# Patient Record
Sex: Female | Born: 1940 | Race: White | Hispanic: No | Smoking: Never smoker
Health system: Southern US, Community
[De-identification: ages and names within clinical notes are randomized; demographics above are authoritative.]

## PROBLEM LIST (undated history)

## (undated) DIAGNOSIS — Z8669 Personal history of other diseases of the nervous system and sense organs: Secondary | ICD-10-CM

## (undated) DIAGNOSIS — M62838 Other muscle spasm: Secondary | ICD-10-CM

## (undated) DIAGNOSIS — E785 Hyperlipidemia, unspecified: Secondary | ICD-10-CM

## (undated) DIAGNOSIS — Z960 Presence of urogenital implants: Secondary | ICD-10-CM

## (undated) DIAGNOSIS — E119 Type 2 diabetes mellitus without complications: Secondary | ICD-10-CM

## (undated) DIAGNOSIS — M542 Cervicalgia: Secondary | ICD-10-CM

## (undated) DIAGNOSIS — D049 Carcinoma in situ of skin, unspecified: Secondary | ICD-10-CM

## (undated) DIAGNOSIS — Z87898 Personal history of other specified conditions: Secondary | ICD-10-CM

## (undated) DIAGNOSIS — I10 Essential (primary) hypertension: Secondary | ICD-10-CM

## (undated) DIAGNOSIS — F32A Depression, unspecified: Secondary | ICD-10-CM

## (undated) DIAGNOSIS — M8580 Other specified disorders of bone density and structure, unspecified site: Secondary | ICD-10-CM

## (undated) DIAGNOSIS — M5416 Radiculopathy, lumbar region: Secondary | ICD-10-CM

## (undated) DIAGNOSIS — D099 Carcinoma in situ, unspecified: Secondary | ICD-10-CM

## (undated) DIAGNOSIS — I2510 Atherosclerotic heart disease of native coronary artery without angina pectoris: Secondary | ICD-10-CM

## (undated) DIAGNOSIS — Z9289 Personal history of other medical treatment: Secondary | ICD-10-CM

## (undated) DIAGNOSIS — I219 Acute myocardial infarction, unspecified: Secondary | ICD-10-CM

## (undated) DIAGNOSIS — B029 Zoster without complications: Secondary | ICD-10-CM

## (undated) DIAGNOSIS — C801 Malignant (primary) neoplasm, unspecified: Secondary | ICD-10-CM

## (undated) DIAGNOSIS — G250 Essential tremor: Secondary | ICD-10-CM

## (undated) DIAGNOSIS — R634 Abnormal weight loss: Secondary | ICD-10-CM

## (undated) DIAGNOSIS — I700 Atherosclerosis of aorta: Secondary | ICD-10-CM

## (undated) DIAGNOSIS — Z8249 Family history of ischemic heart disease and other diseases of the circulatory system: Secondary | ICD-10-CM

## (undated) DIAGNOSIS — K683 Retroperitoneal hematoma: Secondary | ICD-10-CM

## (undated) DIAGNOSIS — L858 Other specified epidermal thickening: Secondary | ICD-10-CM

## (undated) DIAGNOSIS — K661 Hemoperitoneum: Secondary | ICD-10-CM

## (undated) DIAGNOSIS — K5900 Constipation, unspecified: Secondary | ICD-10-CM

## (undated) DIAGNOSIS — K222 Esophageal obstruction: Secondary | ICD-10-CM

## (undated) DIAGNOSIS — C4491 Basal cell carcinoma of skin, unspecified: Secondary | ICD-10-CM

## (undated) DIAGNOSIS — K219 Gastro-esophageal reflux disease without esophagitis: Secondary | ICD-10-CM

## (undated) DIAGNOSIS — N814 Uterovaginal prolapse, unspecified: Secondary | ICD-10-CM

## (undated) HISTORY — DX: Other muscle spasm: M62.838

## (undated) HISTORY — DX: Other specified disorders of bone density and structure, unspecified site: M85.80

## (undated) HISTORY — DX: Basal cell carcinoma of skin, unspecified: C44.91

## (undated) HISTORY — PX: CATARACT EXTRACTION, BILATERAL: SHX1313

## (undated) HISTORY — DX: Depression, unspecified: F32.A

## (undated) HISTORY — DX: Atherosclerotic heart disease of native coronary artery without angina pectoris: I25.10

## (undated) HISTORY — DX: Retroperitoneal hematoma: K68.3

## (undated) HISTORY — DX: Hemoperitoneum: K66.1

## (undated) HISTORY — DX: Hyperlipidemia, unspecified: E78.5

## (undated) HISTORY — PX: TUBAL LIGATION: SHX77

## (undated) HISTORY — PX: TONSILECTOMY/ADENOIDECTOMY WITH MYRINGOTOMY: SHX6125

## (undated) HISTORY — DX: Acute myocardial infarction, unspecified: I21.9

## (undated) HISTORY — DX: Atherosclerosis of aorta: I70.0

## (undated) HISTORY — DX: Carcinoma in situ, unspecified: D09.9

## (undated) HISTORY — DX: Radiculopathy, lumbar region: M54.16

## (undated) HISTORY — DX: Other specified epidermal thickening: L85.8

## (undated) HISTORY — DX: Presence of urogenital implants: Z96.0

## (undated) HISTORY — DX: Uterovaginal prolapse, unspecified: N81.4

## (undated) HISTORY — DX: Cervicalgia: M54.2

## (undated) HISTORY — DX: Type 2 diabetes mellitus without complications: E11.9

## (undated) HISTORY — DX: Personal history of other specified conditions: Z87.898

## (undated) HISTORY — DX: Constipation, unspecified: K59.00

## (undated) HISTORY — DX: Esophageal obstruction: K22.2

## (undated) HISTORY — DX: Abnormal weight loss: R63.4

## (undated) HISTORY — DX: Gastro-esophageal reflux disease without esophagitis: K21.9

## (undated) HISTORY — DX: Family history of ischemic heart disease and other diseases of the circulatory system: Z82.49

## (undated) HISTORY — DX: Personal history of other diseases of the nervous system and sense organs: Z86.69

## (undated) HISTORY — PX: DILATION AND CURETTAGE OF UTERUS: SHX78

## (undated) HISTORY — PX: COLONOSCOPY: SHX174

## (undated) HISTORY — DX: Carcinoma in situ of skin, unspecified: D04.9

## (undated) HISTORY — DX: Essential (primary) hypertension: I10

---

## 1993-06-24 DIAGNOSIS — C4491 Basal cell carcinoma of skin, unspecified: Secondary | ICD-10-CM

## 1993-06-24 HISTORY — DX: Basal cell carcinoma of skin, unspecified: C44.91

## 1996-07-11 DIAGNOSIS — D049 Carcinoma in situ of skin, unspecified: Secondary | ICD-10-CM

## 1996-07-11 HISTORY — DX: Carcinoma in situ of skin, unspecified: D04.9

## 2001-03-09 ENCOUNTER — Other Ambulatory Visit: Admission: RE | Admit: 2001-03-09 | Discharge: 2001-03-09 | Payer: Self-pay | Admitting: Obstetrics and Gynecology

## 2001-09-11 ENCOUNTER — Ambulatory Visit (HOSPITAL_COMMUNITY): Admission: RE | Admit: 2001-09-11 | Discharge: 2001-09-11 | Payer: Self-pay | Admitting: Family Medicine

## 2001-09-11 ENCOUNTER — Encounter: Payer: Self-pay | Admitting: Family Medicine

## 2002-03-11 ENCOUNTER — Ambulatory Visit (HOSPITAL_COMMUNITY): Admission: RE | Admit: 2002-03-11 | Discharge: 2002-03-11 | Payer: Self-pay | Admitting: Family Medicine

## 2002-03-11 ENCOUNTER — Encounter: Payer: Self-pay | Admitting: Family Medicine

## 2002-04-18 ENCOUNTER — Other Ambulatory Visit: Admission: RE | Admit: 2002-04-18 | Discharge: 2002-04-18 | Payer: Self-pay | Admitting: Obstetrics and Gynecology

## 2003-04-25 ENCOUNTER — Other Ambulatory Visit: Admission: RE | Admit: 2003-04-25 | Discharge: 2003-04-25 | Payer: Self-pay | Admitting: Obstetrics and Gynecology

## 2004-04-27 ENCOUNTER — Other Ambulatory Visit: Admission: RE | Admit: 2004-04-27 | Discharge: 2004-04-27 | Payer: Self-pay | Admitting: Obstetrics and Gynecology

## 2004-05-25 ENCOUNTER — Ambulatory Visit (HOSPITAL_COMMUNITY): Admission: RE | Admit: 2004-05-25 | Discharge: 2004-05-25 | Payer: Self-pay | Admitting: Family Medicine

## 2004-06-13 DIAGNOSIS — I219 Acute myocardial infarction, unspecified: Secondary | ICD-10-CM

## 2004-06-13 HISTORY — DX: Acute myocardial infarction, unspecified: I21.9

## 2004-11-05 ENCOUNTER — Inpatient Hospital Stay (HOSPITAL_COMMUNITY): Admission: EM | Admit: 2004-11-05 | Discharge: 2004-11-11 | Payer: Self-pay | Admitting: Emergency Medicine

## 2004-11-05 HISTORY — PX: CORONARY ANGIOPLASTY WITH STENT PLACEMENT: SHX49

## 2004-11-29 ENCOUNTER — Encounter: Admission: RE | Admit: 2004-11-29 | Discharge: 2005-02-27 | Payer: Self-pay | Admitting: Cardiology

## 2004-12-02 ENCOUNTER — Encounter (HOSPITAL_COMMUNITY): Admission: RE | Admit: 2004-12-02 | Discharge: 2005-01-01 | Payer: Self-pay | Admitting: Cardiology

## 2004-12-29 ENCOUNTER — Encounter (HOSPITAL_COMMUNITY): Admission: RE | Admit: 2004-12-29 | Discharge: 2005-03-29 | Payer: Self-pay | Admitting: Cardiology

## 2005-01-03 ENCOUNTER — Encounter (HOSPITAL_COMMUNITY): Admission: RE | Admit: 2005-01-03 | Discharge: 2005-02-02 | Payer: Self-pay | Admitting: Cardiology

## 2005-02-04 ENCOUNTER — Encounter (HOSPITAL_COMMUNITY): Admission: RE | Admit: 2005-02-04 | Discharge: 2005-03-06 | Payer: Self-pay | Admitting: Cardiology

## 2005-03-07 ENCOUNTER — Encounter (HOSPITAL_COMMUNITY): Admission: RE | Admit: 2005-03-07 | Discharge: 2005-03-11 | Payer: Self-pay | Admitting: Cardiology

## 2006-06-27 ENCOUNTER — Emergency Department (HOSPITAL_COMMUNITY): Admission: EM | Admit: 2006-06-27 | Discharge: 2006-06-27 | Payer: Self-pay | Admitting: Emergency Medicine

## 2007-02-27 ENCOUNTER — Ambulatory Visit (HOSPITAL_COMMUNITY): Admission: RE | Admit: 2007-02-27 | Discharge: 2007-02-27 | Payer: Self-pay | Admitting: Family Medicine

## 2008-11-27 ENCOUNTER — Ambulatory Visit (HOSPITAL_COMMUNITY): Admission: RE | Admit: 2008-11-27 | Discharge: 2008-11-27 | Payer: Self-pay | Admitting: Family Medicine

## 2010-10-29 NOTE — Consult Note (Signed)
[name], [name] NO.:  [id number]   MEDICAL RECORD NO.:  [id number]          PATIENT TYPE:  INP   LOCATION:  2927                         FACILITY:  [facility]   PHYSICIAN:  Di Kindle. Edilia Bo, M.D.DATE OF BIRTH:  1940-12-02   DATE OF CONSULTATION:  11/06/2004  DATE OF DISCHARGE:                                   CONSULTATION   REFERRING PHYSICIAN:  Mohan N. [name], M.D.   REASON FOR CONSULTATION:  Retroperitoneal hematoma.   HISTORY:  This is a pleasant 70 year old woman who was admitted with an  acute coronary syndrome.  She had an acute MI and was taken to the cath lab  Nov 04, 2004 underwent cardiac catheterization and PTCA via a right femoral  approach.  She later had a drop in her blood pressure and also was noted to  have a drop her hemoglobin from 12.6 to 8.8.  CT scan of the abdomen was  obtained which showed a moderate-sized right retroperitoneal hematoma.  Vascular surgery was consulted for further recommendations.   The patient does complain of some mild-to-moderate right lower quadrant pain  and also has some nausea.   PAST MEDICAL HISTORY:  Significant for hypertension and coronary artery  disease.  She denies any history of diabetes, hypercholesterolemia, history  of congestive heart failure.   PAST SURGICAL HISTORY:  Significant for tonsillectomy and tubal ligation.   ALLERGIES:  SULFA.   MEDICATIONS:  Atenolol.   SOCIAL HISTORY:  The patient is married and has 4 children.  She smokes  greater than pack per day for 40 years.   FAMILY HISTORY:  There is no history of premature cardiovascular disease.   PHYSICAL EXAMINATION:  VITAL SIGNS:  Blood pressure is 109/60, heart rate is  80.  HEENT:  There is no cervical lymphadenopathy.  I do not detect any carotid  bruits.  LUNGS:  Clear bilaterally to auscultation.  CARDIAC:  She has a regular rate and rhythm.  ABDOMEN:  Slightly distended and mildly tender in the right lower  quadrant.  EXTREMITIES:  She has palpable femoral pulses and palpable dorsalis pedis  pulses bilaterally.  She has no significant lower extremity swelling.   LABORATORY DATA:  Hemoglobin is 8.8, platelets are 303,000, hemoglobin is  8.8 and hematocrit is 26.1.  Prior to the procedure, H&H were 12.6 and 37.  Coagulations were normal prior to the procedure at INR 1.1 and a PTT of  greater than 200, although she was on heparin.  Of note, the patient is  receiving Plavix also.   IMPRESSION:  This patient has a moderate-sized right retroperitoneal  hematoma.  I agree with the continued resuscitation with blood products as  needed.  She is currently receiving 2 units of packed red blood cells and  would recheck her hemoglobin and hematocrit after that and also recheck her  coagulations, and follow her coagulations and platelet count.  I would  continue to resuscitate her with blood as needed.  I have explained to the  family that it is rarely that these require operative intervention and these  are usually followed  with continued resuscitation and  following her exam.  I certainly would like to avoid an extensive exploratory laparotomy if  needed, given her recent acute myocardial infarction and coronary  intervention.  I would be as careful with anticoagulants as possible for  now.      CSD/MEDQ  D:  11/06/2004  T:  11/06/2004  Job:  [id number]

## 2010-10-29 NOTE — Cardiovascular Report (Signed)
[name] Wright, [name] Wright                ACCOUNT NO.:  [id number]   MEDICAL RECORD NO.:  [id number]          PATIENT TYPE:  INP   LOCATION:  2927                         FACILITY:  [facility]   PHYSICIAN:  [name] Wright, M.D. DATE OF BIRTH:  05/22/1941   DATE OF PROCEDURE:  11/05/2004  DATE OF DISCHARGE:                              CARDIAC CATHETERIZATION   PROCEDURE:  1.  Left cardiac catheterization with selective left and right coronary      angiography, left ventriculography via right groin using Judkins      technique.  2.  Successful percutaneous transluminal coronary angioplasty to 100%      occluded mid right coronary artery using. 3.0 x 15-mm-long Maverick      balloon.  3.  Successful deployment of 3.5 x 28-mm-long CYPHER drug-eluting stent in      mid right coronary artery.  4.  Successful insertion of temporary transvenous pacemaker via right      femoral venous approach.   INDICATIONS FOR PROCEDURE:  [name] is a 70 year old white female with a  past medical history significant for hypertension, history of tobacco abuse,  hypercholesteremia, not on any medication, non-insulin-dependent diabetes  mellitus, recently diagnosed, controlled by diet.  She came to the ER via  EMS complaining of retrosternal chest pressure radiating to the left arm  associated with nausea and diaphoresis.  Denies any shortness of breath or  palpitations.  She stated she had similar chest pain yesterday while walking  on the treadmill at [facility] lasting a few minutes.  Today, around 6:30 a.m.,  developed severe chest pain, so called EMS.  EKG done in the ER showed  normal sinus rhythm with Q-waves, with ST elevation and II, III, aVF, V5, V6  and ST depression in V1 and V2 with rest focal changes and I and aVL  suggestive of posterolateral wall MI.  Due to typical anginal chest pain and  EKG changes, I discussed with the patient regarding emergency left cath plus  possible PTCA stenting , its risks  and benefits, i.e., death, MI, stroke,  need for emergency CABG, local vascular complications, risk of restenosis,  etc, and consented for the procedure.   PROCEDURE:  After obtaining the informed consent, the patient was brought to  the cath lab and was placed on the fluoroscopy table.  The right groin was  prepped and draped in the usual fashion.  2% Xylocaine was used for local  anesthesia in the right groin.  With the help of a thin-walled needle, 7-  French arterial and 6-French venous sheaths were placed.  Both the sheaths  aspirated and flushed.  Next, a 6-French left Judkins catheter was advanced  over the wire under fluoroscopic guidance into the ascending aorta.  Wire  was pulled out and the catheter was aspirated and connected to the manifold.  Catheter was further advanced and engaged into left coronary ostium.  Multiple views of the left system were taken.  Next, the catheter was  disengaged and was pulled out over the wire and was replaced with a 6-French  right Judkins catheter, which  was advanced over the wire under fluoroscopic  guidance up to the ascending aorta.  Wire was pulled out and the catheter  was aspirated and connected to the manifold.  Catheter was further advanced  and engaged into the right coronary ostium.  Multiple views of the left  system were taken.  Next, the catheter was disengaged and was pulled out  over the wire and was replaced with a 6-French pigtail catheter at the end  of procedure, which was advanced over the wire under fluoroscopic guidance  up to the ascending aorta.  Wire was pulled out and the catheter was  aspirated and connected to the manifold.  Catheter was further advanced  across aortic valve into the LV.  LV pressures were recorded.  Next, left  ventriculography was done in a 30-degree RAO position.  Post-angiographic  pressures were recorded from LV and then pullback pressures were recorded  from the aorta.  There was no gradient  across aortic valve.  Next, the  pigtail catheter was pulled out over the wire, sheaths aspirated and  flushed.   FINDINGS:  1.  LV showed mild inferior wall hypokinesia, EF of approximately50% to 55%.  2.  Left main was short, which was patent.  3.  LAD has 70% to 75% ostial and proximal stenosis which appears in RAO      caudal position; in LAO cranial position, the lesion appears in the      range of 40% to 50% and has 15% to 20% mid-stenosis.  Diagonal 1 and      diagonal 2 were very small.  4.  Left circumflex has 30% to 40% midstenosis with haziness.  OM-1 to OM-3      were very small, less than 0.5 mm.  OM-4 has 50% to 60% ostial stenosis.  5.  RCA was 100% occluded beyond the mid-portion.  Temporary transvenous      pacemaker was inserted prior to PCI to RCA without difficulty.   PERCUTANEOUS TRANSLUMINAL CORONARY ANGIOPLASTY PROCEDURAL NOTE:  Successful  PTCA to mid RCA was done using 3.0 x 15-mm-long Maverick balloon for  predilatation and then 3.5 x 28-mm-long CYPHER drug-eluting stent was  deployed at 11 atmospheric pressure, which was fully expanded, going up to  14 atmospheric pressure.  The lesion was dilated from 100% to 0% residual  with excellent TIMI grade 3 distal flow without evidence of dissection or  distal embolization.  The patient had episode of hypotension and severe  bradycardia after first balloon inflation, requiring fluid challenge to come  in and had good capture with the temporary pacer.  Pacemaker was inserted  prior to PCI, as above, which was discontinued at the end of the procedure.  The patient received weight-based heparin, Integrilin and 600  mg of Plavix,  and intracoronary nitro during the procedure.  The patient tolerated  procedure well.  Dopamine was also discontinued at the end of the procedure.  The patient tolerated procedure well.  There are no complications.  The  patient was transferred to recovery room in stable  condition.     MNH/MEDQ  D:  11/05/2004  T:  11/06/2004  Job:  [id number]   cc:   [facility]

## 2010-10-29 NOTE — Discharge Summary (Signed)
[name], [name] Wright                ACCOUNT NO.:  [id number]   MEDICAL RECORD NO.:  [id number]          PATIENT TYPE:  INP   LOCATION:  2009                         FACILITY:  [facility]   PHYSICIAN:  Eduardo Osier. [name], M.D. DATE OF BIRTH:  August 26, 1940   DATE OF ADMISSION:  11/05/2004  DATE OF DISCHARGE:  11/11/2004                                 DISCHARGE SUMMARY   ADMISSION DIAGNOSES:  1.  Acute inferior posterolateral wall myocardial infarction.  2.  Hypertension.  3.  History of passive tobacco abuse.   FINAL DIAGNOSES:  1.  Status post acute inferoposterior myocardial infarction, status post      percutaneous transluminal coronary angiography and stenting to 100%      occluded right coronary artery.  2.  Status post retroperitoneal hematoma.  3.  Status post left subconjunctival hemorrhage, stable.  4.  Hypertension.  5.  New onset diabetes mellitus controlled by diet.  6.  Hypercholesterolemia.  7.  Status post anemia secondary to retroperitoneal bleed.  8.  History of passive tobacco abuse.  9.  Two-vessel coronary artery disease.   DISCHARGE HOME MEDICATIONS:  1.  Baby aspirin 81 mg two tablets daily.  2.  Plavix 75 mg one tablet daily with food.  3.  Toprol XL 50 mg one tablet daily.  4.  Altace 2.5 mg one capsule daily.  5.  Lipitor 20 mg one tablet daily.  6.  Protonix 40 mg one tablet daily.  7.  Nitrostat 0.4 mg sublingual to use as directed.   ACTIVITY:  Avoid heavy lifting, pushing or pulling.   DIET:  Low-salt, low-cholesterol, 1800 calorie, ADA diet.  The patient has  been advised to avoid sweets.   WOUND CARE:  Post angioplasty and stent instructions have been given.   FOLLOWUP:  CBC and BMET in one week.  Follow up with me in one week.   CONDITION AT DISCHARGE:  Stable.   BRIEF HISTORY:  Ms. [name] Wright is a 70 year old white female with past medical  history significant for hypertension and history of passive tobacco abuse.  She came to the ER via EMS,  complaining of retrosternal chest pressure  radiating to the left arm associated with nausea and diaphoresis.  Denies  any shortness of breath and palpations.  States she had similar chest pain  yesterday while walking on treadmill, which lasted a few minutes at the  [facility].  Again today around 7:30 a.m., developed severe chest pain, so called  EMS.  EKG done in the ER showed normal sinus rhythm with Q waves and ST  elevated in 2, 3, aVF, V5 and V6, ST depression in V1 and V2 and reciprocal  changes in 1 and aVL suggestive of inferior posterolateral MI.   PAST MEDICAL HISTORY:  As above.   PAST SURGICAL HISTORY:  1.  She had tonsillectomy many years ago.  2.  She had tubal ligation many years ago.   ALLERGIES:  She is allergic to SULFA.   MEDICATIONS:  At home she was on atenolol 25 mg half of a tablet daily.   SOCIAL HISTORY:  She is married and has four children.  No history of  smoking or alcohol abuse, but her husband smokes more than one pack per day  for 40+ years.   FAMILY HISTORY:  Positive for coronary artery disease.   PHYSICAL EXAMINATION:  GENERAL APPEARANCE:  She was alert, awake and  oriented x 3.  In no acute distress.  VITAL SIGNS:  The blood pressure is 130/74.  The pulse was 62 and regular.  HEENT:  The conjunctivae were pink.  NECK:  Supple.  No JVD.  No bruits.  LUNGS:  Clear to auscultation.  CARDIOVASCULAR:  S1 and S2 were normal.  There was a soft S4 gallop.  ABDOMEN:  Soft.  Bowel sounds were present and nontender.  EXTREMITIES:  There is no cyanosis, clubbing or edema.   LABORATORIES:  The cholesterol was 128, LDL was 71 and HDL was low at 29.  Her CK was 50 with MB of 0.9.  The second set CK was 1433 with MB 96.  The  third set CK was 1002 with MB 73.8 and relative index 7.4.  The fourth set  CK was 117 with MB 2.9.  Troponin I's were 0.02, 41.93, 39.41, 4.78 and  today is 1.06.  Her sodium was 138, potassium 3.6, chloride 107, glucose  200, BUN 8 and  creatinine 0.8.  Repeat blood sugar was 186.  Repeat fasting  blood sugar on Nov 10, 2004, was 110, BUN 5 and creatinine 0.7.  Her AST was  97.  Repeat AST was 55 on Nov 07, 2004.  Her total bilirubin was 1.9 on Nov 06, 2004.  Repeat total bilirubin was 0.5.  The hemoglobin A1c was 5.7.  Her  hemoglobin was 12.1, hematocrit 35.8 and white count 8.2.  On Nov 05, 2004,  the repeat hemoglobin was 8.8 and hematocrit 36.1.  The patient received a  total of four units of packed red blood cells on Nov 10, 2004.  The  hemoglobin was 12.4 and hematocrit 36.9.  Today the hemoglobin was 13.6 and  hematocrit 39.6, which has been stable.   BRIEF HOSPITAL COURSE:  The patient was directly taken to the  catheterization laboratory from the ER and underwent left cardiac  catheterization with selective left and right coronary angiography and PTCA  stenting to 100% occluded RCA.  As per procedure report, the patient  tolerated the procedure well.  There were no complications.  Post procedure  after sheath removal, the patient became hypotensive and repeat laboratories  done showed a significant drop in the hemoglobin.  She subsequently  underwent limited CT of the abdomen and pelvis which showed right  retroperitoneal hematoma.  The patient received a total of four units of  packed rbc's with stabilization of her hemoglobin.  Her groin was stable.  There was no obvious evidence of superficial hematoma or pseudoaneurysm.  The patient did no have any episodes of chest pain during the hospital stay.  Phase 1 cardiac rehabilitation was initiated.  The patient  has been  ambulating in the hallway without any problems.  A vascular surgical consult  was also obtained with Dr. Edilia Bo and the plan was to treat conservatively.  The patient did not  have any further evidence of active bleeding.  The patient remained  hemodynamically stable during the hospital stay.  The patient will be discharged home on the above  medications and will be followed up in my  office in one week.      MNH/MEDQ  D:  11/11/2004  T:  11/11/2004  Job:  [id number]

## 2011-01-05 DIAGNOSIS — D099 Carcinoma in situ, unspecified: Secondary | ICD-10-CM

## 2011-01-05 DIAGNOSIS — L858 Other specified epidermal thickening: Secondary | ICD-10-CM

## 2011-01-05 HISTORY — DX: Carcinoma in situ, unspecified: D09.9

## 2011-01-05 HISTORY — DX: Other specified epidermal thickening: L85.8

## 2011-08-02 ENCOUNTER — Other Ambulatory Visit (HOSPITAL_COMMUNITY): Payer: Self-pay | Admitting: Internal Medicine

## 2011-08-02 DIAGNOSIS — Z139 Encounter for screening, unspecified: Secondary | ICD-10-CM

## 2011-08-05 ENCOUNTER — Other Ambulatory Visit (HOSPITAL_COMMUNITY): Payer: Self-pay

## 2011-08-09 ENCOUNTER — Ambulatory Visit (HOSPITAL_COMMUNITY)
Admission: RE | Admit: 2011-08-09 | Discharge: 2011-08-09 | Disposition: A | Discharge status: Home / Self Care | Payer: Medicare Other | Source: Ambulatory Visit | Attending: Internal Medicine | Admitting: Internal Medicine

## 2011-08-09 DIAGNOSIS — M899 Disorder of bone, unspecified: Secondary | ICD-10-CM | POA: Diagnosis not present

## 2011-08-09 DIAGNOSIS — M949 Disorder of cartilage, unspecified: Secondary | ICD-10-CM | POA: Insufficient documentation

## 2011-08-09 DIAGNOSIS — Z780 Asymptomatic menopausal state: Secondary | ICD-10-CM | POA: Insufficient documentation

## 2011-08-09 DIAGNOSIS — Z13820 Encounter for screening for osteoporosis: Secondary | ICD-10-CM | POA: Insufficient documentation

## 2011-08-09 DIAGNOSIS — Z139 Encounter for screening, unspecified: Secondary | ICD-10-CM

## 2011-09-20 DIAGNOSIS — E782 Mixed hyperlipidemia: Secondary | ICD-10-CM | POA: Diagnosis not present

## 2011-11-21 DIAGNOSIS — R7309 Other abnormal glucose: Secondary | ICD-10-CM | POA: Diagnosis not present

## 2012-10-23 DIAGNOSIS — Z79899 Other long term (current) drug therapy: Secondary | ICD-10-CM | POA: Diagnosis not present

## 2012-10-25 ENCOUNTER — Telehealth: Payer: Self-pay | Admitting: Cardiovascular Disease

## 2012-10-25 NOTE — Telephone Encounter (Signed)
Per answering service-pt called about change in medication!

## 2012-10-26 ENCOUNTER — Telehealth: Payer: Self-pay | Admitting: Cardiovascular Disease

## 2012-10-26 NOTE — Telephone Encounter (Signed)
Her primary care doctor wants to change her cholesterol medicine-she needs to talk to you about this!

## 2012-10-26 NOTE — Telephone Encounter (Signed)
Returned call.  Pt stated she went to her PCP and they did blood work.  Stated he wants to change her cholesterol medicine and pt wants to know if she can stay on what she's on because she likes it.   Stated she wants to know what Dr. Allyson Sabal thinks about that.  Pt informed her PCP would change her medicine if she is taking it as directed and her cholesterol is still elevated.  Pt advised to contact pcp r/t her concerns w/ her meds.  Pt verbalized understanding and agreed w/ plan.

## 2012-10-29 ENCOUNTER — Other Ambulatory Visit: Payer: Self-pay | Admitting: Pharmacist

## 2012-10-29 MED ORDER — METOPROLOL SUCCINATE ER 50 MG PO TB24: 50.0000 mg | ORAL_TABLET | Freq: Every day | ORAL | Status: DC

## 2012-10-29 MED ORDER — PRAVASTATIN SODIUM 20 MG PO TABS: 20.0000 mg | ORAL_TABLET | Freq: Every evening | ORAL | Status: DC

## 2012-10-29 MED ORDER — NITROGLYCERIN 0.4 MG SL SUBL: 0.4000 mg | SUBLINGUAL_TABLET | SUBLINGUAL | Status: DC | PRN

## 2012-11-20 ENCOUNTER — Ambulatory Visit: Payer: Medicare Other | Admitting: Cardiology

## 2012-11-22 ENCOUNTER — Ambulatory Visit (INDEPENDENT_AMBULATORY_CARE_PROVIDER_SITE_OTHER): Payer: Medicare Other | Admitting: Cardiology

## 2012-11-22 ENCOUNTER — Encounter: Payer: Self-pay | Admitting: Cardiovascular Disease

## 2012-11-22 ENCOUNTER — Encounter: Payer: Self-pay | Admitting: Cardiology

## 2012-11-22 VITALS — BP 142/70 | HR 63 | Ht 62.0 in | Wt 141.2 lb

## 2012-11-22 DIAGNOSIS — I10 Essential (primary) hypertension: Secondary | ICD-10-CM | POA: Diagnosis not present

## 2012-11-22 DIAGNOSIS — I2510 Atherosclerotic heart disease of native coronary artery without angina pectoris: Secondary | ICD-10-CM

## 2012-11-22 DIAGNOSIS — E119 Type 2 diabetes mellitus without complications: Secondary | ICD-10-CM | POA: Insufficient documentation

## 2012-11-22 DIAGNOSIS — E785 Hyperlipidemia, unspecified: Secondary | ICD-10-CM | POA: Diagnosis not present

## 2012-11-22 NOTE — Assessment & Plan Note (Signed)
A little elevated today 142/76. She admits she has not been on a low sodium diet.

## 2012-11-22 NOTE — Patient Instructions (Addendum)
Low salt diet. Keep track of your B/P for the next 2 weeks. Let us know if you top number is greater than 140 on more than a rare occasion. You are due for fasting lipids and liver function tests in Sept. These can be done at Dr Fusco's office.

## 2012-11-22 NOTE — Assessment & Plan Note (Signed)
No angina 

## 2012-11-22 NOTE — Progress Notes (Signed)
11/22/2012 [name] Wright   21-Apr-1941  [id number]  Primary Physicia Cassell Smiles., MD Primary Cardiologist: Dr Allyson Sabal  HPI:  The patient is a 72 year old thin and frail-appearing divorced Caucasian female, mother of 5, grandmother of 5 grandchildren, who was last seen in the office 6 months ago. She has a history of CAD status post RCA stenting with a Cypher drug-eluting stent by Dr. Rinaldo Cloud in May of 2006. She did have residual disease in her LAD and circumflex coronary arteries. Her last Myoview, performed September 30, 2009, was nonischemic. She denies chest pain, edema, or shortness of breath. Her other problems include hypertension, hyperlipidemia, on Pravachol. She has noted that her B/P has been a little elevated at times. She admits she is not on a low sodium diet. She has had problems with several medications in the past but is able to tolerate her current regime.    Current Outpatient Prescriptions  Medication Sig Dispense Refill  . amLODipine (NORVASC) 2.5 MG tablet Take 2.5 mg by mouth daily.      . clopidogrel (PLAVIX) 75 MG tablet Take 75 mg by mouth every other day.      . Coenzyme Q10 (COQ10 PO) Take 1 capsule by mouth every other day.      . IRON PO Take by mouth 3 (three) times daily.      [name] loratadine (CLARITIN) 10 MG tablet Take 10 mg by mouth as needed for allergies.      . metoprolol succinate (TOPROL-XL) 50 MG 24 hr tablet Take 1 tablet (50 mg total) by mouth daily. Take with or immediately following a meal.  30 tablet  6  . nitroGLYCERIN (NITROSTAT) 0.4 MG SL tablet Place 1 tablet (0.4 mg total) under the tongue every 5 (five) minutes as needed for chest pain.  25 tablet  3  . Omega-3 Fatty Acids (FISH OIL PO) Take by mouth daily.      . pantoprazole (PROTONIX) 40 MG tablet Take 40 mg by mouth as needed.      . pravastatin (PRAVACHOL) 20 MG tablet Take 1 tablet (20 mg total) by mouth every evening.  30 tablet  6   No current facility-administered medications for  this visit.    Allergies  Allergen Reactions  . Altace (Ramipril) Other (See Comments)    Tremors and increased tinnitus  . Avelox (Moxifloxacin Hcl In Nacl) Nausea Only and Other (See Comments)    Throat and tongue swelling  . Hctz (Hydrochlorothiazide) Other (See Comments)    Palpitations and tremors  . Lisinopril Other (See Comments)    Headaches  . Losartan Other (See Comments)    Palpitations and tremors  . Statins   . Sulfa Antibiotics Rash    History   Social History  . Marital Status: Divorced    Spouse Name: N/A    Number of Children: N/A  . Years of Education: N/A   Occupational History  . Not on file.   Social History Main Topics  . Smoking status: Never Smoker   . Smokeless tobacco: Not on file  . Alcohol Use: No  . Drug Use: Not on file  . Sexually Active: Not on file   Other Topics Concern  . Not on file   Social History Narrative  . No narrative on file     Review of Systems: General: negative for chills, fever, night sweats or weight changes.  Cardiovascular: negative for chest pain, dyspnea on exertion, edema, orthopnea, palpitations, paroxysmal nocturnal dyspnea or shortness  of breath Dermatological: negative for rash Respiratory: negative for cough or wheezing Urologic: negative for hematuria Abdominal: negative for nausea, vomiting, diarrhea, bright red blood per rectum, melena, or hematemesis Neurologic: negative for visual changes, syncope, or dizziness All other systems reviewed and are otherwise negative except as noted above.    Blood pressure 142/70, pulse 63, height 5\' 2"  (1.575 m), weight 141 lb 3.2 oz (64.048 kg).  General appearance: alert, cooperative and no distress Lungs: clear to auscultation bilaterally Heart: regular rate and rhythm Extrem: No edema  EKG  EKG: normal EKG, normal sinus rhythm, unchanged from previous tracings, TWI V2 is old.  ASSESSMENT AND PLAN:   CAD- RCA DES 2006 No angina  Hypertension A  little elevated today 142/76. She admits she has not been on a low sodium diet.  Hyperlipidemia She is due for lipids, LFTs in Sept   PLAN  I have asked her to keep a log of her B/P over the next 2 weeks. I'm hesitant to increase her medications at this time secondary to her sensitivity to medications in the past. She promises to watch her salt intake. If her systolic B/P is averaging greater than 140 we should increase her Norvasc. She is due for lipids and LFTs in Sept.   Zackeriah Kissler KPA-C 11/22/2012 3:07 PM

## 2012-11-22 NOTE — Assessment & Plan Note (Signed)
She is due for lipids, LFTs in Sept

## 2012-11-23 ENCOUNTER — Telehealth: Payer: Self-pay | Admitting: Cardiovascular Disease

## 2012-11-23 NOTE — Telephone Encounter (Signed)
Returned call.  Pt stated she was told to monitor her BP for two weeks.  Stated her GYN took it while she was there took her BP and it was 152/62.  Stated she was told to call back if her top number was >150.  Pt. Informed LDiona Fanti, PA-C will be notified for further instructions.  Pt advised to continue current dose until further instructions given.  Pt verbalized understanding and agreed w/ plan.

## 2012-11-23 NOTE — Telephone Encounter (Signed)
Pt was here yesterday and saw Wilburt Finlay, PA.  Was told to recheck her blood pressure in about 2 weeks.  She went today for her check up and her bp was 152/62.  Please advise.

## 2012-12-23 ENCOUNTER — Other Ambulatory Visit: Payer: Self-pay | Admitting: Cardiovascular Disease

## 2012-12-24 ENCOUNTER — Other Ambulatory Visit: Payer: Self-pay

## 2012-12-24 MED ORDER — PANTOPRAZOLE SODIUM 40 MG PO TBEC: 40.0000 mg | DELAYED_RELEASE_TABLET | ORAL | Status: DC | PRN

## 2012-12-24 NOTE — Telephone Encounter (Signed)
Rx was sent to pharmacy electronically. 

## 2013-02-22 DIAGNOSIS — Z1212 Encounter for screening for malignant neoplasm of rectum: Secondary | ICD-10-CM | POA: Diagnosis not present

## 2013-02-22 DIAGNOSIS — N813 Complete uterovaginal prolapse: Secondary | ICD-10-CM | POA: Diagnosis not present

## 2013-04-29 ENCOUNTER — Other Ambulatory Visit: Payer: Self-pay | Admitting: *Deleted

## 2013-04-29 MED ORDER — AMLODIPINE BESYLATE 2.5 MG PO TABS: 2.5000 mg | ORAL_TABLET | Freq: Every day | ORAL | Status: DC

## 2013-04-29 MED ORDER — METOPROLOL SUCCINATE ER 50 MG PO TB24: 50.0000 mg | ORAL_TABLET | Freq: Every day | ORAL | Status: DC

## 2013-04-29 MED ORDER — PRAVASTATIN SODIUM 20 MG PO TABS: 20.0000 mg | ORAL_TABLET | Freq: Every evening | ORAL | Status: DC

## 2013-04-29 NOTE — Telephone Encounter (Signed)
Rx was sent to pharmacy electronically. 

## 2013-05-30 ENCOUNTER — Encounter: Payer: Self-pay | Admitting: Cardiovascular Disease

## 2013-05-30 ENCOUNTER — Ambulatory Visit (INDEPENDENT_AMBULATORY_CARE_PROVIDER_SITE_OTHER): Payer: Medicare Other | Admitting: Cardiovascular Disease

## 2013-05-30 VITALS — BP 108/60 | HR 72 | Ht 62.0 in | Wt 144.2 lb

## 2013-05-30 DIAGNOSIS — E785 Hyperlipidemia, unspecified: Secondary | ICD-10-CM

## 2013-05-30 DIAGNOSIS — I2510 Atherosclerotic heart disease of native coronary artery without angina pectoris: Secondary | ICD-10-CM

## 2013-05-30 DIAGNOSIS — I10 Essential (primary) hypertension: Secondary | ICD-10-CM

## 2013-05-30 DIAGNOSIS — Z79899 Other long term (current) drug therapy: Secondary | ICD-10-CM

## 2013-05-30 NOTE — Assessment & Plan Note (Signed)
History of RCA stenting by Dr. Rinaldo Cloud May 2006. She had residual LAD and circumflex disease. Her last Myoview performed 09/30/09 was nonischemic. She denies chest pain or shortness of breath.

## 2013-05-30 NOTE — Patient Instructions (Signed)
Your physician recommends that you schedule a follow-up appointment in: 6 months with [facility]  Your physician recommends that you schedule a follow-up appointment in: 1 year with Dr San Morelle will need FASTING lab work to be done

## 2013-05-30 NOTE — Assessment & Plan Note (Signed)
Well-controlled on current medications 

## 2013-05-30 NOTE — Progress Notes (Signed)
05/30/2013 [name] Wright   [name] Wright  [id number]  Primary Physician Cassell Smiles., MD Primary Cardiologist: Runell Gess MD [name]   HPI:  The patient is a 72 year old thin and frail-appearing divorced Caucasian female, mother of 5, grandmother of 5 grandchildren, who I last saw in the office 6 months ago. She has a history of CAD status post RCA stenting with a Cypher drug-eluting stent by Dr. Rinaldo Cloud in May of 2006. She did have residual disease in her LAD and circumflex coronary arteries. Her last Myoview, performed September 30, 2009, was nonischemic. She denies chest pain or shortness of breath. Her other problems include hypertension, hyperlipidemia, on Pravachol. Her most recent lab work performed in [name], 2013,revealed total cholesterol of 174, LDL of 79, and HDL of 45. She saw Corine Shelter back 11/22/12. She's done well since that time and is asymptomatic.    Current Outpatient Prescriptions  Medication Sig Dispense Refill  . amLODipine (NORVASC) 2.5 MG tablet Take 1 tablet (2.5 mg total) by mouth daily.  90 tablet  0  . clopidogrel (PLAVIX) 75 MG tablet TAKE 1 TABLET BY MOUTH EVERYDAY  90 tablet  2  . Coenzyme Q10 (COQ10 PO) Take 30 mg by mouth every other day.       . IRON PO Take 325 mg by mouth 2 (two) times daily.       [name] loratadine (CLARITIN) 10 MG tablet Take 10 mg by mouth as needed for allergies.      . metoprolol succinate (TOPROL-XL) 50 MG 24 hr tablet Take 1 tablet (50 mg total) by mouth daily. Take with or immediately following a meal.  90 tablet  0  . nitroGLYCERIN (NITROSTAT) 0.4 MG SL tablet Place 1 tablet (0.4 mg total) under the tongue every 5 (five) minutes as needed for chest pain.  25 tablet  3  . Omega-3 Fatty Acids (FISH OIL PO) Take 1,200 mg by mouth daily.       . pantoprazole (PROTONIX) 40 MG tablet Take 40 mg by mouth daily.      . pravastatin (PRAVACHOL) 20 MG tablet Take 1 tablet (20 mg total) by mouth every evening.   90 tablet  0   No current facility-administered medications for this visit.    Allergies  Allergen Reactions  . Altace [Ramipril] Other (See Comments)    Tremors and increased tinnitus  . Avelox [Moxifloxacin Hcl In Nacl] Nausea Only and Other (See Comments)    Throat and tongue swelling  . Hctz [Hydrochlorothiazide] Other (See Comments)    Palpitations and tremors  . Lisinopril Other (See Comments)    Headaches  . Losartan Other (See Comments)    Palpitations and tremors  . Statins   . Sulfa Antibiotics Rash    History   Social History  . Marital Status: Divorced    Spouse Name: N/A    Number of Children: N/A  . Years of Education: N/A   Occupational History  . Not on file.   Social History Main Topics  . Smoking status: Never Smoker   . Smokeless tobacco: Not on file  . Alcohol Use: No  . Drug Use: Not on file  . Sexual Activity: Not on file   Other Topics Concern  . Not on file   Social History Narrative  . No narrative on file     Review of Systems: General: negative for chills, fever, night sweats or weight changes.  Cardiovascular: negative for chest pain, dyspnea  on exertion, edema, orthopnea, palpitations, paroxysmal nocturnal dyspnea or shortness of breath Dermatological: negative for rash Respiratory: negative for cough or wheezing Urologic: negative for hematuria Abdominal: negative for nausea, vomiting, diarrhea, bright red blood per rectum, melena, or hematemesis Neurologic: negative for visual changes, syncope, or dizziness All other systems reviewed and are otherwise negative except as noted above.    Blood pressure 108/60, pulse 72, height 5\' 2"  (1.575 m), weight 144 lb 3.2 oz (65.409 kg).  General appearance: alert and no distress Neck: no adenopathy, no carotid bruit, no JVD, supple, symmetrical, trachea midline and thyroid not enlarged, symmetric, no tenderness/mass/nodules Lungs: clear to auscultation bilaterally Heart: regular rate  and rhythm, S1, S2 normal, no murmur, click, rub or gallop Extremities: extremities normal, atraumatic, no cyanosis or edema  EKG normal sinus rhythm at 72 with no ST or T wave changes. There were Q waves in leads II, III, and F V4 5 and 6 unchanged from a prior EKG  ASSESSMENT AND PLAN:   CAD- RCA DES 2006 History of RCA stenting by Dr. Rinaldo Cloud May 2006. She had residual LAD and circumflex disease. Her last Myoview performed 09/30/09 was nonischemic. She denies chest pain or shortness of breath.  Hypertension Well-controlled on current medications  Hyperlipidemia On statin therapy. We will recheck a repeat fasting lipid and liver profile      Runell Gess MD [facility], [facility] 05/30/2013 1:54 PM

## 2013-05-30 NOTE — Assessment & Plan Note (Signed)
On statin therapy. We will recheck a repeat fasting lipid and liver profile

## 2013-05-31 ENCOUNTER — Encounter: Payer: Self-pay | Admitting: Cardiovascular Disease

## 2013-06-10 ENCOUNTER — Other Ambulatory Visit: Payer: Self-pay | Admitting: Cardiovascular Disease

## 2013-06-10 NOTE — Telephone Encounter (Signed)
Rx was sent to pharmacy electronically. 

## 2013-06-11 ENCOUNTER — Telehealth: Payer: Self-pay | Admitting: Cardiovascular Disease

## 2013-06-11 NOTE — Telephone Encounter (Signed)
Please call-concerning her Metoprolol-The one she gets from [facility] her feel bad. [facility]

## 2013-06-11 NOTE — Telephone Encounter (Signed)
Returned call.  Pt stated she has everything straightened out.    [facility] yesterday.

## 2013-07-08 ENCOUNTER — Other Ambulatory Visit: Payer: Self-pay | Admitting: Cardiovascular Disease

## 2013-07-12 DIAGNOSIS — N812 Incomplete uterovaginal prolapse: Secondary | ICD-10-CM | POA: Diagnosis not present

## 2013-07-18 ENCOUNTER — Other Ambulatory Visit: Payer: Self-pay | Admitting: Cardiovascular Disease

## 2013-07-18 NOTE — Telephone Encounter (Signed)
Rx was sent to pharmacy electronically. 

## 2013-07-28 ENCOUNTER — Emergency Department (HOSPITAL_COMMUNITY)
Admission: EM | Admit: 2013-07-28 | Discharge: 2013-07-28 | Disposition: A | Discharge status: Home / Self Care | Payer: Medicare Other | Attending: Emergency Medicine | Admitting: Emergency Medicine

## 2013-07-28 ENCOUNTER — Encounter (HOSPITAL_COMMUNITY): Payer: Self-pay | Admitting: Emergency Medicine

## 2013-07-28 DIAGNOSIS — I2510 Atherosclerotic heart disease of native coronary artery without angina pectoris: Secondary | ICD-10-CM | POA: Diagnosis not present

## 2013-07-28 DIAGNOSIS — Z79899 Other long term (current) drug therapy: Secondary | ICD-10-CM | POA: Diagnosis not present

## 2013-07-28 DIAGNOSIS — R059 Cough, unspecified: Secondary | ICD-10-CM | POA: Diagnosis not present

## 2013-07-28 DIAGNOSIS — Z8669 Personal history of other diseases of the nervous system and sense organs: Secondary | ICD-10-CM | POA: Insufficient documentation

## 2013-07-28 DIAGNOSIS — Z87828 Personal history of other (healed) physical injury and trauma: Secondary | ICD-10-CM | POA: Insufficient documentation

## 2013-07-28 DIAGNOSIS — E860 Dehydration: Secondary | ICD-10-CM | POA: Diagnosis not present

## 2013-07-28 DIAGNOSIS — R112 Nausea with vomiting, unspecified: Secondary | ICD-10-CM

## 2013-07-28 DIAGNOSIS — Z7902 Long term (current) use of antithrombotics/antiplatelets: Secondary | ICD-10-CM | POA: Diagnosis not present

## 2013-07-28 DIAGNOSIS — Z9861 Coronary angioplasty status: Secondary | ICD-10-CM | POA: Diagnosis not present

## 2013-07-28 DIAGNOSIS — R6883 Chills (without fever): Secondary | ICD-10-CM | POA: Diagnosis not present

## 2013-07-28 DIAGNOSIS — I10 Essential (primary) hypertension: Secondary | ICD-10-CM | POA: Diagnosis not present

## 2013-07-28 DIAGNOSIS — R630 Anorexia: Secondary | ICD-10-CM | POA: Diagnosis not present

## 2013-07-28 DIAGNOSIS — R05 Cough: Secondary | ICD-10-CM | POA: Insufficient documentation

## 2013-07-28 DIAGNOSIS — E119 Type 2 diabetes mellitus without complications: Secondary | ICD-10-CM | POA: Insufficient documentation

## 2013-07-28 DIAGNOSIS — E785 Hyperlipidemia, unspecified: Secondary | ICD-10-CM | POA: Insufficient documentation

## 2013-07-28 DIAGNOSIS — R197 Diarrhea, unspecified: Secondary | ICD-10-CM | POA: Diagnosis not present

## 2013-07-28 DIAGNOSIS — IMO0001 Reserved for inherently not codable concepts without codable children: Secondary | ICD-10-CM | POA: Insufficient documentation

## 2013-07-28 LAB — COMPREHENSIVE METABOLIC PANEL
ALT: 26 U/L (ref 0–35)
AST: 25 U/L (ref 0–37)
Albumin: 3.9 g/dL (ref 3.5–5.2)
Alkaline Phosphatase: 66 U/L (ref 39–117)
CO2: 23 mEq/L (ref 19–32)
Calcium: 9.1 mg/dL (ref 8.4–10.5)
Chloride: 97 mEq/L (ref 96–112)
GFR calc non Af Amer: >90 mL/min (ref >90)
Glucose, Bld: 167 mg/dL — ABNORMAL HIGH (ref 70–99)
Potassium: 3.1 mEq/L — ABNORMAL LOW (ref 3.7–5.3)
Sodium: 140 mEq/L (ref 137–147)

## 2013-07-28 LAB — URINALYSIS, ROUTINE W REFLEX MICROSCOPIC
Bilirubin Urine: NEGATIVE
Glucose, UA: NEGATIVE mg/dL
Ketones, ur: >80 mg/dL — AB
Nitrite: NEGATIVE
Protein, ur: NEGATIVE mg/dL
Specific Gravity, Urine: 1.018 (ref 1.005–1.030)
Urobilinogen, UA: 0.2 mg/dL (ref 0.0–1.0)
pH: 6.5 (ref 5.0–8.0)

## 2013-07-28 LAB — CBC WITH DIFFERENTIAL/PLATELET
Basophils Absolute: 0 10*3/uL (ref 0.0–0.1)
Basophils Relative: 0 % (ref 0–1)
Eosinophils Absolute: 0 K/uL (ref 0.0–0.7)
Eosinophils Relative: 0 % (ref 0–5)
HCT: 41 % (ref 36.0–46.0)
Hemoglobin: 14.4 g/dL (ref 12.0–15.0)
Lymphocytes Relative: 15 % (ref 12–46)
Lymphs Abs: 1.5 10*3/uL (ref 0.7–4.0)
MCH: 31.2 pg (ref 26.0–34.0)
MCHC: 35.1 g/dL (ref 30.0–36.0)
MCV: 88.9 fL (ref 78.0–100.0)
Monocytes Absolute: 0.9 K/uL (ref 0.1–1.0)
Monocytes Relative: 8 % (ref 3–12)
Neutro Abs: 8 10*3/uL — ABNORMAL HIGH (ref 1.7–7.7)
Neutrophils Relative %: 77 % (ref 43–77)
Platelets: 296 10*3/uL (ref 150–400)
RBC: 4.61 MIL/uL (ref 3.87–5.11)
RDW: 13.4 % (ref 11.5–15.5)
WBC: 10.4 10*3/uL (ref 4.0–10.5)

## 2013-07-28 LAB — URINE MICROSCOPIC-ADD ON

## 2013-07-28 LAB — COMPREHENSIVE METABOLIC PANEL WITH GFR
BUN: 19 mg/dL (ref 6–23)
Creatinine, Ser: 0.54 mg/dL (ref 0.50–1.10)
GFR calc Af Amer: >90 mL/min (ref >90)
Total Bilirubin: 0.9 mg/dL (ref 0.3–1.2)
Total Protein: 7.4 g/dL (ref 6.0–8.3)

## 2013-07-28 MED ORDER — FAMOTIDINE 20 MG PO TABS: 20.0000 mg | ORAL_TABLET | Freq: Two times a day (BID) | ORAL | Status: DC

## 2013-07-28 MED ORDER — POTASSIUM CHLORIDE CRYS ER 20 MEQ PO TBCR
20.0000 meq | EXTENDED_RELEASE_TABLET | Freq: Once | ORAL | Status: AC
  Administered 2013-07-28: 20 meq via ORAL
  Filled 2013-07-28: qty 1

## 2013-07-28 MED ORDER — ONDANSETRON 8 MG PO TBDP: 8.0000 mg | ORAL_TABLET | Freq: Two times a day (BID) | ORAL | Status: DC | PRN

## 2013-07-28 MED ORDER — SODIUM CHLORIDE 0.9 % IV BOLUS (SEPSIS)
1000.0000 mL | Freq: Once | INTRAVENOUS | Status: AC
  Administered 2013-07-28: 1000 mL via INTRAVENOUS

## 2013-07-28 MED ORDER — PANTOPRAZOLE SODIUM 40 MG IV SOLR
40.0000 mg | Freq: Once | INTRAVENOUS | Status: AC
  Administered 2013-07-28: 40 mg via INTRAVENOUS
  Filled 2013-07-28: qty 40

## 2013-07-28 MED ORDER — ONDANSETRON HCL 4 MG/2ML IJ SOLN
4.0000 mg | Freq: Once | INTRAMUSCULAR | Status: AC
  Administered 2013-07-28: 4 mg via INTRAVENOUS
  Filled 2013-07-28: qty 2

## 2013-07-28 NOTE — ED Notes (Signed)
Pt informed that we need urine sample.  sts unable to pee yet.

## 2013-07-28 NOTE — ED Notes (Signed)
Asked pt to provide a urine specimen pt stated she could not provide one at this time.  

## 2013-07-28 NOTE — ED Provider Notes (Signed)
CSN: [id number]     Arrival date & time 07/28/13  [id number] History   First MD Initiated Contact with Patient 07/28/13 [phone]     Chief Complaint  Patient presents with  . Nausea  . Emesis  . Diarrhea     (Consider location/radiation/quality/duration/timing/severity/associated sxs/prior Treatment) Patient is a 73 y.o. female presenting with vomiting and diarrhea. The history is provided by the patient and a relative.  Emesis Severity:  Moderate Duration:  3 days Timing:  Constant Quality:  Stomach contents Progression:  Worsening Chronicity:  New Recent urination:  Decreased Context: not post-tussive and not self-induced   Relieved by:  Nothing Worsened by:  Liquids Ineffective treatments:  Antiemetics Associated symptoms: chills, diarrhea and myalgias   Associated symptoms: no abdominal pain, no fever and no URI   Associated symptoms comment:  Some cough, mainly only with gagging Diarrhea:    Quality:  Watery   Severity:  Severe   Duration:  3 days   Timing:  Intermittent   Progression:  Worsening Risk factors: sick contacts   Risk factors: no prior abdominal surgery, no suspect food intake and no travel to endemic areas   Risk factors comment:  No recent antibiotic use, was caring for daughter with similar symptoms Diarrhea Associated symptoms: chills, myalgias and vomiting   Associated symptoms: no abdominal pain, no fever and no URI     Past Medical History  Diagnosis Date  . Coronary artery disease     NUCLEAR STRESS TEST, 09/30/2009 - EKG negative for ischemia  . Hyperlipidemia   . Hypertension   . Diabetes mellitus without complication     Treated by diet  . H/O subconjunctival hemorrhage   . Retroperitoneal hematoma     S/P  . Family history of coronary artery disease     Mother died 71   Past Surgical History  Procedure Laterality Date  . Coronary angioplasty with stent placement  11/05/2004    RCA DES  . Dilation and curettage of uterus    . Tubal  ligation    . Tonsilectomy/adenoidectomy with myringotomy     Family History  Problem Relation Age of Onset  . Heart attack Mother 57  . Cancer Father     Colon  . Heart attack Maternal Grandmother 61   History  Substance Use Topics  . Smoking status: Never Smoker   . Smokeless tobacco: Not on file  . Alcohol Use: No   OB History   Grav Para Term Preterm Abortions TAB SAB Ect Mult Living                 Review of Systems  Constitutional: Positive for chills and appetite change. Negative for fever.  HENT: Negative for congestion.   Cardiovascular: Negative for chest pain.  Gastrointestinal: Positive for nausea, vomiting and diarrhea. Negative for abdominal pain.  Genitourinary: Negative for dysuria.  Musculoskeletal: Positive for myalgias.  All other systems reviewed and are negative.      Allergies  Altace; Avelox; Hctz; Lisinopril; Losartan; Statins; and Sulfa antibiotics  Home Medications   Current Outpatient Rx  Name  Route  Sig  Dispense  Refill  . amLODipine (NORVASC) 2.5 MG tablet   Oral   Take 2.5 mg by mouth daily.         . clopidogrel (PLAVIX) 75 MG tablet   Oral   Take 75 mg by mouth daily with breakfast.         . Coenzyme Q10 (COQ10 PO)   Oral  Take 30 mg by mouth every other day.          . IRON PO   Oral   Take 325 mg by mouth 2 (two) times daily.          [name] loratadine (CLARITIN) 10 MG tablet   Oral   Take 10 mg by mouth as needed for allergies.         . metoprolol succinate (TOPROL-XL) 50 MG 24 hr tablet   Oral   Take 1 tablet (50 mg total) by mouth daily. Take with or immediately following a meal.   90 tablet   0   . Omega-3 Fatty Acids (FISH OIL PO)   Oral   Take 1,200 mg by mouth daily.          . pantoprazole (PROTONIX) 40 MG tablet   Oral   Take 40 mg by mouth daily.         . pravastatin (PRAVACHOL) 20 MG tablet   Oral   Take 20 mg by mouth every evening.         . famotidine (PEPCID) 20 MG tablet    Oral   Take 1 tablet (20 mg total) by mouth 2 (two) times daily.   20 tablet   0   . nitroGLYCERIN (NITROSTAT) 0.4 MG SL tablet   Sublingual   Place 1 tablet (0.4 mg total) under the tongue every 5 (five) minutes as needed for chest pain.   25 tablet   3   . ondansetron (ZOFRAN-ODT) 8 MG disintegrating tablet   Oral   Take 1 tablet (8 mg total) by mouth every 12 (twelve) hours as needed for nausea.   20 tablet   0    BP 135/58  Pulse 77  Temp(Src) 97.2 F (36.2 C) (Oral)  Resp 16  Ht 5\' 2"  (1.575 m)  Wt 140 lb (63.504 kg)  BMI 25.60 kg/m2  SpO2 98% Physical Exam  Nursing note and vitals reviewed. Constitutional: She is oriented to person, place, and time. She appears well-developed and well-nourished. She is cooperative.  Non-toxic appearance. She does not have a sickly appearance. No distress.  HENT:  Head: Normocephalic and atraumatic.  Mouth/Throat: Oropharynx is clear and moist.  Eyes: Conjunctivae and EOM are normal. No scleral icterus.  Neck: Normal range of motion. Neck supple.  Cardiovascular: Normal rate, regular rhythm and intact distal pulses.   No murmur heard. Pulmonary/Chest: Effort normal. No respiratory distress. She has no wheezes. She has no rales.  Abdominal: Soft. She exhibits no distension. There is no tenderness. There is no rebound.  Neurological: She is alert and oriented to person, place, and time. She exhibits normal muscle tone. Coordination normal.  Skin: Skin is warm and dry. No rash noted.    ED Course  Procedures (including critical care time) Labs Review Labs Reviewed  CBC WITH DIFFERENTIAL - Abnormal; Notable for the following:    Neutro Abs 8.0 (*)    All other components within normal limits  COMPREHENSIVE METABOLIC PANEL - Abnormal; Notable for the following:    Potassium 3.1 (*)    Glucose, Bld 167 (*)    All other components within normal limits  URINALYSIS, ROUTINE W REFLEX MICROSCOPIC - Abnormal; Notable for the following:     APPearance CLOUDY (*)    Hgb urine dipstick MODERATE (*)    Ketones, ur >80 (*)    Leukocytes, UA LARGE (*)    All other components within normal limits  URINE MICROSCOPIC-ADD  ON - Abnormal; Notable for the following:    Squamous Epithelial / LPF FEW (*)    Bacteria, UA FEW (*)    All other components within normal limits   Imaging Review No results found.  EKG Interpretation   None      RA sat is 99% and I interpret to be normal  12:51 PM Pt feels improved, tolerating po liquids, has urinated. Will d/c home MDM   Final diagnoses:  Nausea vomiting and diarrhea  Dehydration    Pt with + sick contact, with 3 to 3.5 days of N/V/D, no pain.  Pt was called in phenergan by PCP office, but has been throwing up the tablets.  Unable to keep anything down.  No blood in stool.  Pt feels fatigued, but not light headed, no near syncope.  No, CP, abd pain.  Most likely gastroenteritis.  We have been seeing this frequently locally.  No prior surgeries.  Given age, will monitor carefully, labs to ensure no sig electrolyte derangement or renal failure, will hydrate and give IV meds for symptoms including protonix to reduce acid content to prevent gastritis.      Saddie Benders. Naydeline Morace, MD 07/28/13 1251

## 2013-07-28 NOTE — Discharge Instructions (Signed)
Dehydration, Elderly °Dehydration is when you lose more fluids from the body than you take in. Vital organs such as the kidneys, brain, and heart cannot function without a proper amount of fluids and salt. Any loss of fluids from the body can cause dehydration.  °Older adults are at a higher risk of dehydration than younger adults. As we age, our bodies are less able to conserve water and do not respond to temperature changes as well. Also, older adults do not become thirsty as easily or quickly. Because of this, older adults often do not realize they need to increase fluids to avoid dehydration.  °CAUSES  °· Vomiting. °· Diarrhea. °· Excessive sweating. °· Excessive urination. °· Fever. °· Certain medicines, such as blood pressure medicines called diuretics. °· Poorly controlled blood sugars. °SIGNS AND SYMPTOMS  °Mild dehydration: °· Thirst. °· Dry lips. °· Slightly dry mouth. °Moderate dehydration: °· Very dry mouth. °· Sunken eyes. °· Skin does not bounce back quickly when lightly pinched and released. °· Dark urine and decreased urine production. °· Decreased tear production. °· Headache. °Severe dehydration: °· Very dry mouth. °· Extreme thirst. °· Rapid, weak pulse (more than 100 beats per minute at rest). °· Cold hands and feet. °· Not able to sweat in spite of heat. °· Rapid breathing. °· Blue lips. °· Confusion and lethargy. °· Difficulty being awakened. °· Minimal urine production. °· No tears. °DIAGNOSIS  °Your health care provider will diagnose dehydration based on your symptoms and your exam. Blood and urine tests will help confirm the diagnosis. The diagnostic evaluation should also identify the cause of dehydration. °TREATMENT  °Treatment of mild or moderate dehydration can often be done at home by increasing the amount of fluids that you drink. It is best to drink small amounts of fluid more often. Drinking too much at one time can make vomiting worse. Severe dehydration needs to be treated at the  hospital. You may be given IV fluids that contain water and electrolytes. °HOME CARE INSTRUCTIONS  °· Ask your health care provider about specific rehydration instructions. °· Drink enough fluids to keep your urine clear or pale yellow. °· Drink small amounts frequently if you have nausea and vomiting. °· Eat as you normally do. °· Avoid: °· Foods or drinks high in sugar. °· Carbonated drinks. °· Juice. °· Extremely hot or cold fluids. °· Drinks with caffeine. °· Fatty, greasy foods. °· Alcohol. °· Tobacco. °· Overeating. °· Gelatin desserts. °· Wash your hands well to avoid spreading bacteria and viruses. °· Only take over-the-counter or prescription medicines for pain, discomfort, or fever as directed by your health care provider. °· Ask your health care provider if you should continue all prescribed and over-the-counter medicines. °· Keep all follow-up appointments with your health care provider. °SEEK MEDICAL CARE IF: °· You have abdominal pain, and it increases or stays in one area (localizes). °· You have a rash, stiff neck, or severe headache. °· You are irritable, sleepy, or difficult to awaken. °· You are weak, dizzy, or extremely thirsty. °SEEK IMMEDIATE MEDICAL CARE IF:  °· You are unable to keep fluids down, or you get worse despite treatment. °· You have frequent episodes of vomiting or diarrhea. °· You have blood or green matter (bile) in your vomit. °· You have blood in your stool, or your stool looks black and tarry. °· You have not urinated in 6 8 hours, or you have only urinated a small amount of very dark urine. °· You have a   fever. °· You faint. °MAKE SURE YOU:  °· Understand these instructions. °· Will watch your condition. °· Will get help right away if you are not doing well or get worse. °Document Released: 08/20/2003 Document Revised: 03/20/2013 Document Reviewed: 02/04/2013 °Exit[employer]® Patient Information ©2014 [employer]. ° °

## 2013-07-28 NOTE — ED Notes (Signed)
Dr. Ghim at bedside. 

## 2013-07-28 NOTE — ED Notes (Signed)
Pt drinking on water without problem at this time. sts feels much better.

## 2013-07-28 NOTE — ED Notes (Signed)
Informed pt that we need urine sample. Pt sts unable to get sample now and will let us know when she can.  She is refusing I/O cath

## 2013-07-28 NOTE — ED Notes (Signed)
Pt c/o N/V/D onset Thursday. Pt denies abdominal pain. Pt has tried phenergan by mouth but unable to keep it down. Pt attempted to call PMD for suppository phenergan but never got a response.

## 2013-07-29 LAB — URINE CULTURE: Colony Count: 70000

## 2013-08-19 ENCOUNTER — Telehealth: Payer: Self-pay | Admitting: Cardiovascular Disease

## 2013-08-19 NOTE — Telephone Encounter (Signed)
Returned call and pt verified x 2.  Pt stated she has tried taking Toprol.  Stated it makes her feel bad.  C/o shaking bad and feeling weak.  Stated she didn't take it yesterday or today and felt fine.  Pt does not check BP at home.  Last BP check was at GYN office and it was "okay."  Last recorded BP in Epic was at ER visit on 2.15.15 and pt stated she had not been taking meds b/c of N/V.  Pt informed Dr. Gwenlyn Found will be notified for further instructions and she will be contacted once a response is given.  Pt verbalized understanding and agreed w/ plan.  Message forwarded to Curt Bears, RN to discuss w/ Dr. Gwenlyn Found.

## 2013-08-19 NOTE — Telephone Encounter (Signed)
Please call-can not keep taking Toprol.

## 2013-08-20 MED ORDER — ATENOLOL 25 MG PO TABS
25.0000 mg | ORAL_TABLET | Freq: Every day | ORAL | Status: DC
Start: 2013-08-20 — End: 2013-09-03

## 2013-08-20 NOTE — Telephone Encounter (Signed)
Returned call and pt verified.  Pt reminded this RN spoke w/ her yesterday and informed her Dr. Gwenlyn Found will be notified and she will be contacted once a response given.  Informed no response at this time and assured pt she will be notified once one is given.  Pt verbalized understanding and agreed w/ plan.  Message has been sent to Curt Bears, RN to discuss w/ Dr. Gwenlyn Found.

## 2013-08-20 NOTE — Telephone Encounter (Signed)
Pt called again,says she is still waiting to find out her medicine.

## 2013-08-20 NOTE — Telephone Encounter (Signed)
Patient notified of results RX sent for atenolol

## 2013-08-20 NOTE — Telephone Encounter (Signed)
Change the Toprol to atenolol 25 mg

## 2013-08-20 NOTE — Telephone Encounter (Signed)
Please advise 

## 2013-08-23 ENCOUNTER — Telehealth: Payer: Self-pay | Admitting: Cardiovascular Disease

## 2013-08-23 DIAGNOSIS — N812 Incomplete uterovaginal prolapse: Secondary | ICD-10-CM | POA: Diagnosis not present

## 2013-08-23 NOTE — Telephone Encounter (Signed)
She went to her GYN and her blood pressure was up,it was 158/58. Please call her.

## 2013-08-23 NOTE — Telephone Encounter (Signed)
Returned call and pt verified x 2.  Pt stated she was changed from Toprol to atenolol.  Stated she hasn't been feeling good today.  Stated she went to her GYN today and BP was 158/58.  Wanted to make sure it wasn't too high since it's almost the weekend.  Pt informed BP is elevated, but not critical.  Pt denied CP, SOB, HA or vision changes.  Stated she just feel "shaky."  Pt informed she may need to try it for at least a week to build up therapeutically in her body before any changes are made, but Dr. Gwenlyn Found will be notified for further instructions.  Pt verbalized understanding and agreed w/ plan.  Message forwarded to Curt Bears, RN to discuss w/ Dr. Gwenlyn Found.

## 2013-08-26 NOTE — Telephone Encounter (Signed)
I advised patient to check BP while at the [facility] and report back to [place]. Her blood sugar has also been elevated.  I advised her to watch what she eats and contact her primary MD.

## 2013-08-26 NOTE — Telephone Encounter (Signed)
I called patient and she is feeling better today.  She has not taken her blood pressure over the weekend.  She does not have a cuff at home.

## 2013-09-02 ENCOUNTER — Telehealth: Payer: Self-pay | Admitting: Cardiovascular Disease

## 2013-09-02 NOTE — Telephone Encounter (Signed)
Pt said she was on Atenolol,she stopped taking it.It was making her feel bad,she went back to Metoprolol. [facility] [address]

## 2013-09-02 NOTE — Telephone Encounter (Signed)
Returned call and pt verified x 2.  Pt informed message received and Dr. Gwenlyn Found will be notified, but is not in the office until tomorrow.  Pt informed script cannot be sent until approved by MD.  Pt verbalized understanding and agreed w/ plan.  Pt stated her BP has been better since she changed and she feels better.  Stated BP has been 133/"sixty-something."  Pt stated she has enough med for another month b/c she had just refilled it before it was switched to atenolol.  Stated she wants to stay on metoprolol unless he (Dr. Gwenlyn Found) has something else he wants to put her on.  Message forwarded to Curt Bears, RN to discuss w/ Dr. Gwenlyn Found.

## 2013-09-03 MED ORDER — METOPROLOL SUCCINATE ER 50 MG PO TB24: 50.0000 mg | ORAL_TABLET | Freq: Every day | ORAL | Status: DC

## 2013-09-03 NOTE — Telephone Encounter (Signed)
Reviewed information with Dr Gwenlyn Found at the end of clinic.  Okay to switch back to Metoprolol per Dr Gwenlyn Found.

## 2013-09-04 NOTE — Telephone Encounter (Signed)
Script sent to pharmacy by Curt Bears, RN.

## 2013-09-04 NOTE — Telephone Encounter (Signed)
Returned call.  Left message on pt-identified voicemail that med change approved and script sent.  Also to call back before 4pm if questions.

## 2013-09-06 DIAGNOSIS — E785 Hyperlipidemia, unspecified: Secondary | ICD-10-CM | POA: Diagnosis not present

## 2013-09-06 DIAGNOSIS — Z79899 Other long term (current) drug therapy: Secondary | ICD-10-CM | POA: Diagnosis not present

## 2013-09-06 LAB — HEPATIC FUNCTION PANEL
ALT: 20 U/L (ref 0–35)
AST: 16 U/L (ref 0–37)
Albumin: 4.4 g/dL (ref 3.5–5.2)
Alkaline Phosphatase: 73 U/L (ref 39–117)
Bilirubin, Direct: 0.1 mg/dL (ref 0.0–0.3)
Indirect Bilirubin: 0.4 mg/dL (ref 0.2–1.2)
Total Bilirubin: 0.5 mg/dL (ref 0.2–1.2)
Total Protein: 6.4 g/dL (ref 6.0–8.3)

## 2013-09-06 LAB — LIPID PANEL
Cholesterol: 161 mg/dL (ref 0–200)
HDL: 42 mg/dL (ref >39)
LDL Cholesterol: 69 mg/dL (ref 0–99)
Total CHOL/HDL Ratio: 3.8 Ratio
Triglycerides: 251 mg/dL — ABNORMAL HIGH (ref <150)
VLDL: 50 mg/dL — ABNORMAL HIGH (ref 0–40)

## 2013-09-13 ENCOUNTER — Telehealth: Payer: Self-pay | Admitting: Cardiovascular Disease

## 2013-09-13 NOTE — Telephone Encounter (Signed)
Calling about results from lab work done last Friday 09/06/13.Marland KitchenPlease Call   Thanks

## 2013-09-13 NOTE — Telephone Encounter (Signed)
Returned call and spoke w/ pt.  Informed results have not been reviewed by MD/PA and nurse will call, mail letter or release in [place] after they are reviewed.  Pt verbalized understanding and agreed w/ plan.

## 2013-09-20 ENCOUNTER — Telehealth: Payer: Self-pay | Admitting: Cardiovascular Disease

## 2013-09-20 NOTE — Telephone Encounter (Signed)
Returned call.  Left message that results not reviewed by MD and will be contacted once they have been.  Also to call back before 4pm if questions.

## 2013-09-20 NOTE — Telephone Encounter (Signed)
Is calling about her lab results .[name] Wright Please call

## 2013-10-02 DIAGNOSIS — I10 Essential (primary) hypertension: Secondary | ICD-10-CM | POA: Diagnosis not present

## 2013-10-02 DIAGNOSIS — I2510 Atherosclerotic heart disease of native coronary artery without angina pectoris: Secondary | ICD-10-CM | POA: Diagnosis not present

## 2013-10-02 DIAGNOSIS — IMO0002 Reserved for concepts with insufficient information to code with codable children: Secondary | ICD-10-CM | POA: Diagnosis not present

## 2013-10-02 DIAGNOSIS — Z79899 Other long term (current) drug therapy: Secondary | ICD-10-CM | POA: Diagnosis not present

## 2013-10-02 DIAGNOSIS — E7489 Other specified disorders of carbohydrate metabolism: Secondary | ICD-10-CM | POA: Diagnosis not present

## 2013-10-11 ENCOUNTER — Telehealth: Payer: Self-pay | Admitting: Cardiovascular Disease

## 2013-10-11 DIAGNOSIS — Z79899 Other long term (current) drug therapy: Secondary | ICD-10-CM

## 2013-10-11 DIAGNOSIS — N812 Incomplete uterovaginal prolapse: Secondary | ICD-10-CM | POA: Diagnosis not present

## 2013-10-11 DIAGNOSIS — E785 Hyperlipidemia, unspecified: Secondary | ICD-10-CM

## 2013-10-11 NOTE — Telephone Encounter (Signed)
Pt called and said she still have not gotten her lab results drom around April 23.Please call,very concerned.

## 2013-10-11 NOTE — Telephone Encounter (Signed)
Spoke to patient. Lipid, Hepatic result given.Informed patient that Dr Gwenlyn Found has not reviewed ,awaiting for any changes ,willcall back with the changes is needed .  Verbalized understanding  Appointment made 11/29/13 at 1:30 pm with extender.

## 2013-10-11 NOTE — Telephone Encounter (Signed)
Results not reviewed by MD from 3.27.15.  Message forwarded to Curt Bears, RN to discuss w/ Dr. Gwenlyn Found and contact pt.

## 2013-10-16 NOTE — Telephone Encounter (Signed)
I left message for patient that I will mail repeat lab slip and lab results to patient. She can also call back for verbal results.

## 2013-10-16 NOTE — Telephone Encounter (Signed)
Her fasting lipid profile and liver function tests were acceptable except for elevated triglycerides. Recheck fasting still abnormal will entertain preoperative fenofibrate

## 2013-10-16 NOTE — Addendum Note (Signed)
Addended byChauncy Lean. on: 10/16/2013 01:32 PM   Modules accepted: Orders

## 2013-11-13 ENCOUNTER — Other Ambulatory Visit: Payer: Self-pay

## 2013-11-13 MED ORDER — NITROGLYCERIN 0.4 MG SL SUBL: 0.4000 mg | SUBLINGUAL_TABLET | SUBLINGUAL | Status: DC | PRN

## 2013-11-13 NOTE — Telephone Encounter (Signed)
Rx was sent to pharmacy electronically. 

## 2013-11-14 DIAGNOSIS — Z6825 Body mass index (BMI) 25.0-25.9, adult: Secondary | ICD-10-CM | POA: Diagnosis not present

## 2013-11-14 DIAGNOSIS — J301 Allergic rhinitis due to pollen: Secondary | ICD-10-CM | POA: Diagnosis not present

## 2013-11-14 DIAGNOSIS — J984 Other disorders of lung: Secondary | ICD-10-CM | POA: Diagnosis not present

## 2013-11-15 ENCOUNTER — Other Ambulatory Visit: Payer: Self-pay | Admitting: *Deleted

## 2013-11-15 MED ORDER — NITROGLYCERIN 0.4 MG SL SUBL: 0.4000 mg | SUBLINGUAL_TABLET | SUBLINGUAL | Status: DC | PRN

## 2013-11-25 DIAGNOSIS — N812 Incomplete uterovaginal prolapse: Secondary | ICD-10-CM | POA: Diagnosis not present

## 2013-11-29 ENCOUNTER — Ambulatory Visit (INDEPENDENT_AMBULATORY_CARE_PROVIDER_SITE_OTHER): Payer: Medicare Other | Admitting: Cardiology

## 2013-11-29 ENCOUNTER — Encounter: Payer: Self-pay | Admitting: Cardiology

## 2013-11-29 VITALS — BP 140/70 | HR 71 | Ht 62.0 in | Wt 138.0 lb

## 2013-11-29 DIAGNOSIS — I10 Essential (primary) hypertension: Secondary | ICD-10-CM

## 2013-11-29 DIAGNOSIS — I2510 Atherosclerotic heart disease of native coronary artery without angina pectoris: Secondary | ICD-10-CM | POA: Diagnosis not present

## 2013-11-29 DIAGNOSIS — I2583 Coronary atherosclerosis due to lipid rich plaque: Secondary | ICD-10-CM | POA: Diagnosis not present

## 2013-11-29 DIAGNOSIS — E119 Type 2 diabetes mellitus without complications: Secondary | ICD-10-CM | POA: Diagnosis not present

## 2013-11-29 DIAGNOSIS — E785 Hyperlipidemia, unspecified: Secondary | ICD-10-CM

## 2013-11-29 NOTE — Assessment & Plan Note (Signed)
Low risk Myoview 2011, no angina

## 2013-11-29 NOTE — Assessment & Plan Note (Signed)
Treated by diet

## 2013-11-29 NOTE — Assessment & Plan Note (Signed)
LDL 69 in March 2015, Nl LFTs

## 2013-11-29 NOTE — Patient Instructions (Signed)
1.Your physician recommends that you schedule a follow-up appointment in:  6 months with Dr. Gwenlyn Found  2. Continue your same medications

## 2013-11-29 NOTE — Progress Notes (Signed)
11/29/2013 [name] Wright   [birth year]  [id number]  Primary Physicia [name], MD Primary Cardiologist: Dr [name] Wright  HPI:  The patient is a 73 year old female from [place].. She has a history of CAD status post RCA stenting with a Cypher drug-eluting stent by Dr. Charolette Forward in May of 2006. She did have residual disease in her LAD and circumflex coronary arteries. Her last Myoview, performed September 30, 2009, was nonischemic. She denies chest pain or shortness of breath. Her other problems include hypertension, hyperlipidemia, on Pravachol. She has had problems with other statins but her recent Lipid profile in March 2015 showed an LDL of 69. She has venous varicosities and mild LE edema.     Current Outpatient Prescriptions  Medication Sig Dispense Refill  . amLODipine (NORVASC) 2.5 MG tablet Take 2.5 mg by mouth daily.      . clopidogrel (PLAVIX) 75 MG tablet Take 75 mg by mouth daily with breakfast.      . Coenzyme Q10 50 MG CAPS Take by mouth.      . famotidine (PEPCID) 20 MG tablet Take 1 tablet (20 mg total) by mouth 2 (two) times daily.  20 tablet  0  . IRON PO Take 325 mg by mouth 2 (two) times daily.       [name] loratadine (CLARITIN) 10 MG tablet Take 10 mg by mouth as needed for allergies.      . metoprolol succinate (TOPROL-XL) 50 MG 24 hr tablet Take 1 tablet (50 mg total) by mouth daily. Take with or immediately following a meal.  30 tablet  11  . nitroGLYCERIN (NITROSTAT) 0.4 MG SL tablet Place 1 tablet (0.4 mg total) under the tongue every 5 (five) minutes as needed for chest pain.  25 tablet  3  . Omega-3 Fatty Acids (FISH OIL PO) Take 1,200 mg by mouth daily.       . ondansetron (ZOFRAN-ODT) 8 MG disintegrating tablet Take 1 tablet (8 mg total) by mouth every 12 (twelve) hours as needed for nausea.  20 tablet  0  . pantoprazole (PROTONIX) 40 MG tablet Take 40 mg by mouth daily.      . pravastatin (PRAVACHOL) 20 MG tablet Take 20 mg by mouth every evening.       No  current facility-administered medications for this visit.    Allergies  Allergen Reactions  . Altace [Ramipril] Other (See Comments)    Tremors and increased tinnitus  . Avelox [Moxifloxacin Hcl In Nacl] Nausea Only and Other (See Comments)    Throat and tongue swelling  . Hctz [Hydrochlorothiazide] Other (See Comments)    Palpitations and tremors  . Lisinopril Other (See Comments)    Headaches  . Losartan Other (See Comments)    Palpitations and tremors  . Statins Other (See Comments)    Increased blood pressure  . Sulfa Antibiotics Rash    History   Social History  . Marital Status: Divorced    Spouse Name: N/A    Number of Children: N/A  . Years of Education: N/A   Occupational History  . Not on file.   Social History Main Topics  . Smoking status: Never Smoker   . Smokeless tobacco: Not on file  . Alcohol Use: No  . Drug Use: Not on file  . Sexual Activity: Not on file   Other Topics Concern  . Not on file   Social History Narrative  . No narrative on file     Review of Systems:  General: negative for chills, fever, night sweats or weight changes. Recent URI- on her second round of antibiotics Cardiovascular: negative for chest pain, dyspnea on exertion, edema, orthopnea, palpitations, paroxysmal nocturnal dyspnea or shortness of breath Dermatological: negative for rash Respiratory: negative for cough or wheezing Urologic: negative for hematuria Abdominal: negative for nausea, vomiting, diarrhea, bright red blood per rectum, melena, or hematemesis Neurologic: negative for visual changes, syncope, or dizziness All other systems reviewed and are otherwise negative except as noted above.    Blood pressure 140/70, pulse 71, height 5\' 2"  (1.575 m), weight 138 lb (62.596 kg).  General appearance: alert, cooperative and no distress Lungs: clear to auscultation bilaterally Heart: regular rate and rhythm Extremities: trace edema  EKG NSR inferior  Qs  ASSESSMENT AND PLAN:   CAD- RCA DES 2006 Low risk Myoview 2011, no angina  Diabetes mellitus without complication Treated by diet  Hyperlipidemia LDL 69 in March 2015, Nl LFTs  Hypertension Controlled   PLAN  Same Rx, f/u Dr [name] Wright 6 months.  Wright,[name] KPA-C 11/29/2013 1:57 PM

## 2013-11-29 NOTE — Assessment & Plan Note (Signed)
Controlled.  

## 2014-01-07 DIAGNOSIS — N812 Incomplete uterovaginal prolapse: Secondary | ICD-10-CM | POA: Diagnosis not present

## 2014-01-14 DIAGNOSIS — Z79899 Other long term (current) drug therapy: Secondary | ICD-10-CM | POA: Diagnosis not present

## 2014-01-14 DIAGNOSIS — E785 Hyperlipidemia, unspecified: Secondary | ICD-10-CM | POA: Diagnosis not present

## 2014-01-14 LAB — LIPID PANEL
Cholesterol: 186 mg/dL (ref 0–200)
HDL: 48 mg/dL (ref >39)
LDL Cholesterol: 86 mg/dL (ref 0–99)
Total CHOL/HDL Ratio: 3.9 Ratio
Triglycerides: 258 mg/dL — ABNORMAL HIGH (ref <150)
VLDL: 52 mg/dL — ABNORMAL HIGH (ref 0–40)

## 2014-01-14 LAB — HEPATIC FUNCTION PANEL
ALBUMIN: 4.3 g/dL (ref 3.5–5.2)
ALK PHOS: 79 U/L (ref 39–117)
ALT: 19 U/L (ref 0–35)
AST: 17 U/L (ref 0–37)
Bilirubin, Direct: 0.1 mg/dL (ref 0.0–0.3)
Indirect Bilirubin: 0.5 mg/dL (ref 0.2–1.2)
TOTAL PROTEIN: 6.7 g/dL (ref 6.0–8.3)
Total Bilirubin: 0.6 mg/dL (ref 0.2–1.2)

## 2014-01-19 ENCOUNTER — Other Ambulatory Visit: Payer: Self-pay | Admitting: Cardiovascular Disease

## 2014-01-20 NOTE — Telephone Encounter (Signed)
Rx refill sent to patient pharmacy   

## 2014-01-21 ENCOUNTER — Telehealth: Payer: Self-pay | Admitting: *Deleted

## 2014-01-21 DIAGNOSIS — Z6826 Body mass index (BMI) 26.0-26.9, adult: Secondary | ICD-10-CM | POA: Diagnosis not present

## 2014-01-21 DIAGNOSIS — E119 Type 2 diabetes mellitus without complications: Secondary | ICD-10-CM | POA: Diagnosis not present

## 2014-01-21 DIAGNOSIS — Z79899 Other long term (current) drug therapy: Secondary | ICD-10-CM | POA: Diagnosis not present

## 2014-01-21 DIAGNOSIS — I2510 Atherosclerotic heart disease of native coronary artery without angina pectoris: Secondary | ICD-10-CM | POA: Diagnosis not present

## 2014-01-21 DIAGNOSIS — I10 Essential (primary) hypertension: Secondary | ICD-10-CM | POA: Diagnosis not present

## 2014-01-21 NOTE — Telephone Encounter (Signed)
Message copied by Chauncy Lean on Tue Jan 21, 2014  1:58 PM ------      Message from: Lorretta Harp      Created: Sun Jan 19, 2014  4:50 PM       FLP slightly worse then 4 months ago with continued increased Trig. ? Diet ------

## 2014-01-21 NOTE — Telephone Encounter (Signed)
Spoke with patient about her lab results.

## 2014-02-21 DIAGNOSIS — D252 Subserosal leiomyoma of uterus: Secondary | ICD-10-CM | POA: Diagnosis not present

## 2014-04-09 ENCOUNTER — Telehealth: Payer: Self-pay | Admitting: Cardiovascular Disease

## 2014-04-09 NOTE — Telephone Encounter (Signed)
Please call,question about her Pravastatin.Please call before 12 or after 3.

## 2014-04-09 NOTE — Telephone Encounter (Signed)
Patient called. She states she notice she developing muscles soreness and pain  ,mainly in her arms, She states she stopped taking the pravastatin - feels a little better , she also states her blood sugar has been a lower. She states that she had a problem with Lipitor Patient was concerned, since she get the medication for free- if she [facility] RN informed medication from mail orders are FDA approved RN informed patient to statin holiday- hold pravastatin for 2 weeks to see if discomfort resolves. Call back to give office a report

## 2014-04-14 DIAGNOSIS — N814 Uterovaginal prolapse, unspecified: Secondary | ICD-10-CM | POA: Diagnosis not present

## 2014-04-14 DIAGNOSIS — Z1231 Encounter for screening mammogram for malignant neoplasm of breast: Secondary | ICD-10-CM | POA: Diagnosis not present

## 2014-04-14 DIAGNOSIS — N939 Abnormal uterine and vaginal bleeding, unspecified: Secondary | ICD-10-CM | POA: Diagnosis not present

## 2014-05-13 DIAGNOSIS — H5203 Hypermetropia, bilateral: Secondary | ICD-10-CM | POA: Diagnosis not present

## 2014-05-13 DIAGNOSIS — H25812 Combined forms of age-related cataract, left eye: Secondary | ICD-10-CM | POA: Diagnosis not present

## 2014-05-13 DIAGNOSIS — H52223 Regular astigmatism, bilateral: Secondary | ICD-10-CM | POA: Diagnosis not present

## 2014-05-13 DIAGNOSIS — H524 Presbyopia: Secondary | ICD-10-CM | POA: Diagnosis not present

## 2014-05-22 DIAGNOSIS — N950 Postmenopausal bleeding: Secondary | ICD-10-CM | POA: Diagnosis not present

## 2014-05-26 ENCOUNTER — Other Ambulatory Visit: Payer: Self-pay | Admitting: Cardiovascular Disease

## 2014-05-26 NOTE — Telephone Encounter (Signed)
Rx has been sent to the pharmacy electronically. ° °

## 2014-05-27 ENCOUNTER — Encounter: Payer: Self-pay | Admitting: Cardiovascular Disease

## 2014-05-27 ENCOUNTER — Ambulatory Visit (INDEPENDENT_AMBULATORY_CARE_PROVIDER_SITE_OTHER): Payer: Medicare Other | Admitting: Cardiovascular Disease

## 2014-05-27 VITALS — BP 142/80 | HR 69 | Ht 62.0 in | Wt 140.0 lb

## 2014-05-27 DIAGNOSIS — I10 Essential (primary) hypertension: Secondary | ICD-10-CM

## 2014-05-27 DIAGNOSIS — I2510 Atherosclerotic heart disease of native coronary artery without angina pectoris: Secondary | ICD-10-CM

## 2014-05-27 DIAGNOSIS — E785 Hyperlipidemia, unspecified: Secondary | ICD-10-CM | POA: Diagnosis not present

## 2014-05-27 DIAGNOSIS — I2583 Coronary atherosclerosis due to lipid rich plaque: Secondary | ICD-10-CM

## 2014-05-27 NOTE — Assessment & Plan Note (Signed)
History of CAD status post RCA stenting using a Cypher drug-eluting stent by Dr. Charolette Forward  May 2006. She did have residual disease in her LAD and circumflex complex coronary arteries. Her last Myoview performed/20/11 was nonischemic. She denies chest pain or shortness of breath

## 2014-05-27 NOTE — Patient Instructions (Signed)
Your physician wants you to follow-up in: ONE YEAR with Dr. Berry. You will receive a reminder letter in the mail two months in advance. If you don't receive a letter, please call our office to schedule the follow-up appointment.  

## 2014-05-27 NOTE — Assessment & Plan Note (Signed)
History of hypertension with blood pressure measured today 122/80. She is on metoprolol and amlodipine. Continue current meds at current dosing

## 2014-05-27 NOTE — Assessment & Plan Note (Signed)
History of hyperlipidemia on pravastatin 20 mg a day. Her most recent lipid profile performed 8//15 revealed a total cholesterol of 186, LDL of 86 and HDL of 48. Continue current meds at current dosing

## 2014-05-27 NOTE — Progress Notes (Signed)
05/27/2014 [name] Wright   1940/11/17  [id number]  Primary Physician [name] Wright., MD Primary Cardiologist: [name] Harp MD [name] Wright   HPI:  The patient is a 73 year old thin and frail-appearing divorced Caucasian female, mother of 70, grandmother of 5 grandchildren, who I last saw in the office 12 months ago. She has a history of CAD status post RCA stenting with a Cypher drug-eluting stent by [name] Wright in May of 2006. She did have residual disease in her LAD and circumflex coronary arteries. Her last Myoview, performed September 30, 2009, was non Sischemic. She denies chest pain or shortness of breath. Her other problems include hypertension, hyperlipidemia, on Pravachol. Her most recent lab work performed 01/14/14 revealed a total cholesterol 186, LDL of 86 and HDL of 48. She saw [name] Wright back 11/29/13. She's done well since that time and is asymptomatic.   Current Outpatient Prescriptions  Medication Sig Dispense Refill  . amLODipine (NORVASC) 2.5 MG tablet Take 2.5 mg by mouth daily.    . clopidogrel (PLAVIX) 75 MG tablet TAKE 1 TABLET BY MOUTH EVERYDAY 90 tablet 1  . Coenzyme Q10 50 MG CAPS Take by mouth.    . famotidine (PEPCID) 20 MG tablet Take 1 tablet (20 mg total) by mouth 2 (two) times daily. 20 tablet 0  . IRON PO Take 325 mg by mouth 2 (two) times daily.     [name] loratadine (CLARITIN) 10 MG tablet Take 10 mg by mouth as needed for allergies.    . metoprolol succinate (TOPROL-XL) 50 MG 24 hr tablet Take 1 tablet (50 mg total) by mouth daily. Take with or immediately following a meal. 30 tablet 11  . nitroGLYCERIN (NITROSTAT) 0.4 MG SL tablet Place 1 tablet (0.4 mg total) under the tongue every 5 (five) minutes as needed for chest pain. 25 tablet 3  . Omega-3 Fatty Acids (FISH OIL PO) Take 1,200 mg by mouth daily.     . ondansetron (ZOFRAN-ODT) 8 MG disintegrating tablet Take 1 tablet (8 mg total) by mouth every 12 (twelve) hours as needed for  nausea. 20 tablet 0  . pantoprazole (PROTONIX) 40 MG tablet Take 40 mg by mouth daily.    . pantoprazole (PROTONIX) 40 MG tablet TAKE 1 TABLET BY MOUTH EVERY DAY 90 tablet 1  . pravastatin (PRAVACHOL) 20 MG tablet Take 20 mg by mouth every evening.     No current facility-administered medications for this visit.    Allergies  Allergen Reactions  . Altace [Ramipril] Other (See Comments)    Tremors and increased tinnitus  . Avelox [Moxifloxacin Hcl In Nacl] Nausea Only and Other (See Comments)    Throat and tongue swelling  . Hctz [Hydrochlorothiazide] Other (See Comments)    Palpitations and tremors  . Lisinopril Other (See Comments)    Headaches  . Losartan Other (See Comments)    Palpitations and tremors  . Statins Other (See Comments)    Increased blood pressure  . Sulfa Antibiotics Rash    History   Social History  . Marital Status: Divorced    Spouse Name: N/A    Number of Children: N/A  . Years of Education: N/A   Occupational History  . Not on file.   Social History Main Topics  . Smoking status: Never Smoker   . Smokeless tobacco: Not on file  . Alcohol Use: No  . Drug Use: Not on file  . Sexual Activity: Not on file   Other Topics Concern  .  Not on file   Social History Narrative     Review of Systems: General: negative for chills, fever, night sweats or weight changes.  Cardiovascular: negative for chest pain, dyspnea on exertion, edema, orthopnea, palpitations, paroxysmal nocturnal dyspnea or shortness of breath Dermatological: negative for rash Respiratory: negative for cough or wheezing Urologic: negative for hematuria Abdominal: negative for nausea, vomiting, diarrhea, bright red blood per rectum, melena, or hematemesis Neurologic: negative for visual changes, syncope, or dizziness All other systems reviewed and are otherwise negative except as noted above.    Blood pressure 142/80, pulse 69, height 5\' 2"  (1.575 m), weight 140 lb (63.504 kg).    General appearance: alert and no distress Neck: no adenopathy, no carotid bruit, no JVD, supple, symmetrical, trachea midline and thyroid not enlarged, symmetric, no tenderness/mass/nodules Lungs: clear to auscultation bilaterally Heart: regular rate and rhythm, S1, S2 normal, no murmur, click, rub or gallop Extremities: extremities normal, atraumatic, no cyanosis or edema  EKG normal sinus rhythm at 69 without ST or T-wave changes. There were small Q waves in leads II, III, and F as well as the lateral precordial leads. I personally reviewed this EKG  ASSESSMENT AND PLAN:   Hypertension History of hypertension with blood pressure measured today 122/80. She is on metoprolol and amlodipine. Continue current meds at current dosing  Hyperlipidemia History of hyperlipidemia on pravastatin 20 mg a day. Her most recent lipid profile performed 8//15 revealed a total cholesterol of 186, LDL of 86 and HDL of 48. Continue current meds at current dosing  CAD- RCA DES 2006 History of CAD status post RCA stenting using a Cypher drug-eluting stent by [name] Wright  May 2006. She did have residual disease in her LAD and circumflex complex coronary arteries. Her last Myoview performed/20/11 was nonischemic. She denies chest pain or shortness of breath      [name] Harp MD [facility], [facility] 05/27/2014 2:18 PM

## 2014-06-02 DIAGNOSIS — W5501XA Bitten by cat, initial encounter: Secondary | ICD-10-CM | POA: Diagnosis not present

## 2014-06-02 DIAGNOSIS — M79601 Pain in right arm: Secondary | ICD-10-CM | POA: Diagnosis not present

## 2014-06-17 DIAGNOSIS — N814 Uterovaginal prolapse, unspecified: Secondary | ICD-10-CM | POA: Insufficient documentation

## 2014-06-17 DIAGNOSIS — E2839 Other primary ovarian failure: Secondary | ICD-10-CM | POA: Diagnosis not present

## 2014-06-17 DIAGNOSIS — E782 Mixed hyperlipidemia: Secondary | ICD-10-CM | POA: Diagnosis not present

## 2014-06-17 DIAGNOSIS — I2510 Atherosclerotic heart disease of native coronary artery without angina pectoris: Secondary | ICD-10-CM | POA: Insufficient documentation

## 2014-06-17 DIAGNOSIS — E1169 Type 2 diabetes mellitus with other specified complication: Secondary | ICD-10-CM | POA: Diagnosis not present

## 2014-06-17 DIAGNOSIS — E119 Type 2 diabetes mellitus without complications: Secondary | ICD-10-CM | POA: Diagnosis not present

## 2014-06-17 DIAGNOSIS — I10 Essential (primary) hypertension: Secondary | ICD-10-CM | POA: Insufficient documentation

## 2014-06-17 DIAGNOSIS — Z4689 Encounter for fitting and adjustment of other specified devices: Secondary | ICD-10-CM | POA: Insufficient documentation

## 2014-06-19 DIAGNOSIS — E119 Type 2 diabetes mellitus without complications: Secondary | ICD-10-CM | POA: Diagnosis not present

## 2014-06-19 DIAGNOSIS — M8580 Other specified disorders of bone density and structure, unspecified site: Secondary | ICD-10-CM | POA: Diagnosis not present

## 2014-07-08 DIAGNOSIS — N814 Uterovaginal prolapse, unspecified: Secondary | ICD-10-CM | POA: Diagnosis not present

## 2014-07-22 ENCOUNTER — Other Ambulatory Visit: Payer: Self-pay | Admitting: Cardiovascular Disease

## 2014-07-22 MED ORDER — AMLODIPINE BESYLATE 2.5 MG PO TABS: 2.5000 mg | ORAL_TABLET | Freq: Every day | ORAL | Status: DC

## 2014-07-22 NOTE — Telephone Encounter (Signed)
Pt need a new prescription for her Amlodipine #90 and refills.Please send this to [facility].

## 2014-07-22 NOTE — Telephone Encounter (Signed)
Rx(s) sent to pharmacy electronically.  

## 2014-07-24 ENCOUNTER — Other Ambulatory Visit: Payer: Self-pay | Admitting: Cardiovascular Disease

## 2014-07-24 NOTE — Telephone Encounter (Signed)
Pt said she was told to call and let you know [facility] would be sending a prescription for Pravastatin.

## 2014-07-24 NOTE — Telephone Encounter (Signed)
Rx has been sent to the pharmacy electronically. ° °

## 2014-08-05 DIAGNOSIS — N814 Uterovaginal prolapse, unspecified: Secondary | ICD-10-CM | POA: Diagnosis not present

## 2014-09-02 DIAGNOSIS — D485 Neoplasm of uncertain behavior of skin: Secondary | ICD-10-CM | POA: Diagnosis not present

## 2014-09-02 DIAGNOSIS — C4491 Basal cell carcinoma of skin, unspecified: Secondary | ICD-10-CM

## 2014-09-02 DIAGNOSIS — L570 Actinic keratosis: Secondary | ICD-10-CM | POA: Diagnosis not present

## 2014-09-02 DIAGNOSIS — C44310 Basal cell carcinoma of skin of unspecified parts of face: Secondary | ICD-10-CM | POA: Diagnosis not present

## 2014-09-02 DIAGNOSIS — L089 Local infection of the skin and subcutaneous tissue, unspecified: Secondary | ICD-10-CM | POA: Diagnosis not present

## 2014-09-02 DIAGNOSIS — C44319 Basal cell carcinoma of skin of other parts of face: Secondary | ICD-10-CM | POA: Diagnosis not present

## 2014-09-02 HISTORY — DX: Basal cell carcinoma of skin, unspecified: C44.91

## 2014-09-03 DIAGNOSIS — N814 Uterovaginal prolapse, unspecified: Secondary | ICD-10-CM | POA: Diagnosis not present

## 2014-09-16 DIAGNOSIS — I2510 Atherosclerotic heart disease of native coronary artery without angina pectoris: Secondary | ICD-10-CM | POA: Diagnosis not present

## 2014-09-16 DIAGNOSIS — E782 Mixed hyperlipidemia: Secondary | ICD-10-CM | POA: Diagnosis not present

## 2014-09-16 DIAGNOSIS — E1169 Type 2 diabetes mellitus with other specified complication: Secondary | ICD-10-CM | POA: Diagnosis not present

## 2014-09-16 DIAGNOSIS — E119 Type 2 diabetes mellitus without complications: Secondary | ICD-10-CM | POA: Diagnosis not present

## 2014-09-16 DIAGNOSIS — M8580 Other specified disorders of bone density and structure, unspecified site: Secondary | ICD-10-CM | POA: Diagnosis not present

## 2014-09-26 DIAGNOSIS — N814 Uterovaginal prolapse, unspecified: Secondary | ICD-10-CM | POA: Diagnosis not present

## 2014-10-01 ENCOUNTER — Other Ambulatory Visit: Payer: Self-pay | Admitting: Cardiovascular Disease

## 2014-10-21 DIAGNOSIS — N814 Uterovaginal prolapse, unspecified: Secondary | ICD-10-CM | POA: Diagnosis not present

## 2014-10-30 DIAGNOSIS — L089 Local infection of the skin and subcutaneous tissue, unspecified: Secondary | ICD-10-CM | POA: Diagnosis not present

## 2014-10-30 DIAGNOSIS — C44310 Basal cell carcinoma of skin of unspecified parts of face: Secondary | ICD-10-CM | POA: Diagnosis not present

## 2014-11-07 DIAGNOSIS — Z4802 Encounter for removal of sutures: Secondary | ICD-10-CM | POA: Diagnosis not present

## 2014-11-17 DIAGNOSIS — N8110 Cystocele, unspecified: Secondary | ICD-10-CM | POA: Diagnosis not present

## 2014-11-26 ENCOUNTER — Other Ambulatory Visit: Payer: Self-pay | Admitting: Cardiovascular Disease

## 2014-11-26 NOTE — Telephone Encounter (Signed)
Rx(s) sent to pharmacy electronically.  

## 2014-12-17 ENCOUNTER — Other Ambulatory Visit: Payer: Self-pay | Admitting: Cardiovascular Disease

## 2014-12-17 NOTE — Telephone Encounter (Signed)
Rx(s) sent to pharmacy electronically.  

## 2014-12-19 DIAGNOSIS — N814 Uterovaginal prolapse, unspecified: Secondary | ICD-10-CM | POA: Diagnosis not present

## 2014-12-24 DIAGNOSIS — R21 Rash and other nonspecific skin eruption: Secondary | ICD-10-CM | POA: Diagnosis not present

## 2014-12-24 DIAGNOSIS — I10 Essential (primary) hypertension: Secondary | ICD-10-CM | POA: Diagnosis not present

## 2014-12-24 DIAGNOSIS — I2510 Atherosclerotic heart disease of native coronary artery without angina pectoris: Secondary | ICD-10-CM | POA: Diagnosis not present

## 2014-12-24 DIAGNOSIS — E782 Mixed hyperlipidemia: Secondary | ICD-10-CM | POA: Diagnosis not present

## 2014-12-24 DIAGNOSIS — K219 Gastro-esophageal reflux disease without esophagitis: Secondary | ICD-10-CM | POA: Insufficient documentation

## 2014-12-24 DIAGNOSIS — E119 Type 2 diabetes mellitus without complications: Secondary | ICD-10-CM | POA: Diagnosis not present

## 2015-01-30 DIAGNOSIS — N814 Uterovaginal prolapse, unspecified: Secondary | ICD-10-CM | POA: Diagnosis not present

## 2015-02-24 DIAGNOSIS — N814 Uterovaginal prolapse, unspecified: Secondary | ICD-10-CM | POA: Diagnosis not present

## 2015-03-26 ENCOUNTER — Other Ambulatory Visit: Payer: Self-pay | Admitting: Cardiovascular Disease

## 2015-03-26 DIAGNOSIS — I2510 Atherosclerotic heart disease of native coronary artery without angina pectoris: Secondary | ICD-10-CM | POA: Diagnosis not present

## 2015-03-26 DIAGNOSIS — M7541 Impingement syndrome of right shoulder: Secondary | ICD-10-CM | POA: Diagnosis not present

## 2015-03-26 DIAGNOSIS — I10 Essential (primary) hypertension: Secondary | ICD-10-CM | POA: Diagnosis not present

## 2015-03-26 DIAGNOSIS — Z789 Other specified health status: Secondary | ICD-10-CM | POA: Insufficient documentation

## 2015-03-26 DIAGNOSIS — E119 Type 2 diabetes mellitus without complications: Secondary | ICD-10-CM | POA: Diagnosis not present

## 2015-03-26 NOTE — Telephone Encounter (Signed)
Rx(s) sent to pharmacy electronically.  

## 2015-03-27 DIAGNOSIS — N8110 Cystocele, unspecified: Secondary | ICD-10-CM | POA: Diagnosis not present

## 2015-04-17 ENCOUNTER — Other Ambulatory Visit: Payer: Self-pay | Admitting: Cardiovascular Disease

## 2015-04-17 ENCOUNTER — Telehealth: Payer: Self-pay | Admitting: Cardiovascular Disease

## 2015-04-17 DIAGNOSIS — Z1289 Encounter for screening for malignant neoplasm of other sites: Secondary | ICD-10-CM | POA: Diagnosis not present

## 2015-04-17 DIAGNOSIS — Z1231 Encounter for screening mammogram for malignant neoplasm of breast: Secondary | ICD-10-CM | POA: Diagnosis not present

## 2015-04-17 MED ORDER — PRAVASTATIN SODIUM 20 MG PO TABS: 20.0000 mg | ORAL_TABLET | Freq: Every evening | ORAL | Status: DC

## 2015-04-17 NOTE — Telephone Encounter (Signed)
°*  STAT* If patient is at the pharmacy, call can be transferred to refill team.   1. Which medications need to be refilled? (please list name of each medication and dose if known) Pravastatin 20mg ( 1 tab po qPM)  2. Which pharmacy/location (including street and city if local pharmacy) is medication to be sent to? [facility]   3. Do they need a 30 day or 90 day supply? [place]

## 2015-04-23 ENCOUNTER — Telehealth: Payer: Self-pay | Admitting: Cardiovascular Disease

## 2015-04-23 MED ORDER — PRAVASTATIN SODIUM 20 MG PO TABS: 20.0000 mg | ORAL_TABLET | Freq: Every evening | ORAL | Status: DC

## 2015-04-23 NOTE — Telephone Encounter (Signed)
°*  STAT* If patient is at the pharmacy, call can be transferred to refill team.   1. Which medications need to be refilled? (please list name of each medication and dose if known) Pravastatin 20mg    2. Which pharmacy/location (including street and city if local pharmacy) is medication to be sent to? [facility](Not [facility])  3. Do they need a 30 day or 90 day supply? [place]

## 2015-04-27 DIAGNOSIS — R399 Unspecified symptoms and signs involving the genitourinary system: Secondary | ICD-10-CM | POA: Diagnosis not present

## 2015-04-27 DIAGNOSIS — I2510 Atherosclerotic heart disease of native coronary artery without angina pectoris: Secondary | ICD-10-CM | POA: Diagnosis not present

## 2015-05-12 DIAGNOSIS — N814 Uterovaginal prolapse, unspecified: Secondary | ICD-10-CM | POA: Diagnosis not present

## 2015-05-15 DIAGNOSIS — H25812 Combined forms of age-related cataract, left eye: Secondary | ICD-10-CM | POA: Diagnosis not present

## 2015-05-15 DIAGNOSIS — H2511 Age-related nuclear cataract, right eye: Secondary | ICD-10-CM | POA: Diagnosis not present

## 2015-05-15 DIAGNOSIS — H11153 Pinguecula, bilateral: Secondary | ICD-10-CM | POA: Diagnosis not present

## 2015-05-19 ENCOUNTER — Other Ambulatory Visit: Payer: Self-pay | Admitting: Cardiovascular Disease

## 2015-05-19 MED ORDER — CLOPIDOGREL BISULFATE 75 MG PO TABS: 75.0000 mg | ORAL_TABLET | Freq: Every day | ORAL | Status: DC

## 2015-05-25 ENCOUNTER — Other Ambulatory Visit: Payer: Self-pay | Admitting: Cardiovascular Disease

## 2015-05-25 NOTE — Telephone Encounter (Signed)
Rx request sent to pharmacy.  

## 2015-06-02 ENCOUNTER — Ambulatory Visit (INDEPENDENT_AMBULATORY_CARE_PROVIDER_SITE_OTHER): Payer: Medicare Other | Admitting: Cardiovascular Disease

## 2015-06-02 ENCOUNTER — Encounter: Payer: Self-pay | Admitting: Cardiovascular Disease

## 2015-06-02 VITALS — BP 120/68 | HR 68 | Ht 62.0 in | Wt 132.0 lb

## 2015-06-02 DIAGNOSIS — E785 Hyperlipidemia, unspecified: Secondary | ICD-10-CM | POA: Diagnosis not present

## 2015-06-02 DIAGNOSIS — I2510 Atherosclerotic heart disease of native coronary artery without angina pectoris: Secondary | ICD-10-CM

## 2015-06-02 MED ORDER — METOPROLOL SUCCINATE ER 50 MG PO TB24: ORAL_TABLET | ORAL | Status: DC

## 2015-06-02 NOTE — Assessment & Plan Note (Signed)
History of hyperlipidemia on pravastatin followed by her PCP. We will recheck a lipid and liver profile

## 2015-06-02 NOTE — Assessment & Plan Note (Signed)
History of CAD status post RCA stenting with a Cypher drug-eluting stent by Dr. Robina Ade one E May 2006. She did have residual LAD and circumflex disease. Her last Myoview performed 09/30/09 was nonischemic. She denies chest pain or shortness of breath.

## 2015-06-02 NOTE — Patient Instructions (Signed)
Medication Instructions:   NO CHANGE  Labwork:  Your physician recommends that you return for lab work PRIOR TO EATING  Follow-Up:  Your physician wants you to follow-up in: ONE YEAR WITH DR BERRY You will receive a reminder letter in the mail two months in advance. If you don't receive a letter, please call our office to schedule the follow-up appointment.   If you need a refill on your cardiac medications before your next appointment, please call your pharmacy.    

## 2015-06-02 NOTE — Progress Notes (Signed)
06/02/2015 [name] Wright   09/02/1940  [id number]  Primary Physician Leamon Arnt, MD Primary Cardiologist: Lorretta Harp MD [name]   HPI:  The patient is a 74 year old thin and frail-appearing divorced Caucasian female, mother of 48, grandmother of 5 grandchildren, who I last saw in the office 05/27/14.[name] She has a history of CAD status post RCA stenting with a Cypher drug-eluting stent by Dr. Charolette Forward in May of 2006. She did have residual disease in her LAD and circumflex coronary arteries. Her last Myoview, performed September 30, 2009, was non Sischemic. She denies chest pain or shortness of breath. Her other problems include hypertension, hyperlipidemia, on Pravachol.. She's done well since that time and is asymptomatic   Current Outpatient Prescriptions  Medication Sig Dispense Refill  . amLODipine (NORVASC) 2.5 MG tablet Take 1 tablet (2.5 mg total) by mouth daily. 90 tablet 3  . clopidogrel (PLAVIX) 75 MG tablet Take 1 tablet (75 mg total) by mouth daily. 90 tablet 1  . Coenzyme Q10 50 MG CAPS Take by mouth.    . famotidine (PEPCID) 20 MG tablet Take 1 tablet (20 mg total) by mouth 2 (two) times daily. 20 tablet 0  . IRON PO Take 325 mg by mouth 2 (two) times daily. AS NEEDED    . loratadine (CLARITIN) 10 MG tablet Take 10 mg by mouth as needed for allergies.    . metoprolol succinate (TOPROL-XL) 50 MG 24 hr tablet TAKE 1 TABLET BY MOUTH DAILY. TAKE WITH OR IMMEDIATELY FOLLOWING A MEAL 30 tablet 0  . nitroGLYCERIN (NITROSTAT) 0.4 MG SL tablet Place 1 tablet (0.4 mg total) under the tongue every 5 (five) minutes as needed for chest pain. 25 tablet 3  . pantoprazole (PROTONIX) 40 MG tablet Take 40 mg by mouth daily. AS NEEDED    . pravastatin (PRAVACHOL) 20 MG tablet Take 1 tablet (20 mg total) by mouth every evening. 90 tablet 0   No current facility-administered medications for this visit.    Allergies  Allergen Reactions  . Altace [Ramipril] Other  (See Comments)    Tremors and increased tinnitus  . Avelox [Moxifloxacin Hcl In Nacl] Nausea Only and Other (See Comments)    Throat and tongue swelling  . Hctz [Hydrochlorothiazide] Other (See Comments)    Palpitations and tremors  . Lisinopril Other (See Comments)    Headaches  . Losartan Other (See Comments)    Palpitations and tremors  . Statins Other (See Comments)    Increased blood pressure  . Sulfa Antibiotics Rash    Social History   Social History  . Marital Status: Divorced    Spouse Name: N/A  . Number of Children: N/A  . Years of Education: N/A   Occupational History  . Not on file.   Social History Main Topics  . Smoking status: Never Smoker   . Smokeless tobacco: Not on file  . Alcohol Use: No  . Drug Use: Not on file  . Sexual Activity: Not on file   Other Topics Concern  . Not on file   Social History Narrative     Review of Systems: General: negative for chills, fever, night sweats or weight changes.  Cardiovascular: negative for chest pain, dyspnea on exertion, edema, orthopnea, palpitations, paroxysmal nocturnal dyspnea or shortness of breath Dermatological: negative for rash Respiratory: negative for cough or wheezing Urologic: negative for hematuria Abdominal: negative for nausea, vomiting, diarrhea, bright red blood per rectum, melena, or hematemesis Neurologic: negative for  visual changes, syncope, or dizziness All other systems reviewed and are otherwise negative except as noted above.    Blood pressure 120/68, pulse 68, height 5\' 2"  (1.575 m), weight 132 lb (59.875 kg).  General appearance: alert and no distress Neck: no adenopathy, no carotid bruit, no JVD, supple, symmetrical, trachea midline and thyroid not enlarged, symmetric, no tenderness/mass/nodules Lungs: clear to auscultation bilaterally Heart: regular rate and rhythm, S1, S2 normal, no murmur, click, rub or gallop Extremities: extremities normal, atraumatic, no cyanosis or  edema  EKG normal sinus rhythm at 68 without ST or T-wave changes. I personally reviewed this EKG  ASSESSMENT AND PLAN:   Hypertension History of hypertension blood pressure measurements at 120/68. She is on amlodipine and metoprolol. Continue current meds at current dosing  Hyperlipidemia History of hyperlipidemia on pravastatin followed by her PCP. We will recheck a lipid and liver profile  CAD- RCA DES 2006 History of CAD status post RCA stenting with a Cypher drug-eluting stent by Dr. Robina Ade one E May 2006. She did have residual LAD and circumflex disease. Her last Myoview performed 09/30/09 was nonischemic. She denies chest pain or shortness of breath.      Lorretta Harp MD FACP,FACC,FAHA, [facility] 06/02/2015 11:32 AM

## 2015-06-02 NOTE — Assessment & Plan Note (Signed)
History of hypertension blood pressure measurements at 120/68. She is on amlodipine and metoprolol. Continue current meds at current dosing

## 2015-06-22 DIAGNOSIS — N814 Uterovaginal prolapse, unspecified: Secondary | ICD-10-CM | POA: Diagnosis not present

## 2015-06-26 DIAGNOSIS — E782 Mixed hyperlipidemia: Secondary | ICD-10-CM | POA: Diagnosis not present

## 2015-06-26 DIAGNOSIS — I10 Essential (primary) hypertension: Secondary | ICD-10-CM | POA: Diagnosis not present

## 2015-06-26 DIAGNOSIS — I2510 Atherosclerotic heart disease of native coronary artery without angina pectoris: Secondary | ICD-10-CM | POA: Diagnosis not present

## 2015-06-26 DIAGNOSIS — E119 Type 2 diabetes mellitus without complications: Secondary | ICD-10-CM | POA: Diagnosis not present

## 2015-06-26 DIAGNOSIS — E1169 Type 2 diabetes mellitus with other specified complication: Secondary | ICD-10-CM | POA: Diagnosis not present

## 2015-07-06 ENCOUNTER — Telehealth: Payer: Self-pay | Admitting: Cardiovascular Disease

## 2015-07-06 MED ORDER — AMLODIPINE BESYLATE 2.5 MG PO TABS: 2.5000 mg | ORAL_TABLET | Freq: Every day | ORAL | Status: DC

## 2015-07-06 MED ORDER — PRAVASTATIN SODIUM 20 MG PO TABS: 20.0000 mg | ORAL_TABLET | Freq: Every evening | ORAL | Status: DC

## 2015-07-06 NOTE — Telephone Encounter (Signed)
°*  STAT* If patient is at the pharmacy, call can be transferred to refill team.   1. Which medications need to be refilled? (please list name of each medication and dose if known) Pravastatin 20 mg and Amlodipine 2.5mg    2. Which pharmacy/location (including street and city if local pharmacy) is medication to be sent to? [facility]   3. Do they need a 30 day or 90 day supply? [place]

## 2015-07-07 DIAGNOSIS — E1169 Type 2 diabetes mellitus with other specified complication: Secondary | ICD-10-CM | POA: Diagnosis not present

## 2015-07-07 DIAGNOSIS — E782 Mixed hyperlipidemia: Secondary | ICD-10-CM | POA: Diagnosis not present

## 2015-07-13 ENCOUNTER — Telehealth: Payer: Self-pay | Admitting: Cardiovascular Disease

## 2015-07-13 NOTE — Telephone Encounter (Signed)
Called patient - informed her I was unable to locate records - nothing faxed.  I contacted Dr. Tamela Oddi office and requested recent labwork to be sent to our fax. Pt aware.

## 2015-07-13 NOTE — Telephone Encounter (Signed)
She wants to know if you received her blood work from Dr Jonni Sanger? Please let her know.

## 2015-07-17 DIAGNOSIS — N814 Uterovaginal prolapse, unspecified: Secondary | ICD-10-CM | POA: Diagnosis not present

## 2015-08-11 DIAGNOSIS — L570 Actinic keratosis: Secondary | ICD-10-CM | POA: Diagnosis not present

## 2015-08-11 DIAGNOSIS — L219 Seborrheic dermatitis, unspecified: Secondary | ICD-10-CM | POA: Diagnosis not present

## 2015-08-11 DIAGNOSIS — L821 Other seborrheic keratosis: Secondary | ICD-10-CM | POA: Diagnosis not present

## 2015-08-12 DIAGNOSIS — N814 Uterovaginal prolapse, unspecified: Secondary | ICD-10-CM | POA: Diagnosis not present

## 2015-09-08 DIAGNOSIS — N814 Uterovaginal prolapse, unspecified: Secondary | ICD-10-CM | POA: Diagnosis not present

## 2015-09-24 DIAGNOSIS — I10 Essential (primary) hypertension: Secondary | ICD-10-CM | POA: Diagnosis not present

## 2015-09-24 DIAGNOSIS — I2510 Atherosclerotic heart disease of native coronary artery without angina pectoris: Secondary | ICD-10-CM | POA: Diagnosis not present

## 2015-09-24 DIAGNOSIS — E119 Type 2 diabetes mellitus without complications: Secondary | ICD-10-CM | POA: Diagnosis not present

## 2015-10-13 DIAGNOSIS — N814 Uterovaginal prolapse, unspecified: Secondary | ICD-10-CM | POA: Diagnosis not present

## 2015-10-20 DIAGNOSIS — E119 Type 2 diabetes mellitus without complications: Secondary | ICD-10-CM | POA: Diagnosis not present

## 2015-10-20 DIAGNOSIS — Z955 Presence of coronary angioplasty implant and graft: Secondary | ICD-10-CM | POA: Insufficient documentation

## 2015-10-20 DIAGNOSIS — I2510 Atherosclerotic heart disease of native coronary artery without angina pectoris: Secondary | ICD-10-CM | POA: Diagnosis not present

## 2015-10-20 DIAGNOSIS — T383X5A Adverse effect of insulin and oral hypoglycemic [antidiabetic] drugs, initial encounter: Secondary | ICD-10-CM | POA: Diagnosis not present

## 2015-10-21 ENCOUNTER — Telehealth: Payer: Self-pay | Admitting: Cardiovascular Disease

## 2015-10-21 MED ORDER — NITROGLYCERIN 0.4 MG SL SUBL: 0.4000 mg | SUBLINGUAL_TABLET | SUBLINGUAL | Status: DC | PRN

## 2015-10-21 NOTE — Telephone Encounter (Signed)
Patient directly called in to N. Rose, RN phone line and left VM Patient did not leave DOB, but provided name & phone # - how MRN was looked up in EPIC Patient did not leave message as to the nature of her call  LM for patient to call back

## 2015-10-21 NOTE — Telephone Encounter (Signed)
Patient called N. Rose, RN phone directly.  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  Patient states she would feel most comfortable having med on hand just in case.  Informed patient we can refill this medication.  Rx(s) sent to pharmacy electronically to [facility] per patient preference.

## 2015-11-13 DIAGNOSIS — N814 Uterovaginal prolapse, unspecified: Secondary | ICD-10-CM | POA: Diagnosis not present

## 2015-11-16 DIAGNOSIS — N814 Uterovaginal prolapse, unspecified: Secondary | ICD-10-CM | POA: Diagnosis not present

## 2015-12-11 DIAGNOSIS — N814 Uterovaginal prolapse, unspecified: Secondary | ICD-10-CM | POA: Diagnosis not present

## 2015-12-22 DIAGNOSIS — N814 Uterovaginal prolapse, unspecified: Secondary | ICD-10-CM | POA: Diagnosis not present

## 2015-12-23 ENCOUNTER — Other Ambulatory Visit: Payer: Self-pay | Admitting: Cardiovascular Disease

## 2015-12-23 MED ORDER — AMLODIPINE BESYLATE 2.5 MG PO TABS: 2.5000 mg | ORAL_TABLET | Freq: Every day | ORAL | Status: DC

## 2015-12-23 NOTE — Telephone Encounter (Signed)
Rx(s) sent to pharmacy electronically.  

## 2015-12-23 NOTE — Telephone Encounter (Signed)
°*  STAT* If patient is at the pharmacy, call can be transferred to refill team.   1. Which medications need to be refilled? (please list name of each medication and dose if known) Amlodipine Besylate 2.5mg  ( Needs a new Prescription sent )  2. Which pharmacy/location (including street and city if local pharmacy) is medication to be sent to?[place] .   3. Do they need a 30 day or 90 day supply?90  Patient change pharmacy

## 2015-12-25 DIAGNOSIS — E1165 Type 2 diabetes mellitus with hyperglycemia: Secondary | ICD-10-CM | POA: Diagnosis not present

## 2015-12-25 DIAGNOSIS — I10 Essential (primary) hypertension: Secondary | ICD-10-CM | POA: Diagnosis not present

## 2015-12-25 DIAGNOSIS — I2510 Atherosclerotic heart disease of native coronary artery without angina pectoris: Secondary | ICD-10-CM | POA: Diagnosis not present

## 2015-12-25 DIAGNOSIS — K2950 Unspecified chronic gastritis without bleeding: Secondary | ICD-10-CM | POA: Diagnosis not present

## 2015-12-25 DIAGNOSIS — R5383 Other fatigue: Secondary | ICD-10-CM | POA: Diagnosis not present

## 2015-12-25 DIAGNOSIS — R6889 Other general symptoms and signs: Secondary | ICD-10-CM | POA: Diagnosis not present

## 2016-01-22 DIAGNOSIS — N814 Uterovaginal prolapse, unspecified: Secondary | ICD-10-CM | POA: Diagnosis not present

## 2016-02-05 DIAGNOSIS — N814 Uterovaginal prolapse, unspecified: Secondary | ICD-10-CM | POA: Diagnosis not present

## 2016-03-02 DIAGNOSIS — D239 Other benign neoplasm of skin, unspecified: Secondary | ICD-10-CM | POA: Diagnosis not present

## 2016-03-02 DIAGNOSIS — C44519 Basal cell carcinoma of skin of other part of trunk: Secondary | ICD-10-CM | POA: Diagnosis not present

## 2016-03-02 DIAGNOSIS — C4491 Basal cell carcinoma of skin, unspecified: Secondary | ICD-10-CM

## 2016-03-02 DIAGNOSIS — M71349 Other bursal cyst, unspecified hand: Secondary | ICD-10-CM | POA: Diagnosis not present

## 2016-03-02 DIAGNOSIS — C44619 Basal cell carcinoma of skin of left upper limb, including shoulder: Secondary | ICD-10-CM | POA: Diagnosis not present

## 2016-03-02 DIAGNOSIS — L570 Actinic keratosis: Secondary | ICD-10-CM | POA: Diagnosis not present

## 2016-03-02 HISTORY — DX: Basal cell carcinoma of skin, unspecified: C44.91

## 2016-03-08 DIAGNOSIS — N814 Uterovaginal prolapse, unspecified: Secondary | ICD-10-CM | POA: Diagnosis not present

## 2016-03-29 DIAGNOSIS — N814 Uterovaginal prolapse, unspecified: Secondary | ICD-10-CM | POA: Diagnosis not present

## 2016-04-08 DIAGNOSIS — N814 Uterovaginal prolapse, unspecified: Secondary | ICD-10-CM | POA: Diagnosis not present

## 2016-04-14 DIAGNOSIS — C44619 Basal cell carcinoma of skin of left upper limb, including shoulder: Secondary | ICD-10-CM | POA: Diagnosis not present

## 2016-04-25 DIAGNOSIS — R0982 Postnasal drip: Secondary | ICD-10-CM | POA: Diagnosis not present

## 2016-04-25 DIAGNOSIS — I2510 Atherosclerotic heart disease of native coronary artery without angina pectoris: Secondary | ICD-10-CM | POA: Diagnosis not present

## 2016-04-25 DIAGNOSIS — R05 Cough: Secondary | ICD-10-CM | POA: Diagnosis not present

## 2016-04-25 DIAGNOSIS — I10 Essential (primary) hypertension: Secondary | ICD-10-CM | POA: Diagnosis not present

## 2016-05-03 DIAGNOSIS — N814 Uterovaginal prolapse, unspecified: Secondary | ICD-10-CM | POA: Diagnosis not present

## 2016-05-17 ENCOUNTER — Ambulatory Visit (INDEPENDENT_AMBULATORY_CARE_PROVIDER_SITE_OTHER): Payer: Medicare Other | Admitting: Cardiovascular Disease

## 2016-05-17 ENCOUNTER — Encounter: Payer: Self-pay | Admitting: Cardiovascular Disease

## 2016-05-17 VITALS — BP 124/70 | HR 81 | Ht 62.0 in | Wt 136.0 lb

## 2016-05-17 DIAGNOSIS — I10 Essential (primary) hypertension: Secondary | ICD-10-CM

## 2016-05-17 DIAGNOSIS — E7800 Pure hypercholesterolemia, unspecified: Secondary | ICD-10-CM

## 2016-05-17 DIAGNOSIS — I2510 Atherosclerotic heart disease of native coronary artery without angina pectoris: Secondary | ICD-10-CM

## 2016-05-17 NOTE — Assessment & Plan Note (Signed)
History of CAD status post RCA stenting stenting using a Cypher drug-eluting stent by Dr. Terrence Dupont May 2006. She did have residual disease in her LAD and circumflex coronary arteries. Her last Myoview performed 09/30/09 was nonischemic. She denies chest pain or shortness of breath.

## 2016-05-17 NOTE — Patient Instructions (Signed)

## 2016-05-17 NOTE — Assessment & Plan Note (Signed)
History of hypertension and blood pressure measured 124/70. She is on amlodipine and metoprolol. Continue current meds at current dosing

## 2016-05-17 NOTE — Assessment & Plan Note (Signed)
History of hyperlipidemia on statin therapy followed by her PCP. 

## 2016-05-17 NOTE — Progress Notes (Signed)
05/17/2016 [name] Wright   [birth year]  [id number]  Primary Physician Leamon Arnt, MD Primary Cardiologist: Lorretta Harp MD [name]  HPI:  The patient is a 75 year old thin and frail-appearing divorced Caucasian female, mother of 7, grandmother of 5 grandchildren, who I last saw in the office 06/02/15.[name] She has a history of CAD status post RCA stenting with a Cypher drug-eluting stent by Dr. Charolette Forward in May of 2006. She did have residual disease in her LAD and circumflex coronary arteries. Her last Myoview, performed September 30, 2009, was non ischemic.[name] Her other problems include hypertension, hyperlipidemia, on Pravachol.. She's done well since that time and is asymptomatic   Current Outpatient Prescriptions  Medication Sig Dispense Refill  . amLODipine (NORVASC) 2.5 MG tablet Take 1 tablet (2.5 mg total) by mouth daily. 90 tablet 1  . clopidogrel (PLAVIX) 75 MG tablet Take 1 tablet (75 mg total) by mouth daily. 90 tablet 1  . Coenzyme Q10 50 MG CAPS Take by mouth.    . famotidine (PEPCID) 20 MG tablet Take 1 tablet (20 mg total) by mouth 2 (two) times daily. 20 tablet 0  . IRON PO Take 325 mg by mouth 2 (two) times daily. AS NEEDED    . loratadine (CLARITIN) 10 MG tablet Take 10 mg by mouth as needed for allergies.    . metoprolol succinate (TOPROL-XL) 50 MG 24 hr tablet TAKE 1 TABLET BY MOUTH DAILY. TAKE WITH OR IMMEDIATELY FOLLOWING A MEAL 90 tablet 3  . nitroGLYCERIN (NITROSTAT) 0.4 MG SL tablet Place 1 tablet (0.4 mg total) under the tongue every 5 (five) minutes as needed for chest pain. 25 tablet 3  . pravastatin (PRAVACHOL) 20 MG tablet Take 1 tablet (20 mg total) by mouth every evening. 90 tablet 3   No current facility-administered medications for this visit.     Allergies  Allergen Reactions  . Altace [Ramipril] Other (See Comments)    Tremors and increased tinnitus  . Avelox [Moxifloxacin Hcl In Nacl] Nausea Only and Other (See Comments)   Throat and tongue swelling  . Hctz [Hydrochlorothiazide] Other (See Comments)    Palpitations and tremors  . Lisinopril Other (See Comments)    Headaches  . Losartan Other (See Comments)    Palpitations and tremors  . Statins Other (See Comments)    Increased blood pressure  . Sulfa Antibiotics Rash    Social History   Social History  . Marital status: Divorced    Spouse name: N/A  . Number of children: N/A  . Years of education: N/A   Occupational History  . Not on file.   Social History Main Topics  . Smoking status: Never Smoker  . Smokeless tobacco: Never Used  . Alcohol use No  . Drug use: Unknown  . Sexual activity: Not on file   Other Topics Concern  . Not on file   Social History Narrative  . No narrative on file     Review of Systems: General: negative for chills, fever, night sweats or weight changes.  Cardiovascular: negative for chest pain, dyspnea on exertion, edema, orthopnea, palpitations, paroxysmal nocturnal dyspnea or shortness of breath Dermatological: negative for rash Respiratory: negative for cough or wheezing Urologic: negative for hematuria Abdominal: negative for nausea, vomiting, diarrhea, bright red blood per rectum, melena, or hematemesis Neurologic: negative for visual changes, syncope, or dizziness All other systems reviewed and are otherwise negative except as noted above.    Blood pressure 124/70,  pulse 81, height 5\' 2"  (1.575 m), weight 136 lb (61.7 kg).  General appearance: alert and no distress Neck: no adenopathy, no carotid bruit, no JVD, supple, symmetrical, trachea midline and thyroid not enlarged, symmetric, no tenderness/mass/nodules Lungs: clear to auscultation bilaterally Heart: regular rate and rhythm, S1, S2 normal, no murmur, click, rub or gallop Extremities: extremities normal, atraumatic, no cyanosis or edema  EKG sinus rhythm at 81 without ST or T-wave changes. There were Q waves in inferior and lateral leads. I  personally reviewed this EKG  ASSESSMENT AND PLAN:   CAD- RCA DES 2006 History of CAD status post RCA stenting stenting using a Cypher drug-eluting stent by Dr. Terrence Dupont May 2006. She did have residual disease in her LAD and circumflex coronary arteries. Her last Myoview performed 09/30/09 was nonischemic. She denies chest pain or shortness of breath.  Hyperlipidemia History of hyperlipidemia on statin therapy followed by her PCP  Hypertension History of hypertension and blood pressure measured 124/70. She is on amlodipine and metoprolol. Continue current meds at current dosing      Lorretta Harp MD [facility], [facility] 05/17/2016 1:37 PM

## 2016-05-31 DIAGNOSIS — N950 Postmenopausal bleeding: Secondary | ICD-10-CM | POA: Diagnosis not present

## 2016-06-16 ENCOUNTER — Other Ambulatory Visit: Payer: Self-pay | Admitting: *Deleted

## 2016-06-16 MED ORDER — METOPROLOL SUCCINATE ER 50 MG PO TB24: ORAL_TABLET | ORAL | 3 refills | Status: DC

## 2016-06-22 ENCOUNTER — Telehealth: Payer: Self-pay | Admitting: Cardiovascular Disease

## 2016-06-22 MED ORDER — AMLODIPINE BESYLATE 2.5 MG PO TABS: 2.5000 mg | ORAL_TABLET | Freq: Every day | ORAL | 3 refills | Status: DC

## 2016-06-22 NOTE — Telephone Encounter (Signed)
°*  STAT* If patient is at the pharmacy, call can be transferred to refill team.   1. Which medications need to be refilled? (please list name of each medication and dose if known)  Amlodipine 2.5mg    2. Which pharmacy/location (including street and city if local pharmacy) is medication to be sent to?  [facility] [address]  3. Do they need a 30 day or 90 day supply? [place]

## 2016-06-22 NOTE — Telephone Encounter (Signed)
Rx sent to pharmacy electronically.   Pt made aware.

## 2016-07-06 DIAGNOSIS — R8290 Unspecified abnormal findings in urine: Secondary | ICD-10-CM | POA: Diagnosis not present

## 2016-07-06 DIAGNOSIS — N819 Female genital prolapse, unspecified: Secondary | ICD-10-CM | POA: Diagnosis not present

## 2016-07-06 DIAGNOSIS — N765 Ulceration of vagina: Secondary | ICD-10-CM | POA: Diagnosis not present

## 2016-07-06 DIAGNOSIS — N814 Uterovaginal prolapse, unspecified: Secondary | ICD-10-CM | POA: Diagnosis not present

## 2016-07-06 DIAGNOSIS — N760 Acute vaginitis: Secondary | ICD-10-CM | POA: Diagnosis not present

## 2016-07-11 DIAGNOSIS — Z1231 Encounter for screening mammogram for malignant neoplasm of breast: Secondary | ICD-10-CM | POA: Diagnosis not present

## 2016-07-11 DIAGNOSIS — N819 Female genital prolapse, unspecified: Secondary | ICD-10-CM | POA: Diagnosis not present

## 2016-07-19 DIAGNOSIS — L570 Actinic keratosis: Secondary | ICD-10-CM | POA: Diagnosis not present

## 2016-07-19 DIAGNOSIS — Z85828 Personal history of other malignant neoplasm of skin: Secondary | ICD-10-CM | POA: Diagnosis not present

## 2016-07-20 DIAGNOSIS — N812 Incomplete uterovaginal prolapse: Secondary | ICD-10-CM | POA: Diagnosis not present

## 2016-07-20 DIAGNOSIS — I2510 Atherosclerotic heart disease of native coronary artery without angina pectoris: Secondary | ICD-10-CM | POA: Diagnosis not present

## 2016-08-04 ENCOUNTER — Other Ambulatory Visit: Payer: Self-pay | Admitting: Cardiovascular Disease

## 2016-08-09 DIAGNOSIS — N814 Uterovaginal prolapse, unspecified: Secondary | ICD-10-CM | POA: Diagnosis not present

## 2016-08-10 DIAGNOSIS — Z0000 Encounter for general adult medical examination without abnormal findings: Secondary | ICD-10-CM | POA: Diagnosis not present

## 2016-08-10 DIAGNOSIS — E782 Mixed hyperlipidemia: Secondary | ICD-10-CM | POA: Diagnosis not present

## 2016-08-10 DIAGNOSIS — I10 Essential (primary) hypertension: Secondary | ICD-10-CM | POA: Diagnosis not present

## 2016-08-10 DIAGNOSIS — E1169 Type 2 diabetes mellitus with other specified complication: Secondary | ICD-10-CM | POA: Diagnosis not present

## 2016-08-10 DIAGNOSIS — E119 Type 2 diabetes mellitus without complications: Secondary | ICD-10-CM | POA: Diagnosis not present

## 2016-08-10 LAB — POCT GLYCOSYLATED HEMOGLOBIN (HGB A1C): Hemoglobin A1C: 7

## 2016-08-18 DIAGNOSIS — H11153 Pinguecula, bilateral: Secondary | ICD-10-CM | POA: Diagnosis not present

## 2016-08-18 DIAGNOSIS — E119 Type 2 diabetes mellitus without complications: Secondary | ICD-10-CM | POA: Diagnosis not present

## 2016-08-18 DIAGNOSIS — H25813 Combined forms of age-related cataract, bilateral: Secondary | ICD-10-CM | POA: Diagnosis not present

## 2016-08-19 DIAGNOSIS — E782 Mixed hyperlipidemia: Secondary | ICD-10-CM | POA: Diagnosis not present

## 2016-08-19 DIAGNOSIS — I10 Essential (primary) hypertension: Secondary | ICD-10-CM | POA: Diagnosis not present

## 2016-08-19 DIAGNOSIS — E119 Type 2 diabetes mellitus without complications: Secondary | ICD-10-CM | POA: Diagnosis not present

## 2016-08-19 DIAGNOSIS — E1169 Type 2 diabetes mellitus with other specified complication: Secondary | ICD-10-CM | POA: Diagnosis not present

## 2016-08-30 ENCOUNTER — Telehealth: Payer: Self-pay | Admitting: Cardiovascular Disease

## 2016-08-30 NOTE — Telephone Encounter (Signed)
New message   Pt states Dr. Jonni Sanger checked her cholesterol and they said that Dr. Gwenlyn Found should be able to see these results online. She said that Dr. Jonni Sanger said Dr. Gwenlyn Found can call the office for the those requested results if they cannot be seen online.

## 2016-08-31 NOTE — Telephone Encounter (Signed)
Follow up    Pt is calling back to see if Dr. Gwenlyn Found got results from cholesterol lab work.

## 2016-09-02 DIAGNOSIS — M859 Disorder of bone density and structure, unspecified: Secondary | ICD-10-CM | POA: Diagnosis not present

## 2016-09-05 ENCOUNTER — Telehealth: Payer: Self-pay | Admitting: Cardiovascular Disease

## 2016-09-05 NOTE — Telephone Encounter (Signed)
Returned phone call to the patient. She stated that she has been having joint pain and wonders if it is her pravastatin. She stated that it has been progressively getting worse over the past two years. She has stopped taking it everyday and has switched to every other. Will route to pharmacy for advice.

## 2016-09-05 NOTE — Telephone Encounter (Signed)
Patient called in January to see if we had received most recent cholesterol labs - latest ones I see are July 2017.  See if she had recent ones that we can get.  In the meantime, have her stop pravastatin for now.  She should call back in  2-3 weeks, and we can give her further recommendations at that time (will need January labs for this).

## 2016-09-05 NOTE — Telephone Encounter (Signed)
New Message     Please call pt she has several concerns prior to her having surgery, one is her cholesterol medication :   Pt c/o medication issue:  1. Name of Medication:   pravastatin (PRAVACHOL) 20 MG tablet TAKE 1 TABLET EVERY EVENING     2. How are you currently taking this medication (dosage and times per day)? As prescribed   3. Are you having a reaction (difficulty breathing--STAT)? no  4. What is your medication issue? Joint and muscle pain

## 2016-09-05 NOTE — Telephone Encounter (Signed)
Called the patient back to instruct her to hold her pravastatin for 2-3 weeks and then call us back to let us know how her symptoms were at that point. She stated that she understood.  Call has been placed to Dr. Tamela Oddi office to have her current labs faxed over.

## 2016-09-06 DIAGNOSIS — N814 Uterovaginal prolapse, unspecified: Secondary | ICD-10-CM | POA: Diagnosis not present

## 2016-09-06 DIAGNOSIS — Z131 Encounter for screening for diabetes mellitus: Secondary | ICD-10-CM | POA: Diagnosis not present

## 2016-09-07 ENCOUNTER — Telehealth: Payer: Self-pay | Admitting: Cardiovascular Disease

## 2016-09-07 NOTE — Telephone Encounter (Signed)
Lab results received from Dr. Billey Chang at [facility].  Signed and placed to be scanned.

## 2016-09-09 NOTE — Telephone Encounter (Signed)
Pt returned call. Explained to pt that Dr. Gwenlyn Found is out of the office until the week of April 16, but that I will call her as soon as he reviews her results. Pt verbalized thanks and understanding.

## 2016-09-09 NOTE — Telephone Encounter (Signed)
lmtcb

## 2016-09-13 DIAGNOSIS — N814 Uterovaginal prolapse, unspecified: Secondary | ICD-10-CM | POA: Diagnosis not present

## 2016-09-26 ENCOUNTER — Telehealth: Payer: Self-pay | Admitting: Cardiovascular Disease

## 2016-09-26 NOTE — Telephone Encounter (Signed)
Patient calling states that she was instructed to call back and speak with nurse in regards to blood work. Please call,thanks.

## 2016-09-26 NOTE — Telephone Encounter (Signed)
Spoke with pt, she had labs done with dr  Elvis Coil office and wants to make sure we got those and dr berry has reviewed. Explained I do not have access to paperwork and dr berry's assistant and will have her call the patient back. The patient is concerned because her HgbA1c and cholesterol was elevated and she wants to know what dr berry thought. Aware will forward the information to taylor.

## 2016-09-27 NOTE — Telephone Encounter (Signed)
Spoke to pt to inform her that we did receive her recent lab work from Dr. Rogue Bussing, which was scanned into her chart. I will route this to Dr. Gwenlyn Found to review labs--some also received from [facility] that include recent lipid panel. Told pt I will call her back with any recommendations from Dr. Gwenlyn Found.  Pt verbalized thanks.  Pt also stated she is going to have surgery for vaginal prolapse and wanted to know if she needs to come in for an appt. I called Dr. Erin Hearing (Urogynecologist with [facility]) office to ask if they would send a surgical clearance form, but had to leave a message.

## 2016-10-03 ENCOUNTER — Telehealth: Payer: Self-pay | Admitting: Cardiovascular Disease

## 2016-10-03 NOTE — Telephone Encounter (Signed)
New message    Pt is calling to find out if blood work results were received from Dr. Rogue Bussing.

## 2016-10-03 NOTE — Telephone Encounter (Signed)
Returned call to patient-advised we have received labs (per telephone note 4/17) and we are awaiting Dr. Gwenlyn Found to review.  Advised we would call if Dr. Gwenlyn Found has any recommendations.     Patient aware and verbalized understanding.

## 2016-10-04 DIAGNOSIS — N814 Uterovaginal prolapse, unspecified: Secondary | ICD-10-CM | POA: Diagnosis not present

## 2016-11-02 DIAGNOSIS — I2510 Atherosclerotic heart disease of native coronary artery without angina pectoris: Secondary | ICD-10-CM | POA: Diagnosis not present

## 2016-11-02 DIAGNOSIS — N812 Incomplete uterovaginal prolapse: Secondary | ICD-10-CM | POA: Diagnosis not present

## 2016-11-10 ENCOUNTER — Encounter: Payer: Self-pay | Admitting: Cardiovascular Disease

## 2016-11-10 ENCOUNTER — Ambulatory Visit (INDEPENDENT_AMBULATORY_CARE_PROVIDER_SITE_OTHER): Payer: Medicare Other | Admitting: Cardiovascular Disease

## 2016-11-10 VITALS — BP 140/68 | HR 70 | Ht 62.0 in | Wt 133.2 lb

## 2016-11-10 DIAGNOSIS — I10 Essential (primary) hypertension: Secondary | ICD-10-CM | POA: Diagnosis not present

## 2016-11-10 DIAGNOSIS — E7800 Pure hypercholesterolemia, unspecified: Secondary | ICD-10-CM

## 2016-11-10 DIAGNOSIS — I2510 Atherosclerotic heart disease of native coronary artery without angina pectoris: Secondary | ICD-10-CM | POA: Diagnosis not present

## 2016-11-10 NOTE — Assessment & Plan Note (Signed)
History of hypertension with blood pressure measured at 140/68. She is on low-dose amlodipine and metoprolol. Continue current meds at current dosing.

## 2016-11-10 NOTE — Assessment & Plan Note (Signed)
History of CAD status post RCA stenting with a Cypher drug-eluting stent by Dr. Terrence Dupont May 2006. She did have residual disease in her LAD and circumflex. Last Myoview performed 09/30/09 was nonischemic. She denies chest pain or shortness of breath. She may require GYN surgery in the near future. If so, she will need a form classic Myoview stress test to risk stratify her  Since it has been 7 years since her last study.

## 2016-11-10 NOTE — Patient Instructions (Signed)

## 2016-11-10 NOTE — Progress Notes (Signed)
11/10/2016 [name] Wright   04/20/1941  [id number]  Primary Physician [name], MD Primary Cardiologist: Lorretta Harp MD Lupe Carney, [place]  HPI:  The patient is a 76 year old thin and frail-appearing divorced Caucasian female, mother of 101, grandmother of 5 grandchildren, who I last saw in the office 05/17/16.[name] She has a history of CAD status post RCA stenting with a Cypher drug-eluting stent by Dr. Charolette Forward in May of 2006. She did have residual disease in her LAD and circumflex coronary arteries. Her last Myoview, performed September 30, 2009, was non ischemic.[name] Her other problems include hypertension, hyperlipidemia, on Pravachol.. She's done well since that time and is asymptomatic. She apparently may need GYN surgery in the near future.    Current Outpatient Prescriptions  Medication Sig Dispense Refill  . amLODipine (NORVASC) 2.5 MG tablet Take 1 tablet (2.5 mg total) by mouth daily. 90 tablet 3  . clopidogrel (PLAVIX) 75 MG tablet Take 1 tablet (75 mg total) by mouth daily. 90 tablet 1  . Coenzyme Q10 50 MG CAPS Take by mouth.    . famotidine (PEPCID) 20 MG tablet Take 1 tablet (20 mg total) by mouth 2 (two) times daily. 20 tablet 0  . loratadine (CLARITIN) 10 MG tablet Take 10 mg by mouth as needed for allergies.    . metoprolol succinate (TOPROL-XL) 50 MG 24 hr tablet TAKE 1 TABLET BY MOUTH DAILY. TAKE WITH OR IMMEDIATELY FOLLOWING A MEAL 90 tablet 3  . nitroGLYCERIN (NITROSTAT) 0.4 MG SL tablet Place 1 tablet (0.4 mg total) under the tongue every 5 (five) minutes as needed for chest pain. 25 tablet 3  . pravastatin (PRAVACHOL) 20 MG tablet TAKE 1 TABLET EVERY EVENING 90 tablet 3   No current facility-administered medications for this visit.     Allergies  Allergen Reactions  . Altace [Ramipril] Other (See Comments)    Tremors and increased tinnitus  . Avelox [Moxifloxacin Hcl In Nacl] Nausea Only and Other (See Comments)    Throat and tongue swelling  .  Hctz [Hydrochlorothiazide] Other (See Comments)    Palpitations and tremors  . Lisinopril Other (See Comments)    Headaches  . Losartan Other (See Comments)    Palpitations and tremors  . Statins Other (See Comments)    Increased blood pressure  . Sulfa Antibiotics Rash    Social History   Social History  . Marital status: Divorced    Spouse name: N/A  . Number of children: N/A  . Years of education: N/A   Occupational History  . Not on file.   Social History Main Topics  . Smoking status: Never Smoker  . Smokeless tobacco: Never Used  . Alcohol use No  . Drug use: Unknown  . Sexual activity: Not on file   Other Topics Concern  . Not on file   Social History Narrative  . No narrative on file     Review of Systems: General: negative for chills, fever, night sweats or weight changes.  Cardiovascular: negative for chest pain, dyspnea on exertion, edema, orthopnea, palpitations, paroxysmal nocturnal dyspnea or shortness of breath Dermatological: negative for rash Respiratory: negative for cough or wheezing Urologic: negative for hematuria Abdominal: negative for nausea, vomiting, diarrhea, bright red blood per rectum, melena, or hematemesis Neurologic: negative for visual changes, syncope, or dizziness All other systems reviewed and are otherwise negative except as noted above.    Blood pressure 140/68, pulse 70, height 5\' 2"  (1.575 m), weight 133  lb 3.2 oz (60.4 kg).  General appearance: alert and no distress Neck: no adenopathy, no carotid bruit, no JVD, supple, symmetrical, trachea midline and thyroid not enlarged, symmetric, no tenderness/mass/nodules Lungs: clear to auscultation bilaterally Heart: regular rate and rhythm, S1, S2 normal, no murmur, click, rub or gallop Extremities: extremities normal, atraumatic, no cyanosis or edema  EKG Sinus rhythm at 70 without ST or T-wave changes. I personally reviewed this EKG  ASSESSMENT AND PLAN:   CAD- RCA DES  2006 History of CAD status post RCA stenting with a Cypher drug-eluting stent by Dr. Terrence Dupont May 2006. She did have residual disease in her LAD and circumflex. Last Myoview performed 09/30/09 was nonischemic. She denies chest pain or shortness of breath. She may require GYN surgery in the near future. If so, she will need a form classic Myoview stress test to risk stratify her  Since it has been 7 years since her last study.  Hyperlipidemia History of hyperlipidemia on statin therapy with recent lipid profile performed by her PCP 08/19/16 related to cholesterol 170, LDL 86 and HDL of 43.  Hypertension History of hypertension with blood pressure measured at 140/68. She is on low-dose amlodipine and metoprolol. Continue current meds at current dosing.      Lorretta Harp MD FACP,FACC,FAHA, [facility] 11/10/2016 3:56 PM

## 2016-11-10 NOTE — Assessment & Plan Note (Signed)
History of hyperlipidemia on statin therapy with recent lipid profile performed by her PCP 08/19/16 related to cholesterol 170, LDL 86 and HDL of 43.

## 2016-11-11 DIAGNOSIS — M8580 Other specified disorders of bone density and structure, unspecified site: Secondary | ICD-10-CM | POA: Diagnosis not present

## 2016-11-11 DIAGNOSIS — Z800 Family history of malignant neoplasm of digestive organs: Secondary | ICD-10-CM | POA: Diagnosis not present

## 2016-11-11 DIAGNOSIS — E784 Other hyperlipidemia: Secondary | ICD-10-CM | POA: Diagnosis not present

## 2016-11-11 DIAGNOSIS — I10 Essential (primary) hypertension: Secondary | ICD-10-CM | POA: Diagnosis not present

## 2016-11-11 DIAGNOSIS — N814 Uterovaginal prolapse, unspecified: Secondary | ICD-10-CM | POA: Diagnosis not present

## 2016-11-11 DIAGNOSIS — I2510 Atherosclerotic heart disease of native coronary artery without angina pectoris: Secondary | ICD-10-CM | POA: Diagnosis not present

## 2016-11-11 DIAGNOSIS — Z1389 Encounter for screening for other disorder: Secondary | ICD-10-CM | POA: Diagnosis not present

## 2016-11-11 DIAGNOSIS — Z6824 Body mass index (BMI) 24.0-24.9, adult: Secondary | ICD-10-CM | POA: Diagnosis not present

## 2016-11-11 DIAGNOSIS — E1151 Type 2 diabetes mellitus with diabetic peripheral angiopathy without gangrene: Secondary | ICD-10-CM | POA: Diagnosis not present

## 2016-11-24 DIAGNOSIS — Z1211 Encounter for screening for malignant neoplasm of colon: Secondary | ICD-10-CM | POA: Diagnosis not present

## 2016-11-24 DIAGNOSIS — Z1212 Encounter for screening for malignant neoplasm of rectum: Secondary | ICD-10-CM | POA: Diagnosis not present

## 2016-12-02 DIAGNOSIS — N814 Uterovaginal prolapse, unspecified: Secondary | ICD-10-CM | POA: Diagnosis not present

## 2016-12-20 DIAGNOSIS — H2513 Age-related nuclear cataract, bilateral: Secondary | ICD-10-CM | POA: Diagnosis not present

## 2016-12-20 DIAGNOSIS — H25043 Posterior subcapsular polar age-related cataract, bilateral: Secondary | ICD-10-CM | POA: Diagnosis not present

## 2016-12-20 DIAGNOSIS — H02839 Dermatochalasis of unspecified eye, unspecified eyelid: Secondary | ICD-10-CM | POA: Diagnosis not present

## 2016-12-20 DIAGNOSIS — H25013 Cortical age-related cataract, bilateral: Secondary | ICD-10-CM | POA: Diagnosis not present

## 2016-12-20 DIAGNOSIS — H2512 Age-related nuclear cataract, left eye: Secondary | ICD-10-CM | POA: Diagnosis not present

## 2016-12-22 ENCOUNTER — Telehealth: Payer: Self-pay | Admitting: Cardiovascular Disease

## 2016-12-22 NOTE — Telephone Encounter (Signed)
Requesting surgical clearance:  1. Type of surgery: Cataract Extraction w/ Intraocular Lens Implantation of the right eye followed by the left eye  2. Surgeon: not specified  3.Surgical Date:  TBD  4. Medications that need to be held: none   5. CAD: Yes  6. I will defer to:  Dr. Pearla Dubonnet Information:  [facility] Phone:  [phone] Fax:  [phone]

## 2016-12-25 NOTE — Telephone Encounter (Signed)
Cleared for opthalmologic procedures at low CV risk

## 2016-12-27 DIAGNOSIS — N814 Uterovaginal prolapse, unspecified: Secondary | ICD-10-CM | POA: Diagnosis not present

## 2016-12-27 NOTE — Telephone Encounter (Signed)
Clearance routed via Epic to # provided.

## 2017-01-12 DIAGNOSIS — B0052 Herpesviral keratitis: Secondary | ICD-10-CM | POA: Diagnosis not present

## 2017-01-14 ENCOUNTER — Ambulatory Visit (HOSPITAL_COMMUNITY)
Admission: EM | Admit: 2017-01-14 | Discharge: 2017-01-14 | Disposition: A | Discharge status: Home / Self Care | Payer: Medicare Other | Attending: Internal Medicine | Admitting: Internal Medicine

## 2017-01-14 ENCOUNTER — Encounter (HOSPITAL_COMMUNITY): Payer: Self-pay | Admitting: Family Medicine

## 2017-01-14 DIAGNOSIS — B0239 Other herpes zoster eye disease: Secondary | ICD-10-CM | POA: Diagnosis not present

## 2017-01-14 DIAGNOSIS — B0230 Zoster ocular disease, unspecified: Secondary | ICD-10-CM

## 2017-01-14 MED ORDER — GANCICLOVIR 0.15 % OP GEL: 1.0000 [drp] | Freq: Every day | OPHTHALMIC | 0 refills | Status: AC

## 2017-01-14 MED ORDER — BACITRACIN-POLYMYXIN B 500-10000 UNIT/GM OP OINT: 1.0000 "application " | TOPICAL_OINTMENT | Freq: Two times a day (BID) | OPHTHALMIC | 0 refills | Status: AC

## 2017-01-14 MED ORDER — BACITRACIN-POLYMYXIN B 500-10000 UNIT/GM OP OINT: TOPICAL_OINTMENT | Freq: Two times a day (BID) | OPHTHALMIC | Status: DC

## 2017-01-14 MED ORDER — VALACYCLOVIR HCL 1 G PO TABS: 500.0000 mg | ORAL_TABLET | Freq: Two times a day (BID) | ORAL | 0 refills | Status: AC

## 2017-01-14 MED ORDER — VALACYCLOVIR HCL 500 MG PO TABS: 500.0000 mg | ORAL_TABLET | Freq: Two times a day (BID) | ORAL | Status: DC

## 2017-01-14 MED ORDER — ONDANSETRON HCL 4 MG PO TABS: 4.0000 mg | ORAL_TABLET | Freq: Four times a day (QID) | ORAL | 0 refills | Status: AC

## 2017-01-14 MED ORDER — GANCICLOVIR 0.15 % OP GEL: 1.0000 [drp] | Freq: Every day | OPHTHALMIC | Status: DC

## 2017-01-14 MED ORDER — ONDANSETRON HCL 4 MG PO TABS: 4.0000 mg | ORAL_TABLET | Freq: Four times a day (QID) | ORAL | 0 refills | Status: DC

## 2017-01-14 NOTE — ED Triage Notes (Signed)
Pt here for shingles to the right eye. sts that she has been taking the meds and they are making her sick.

## 2017-01-14 NOTE — ED Provider Notes (Signed)
CSN: [id number]     Arrival date & time 01/14/17  1200 History   None    Chief Complaint  Patient presents with  . Herpes Zoster   (Consider location/radiation/quality/duration/timing/severity/associated sxs/prior Treatment) 76 year old female with history of CAD, DM, hyperlipidemia comes in for worsening symptoms of shingles of her right face and eye. Patient states symptoms started 3 days ago in the right eye, where she was evaluated by her optometrist. She was started on valcyclovir for treatment. She states since then, she has worsening of swelling to her right eyelid. She also has nausea and vomiting from the medication. She states she struggles with vision changes due to cataracts that needs to be removed, but states she has worsening right eye vision since shingles onset. Rash on her face extends to her nose and forehead. She has put some lotion on it due to the increased swelling and irritation.       Past Medical History:  Diagnosis Date  . Coronary artery disease    NUCLEAR STRESS TEST, 09/30/2009 - EKG negative for ischemia  . Diabetes mellitus without complication ([place])    Treated by diet  . Family history of coronary artery disease    Mother died 90  . H/O subconjunctival hemorrhage   . Hyperlipidemia   . Hypertension   . Retroperitoneal hematoma    S/P   Past Surgical History:  Procedure Laterality Date  . CORONARY ANGIOPLASTY WITH STENT PLACEMENT  11/05/2004   RCA DES  . DILATION AND CURETTAGE OF UTERUS    . TONSILECTOMY/ADENOIDECTOMY WITH MYRINGOTOMY    . TUBAL LIGATION     Family History  Problem Relation Age of Onset  . Heart attack Mother 45  . Cancer Father        Colon  . Heart attack Maternal Grandmother 61   Social History  Substance Use Topics  . Smoking status: Never Smoker  . Smokeless tobacco: Never Used  . Alcohol use No   OB History    No data available     Review of Systems  Reason unable to perform ROS: See HPI as above.     Allergies  Altace [ramipril]; Avelox [moxifloxacin hcl in nacl]; Hctz [hydrochlorothiazide]; Lisinopril; Losartan; Statins; and Sulfa antibiotics  Home Medications   Prior to Admission medications   Medication Sig Start Date End Date Taking? Authorizing Provider  amLODipine (NORVASC) 2.5 MG tablet Take 1 tablet (2.5 mg total) by mouth daily. 06/22/16   Lorretta Harp, MD  bacitracin-polymyxin b (POLYSPORIN) ophthalmic ointment Place 1 application into the right eye every 12 (twelve) hours. apply to eye every 12 hrs while awake, place on skin around the eye on the lesions. 01/14/17 01/21/17  Ok Edwards, PA-C  clopidogrel (PLAVIX) 75 MG tablet Take 1 tablet (75 mg total) by mouth daily. 05/19/15   Lorretta Harp, MD  Coenzyme Q10 50 MG CAPS Take by mouth.    [provider]  famotidine (PEPCID) 20 MG tablet Take 1 tablet (20 mg total) by mouth 2 (two) times daily. 07/28/13   Kingsley Spittle, MD  Ganciclovir (ZIRGAN) 0.15 % GEL Place 1 drop into the right eye 5 (five) times daily. 01/14/17 01/21/17  Ok Edwards, PA-C  loratadine (CLARITIN) 10 MG tablet Take 10 mg by mouth as needed for allergies.    [provider]  metoprolol succinate (TOPROL-XL) 50 MG 24 hr tablet TAKE 1 TABLET BY MOUTH DAILY. TAKE WITH OR IMMEDIATELY FOLLOWING A MEAL 06/16/16  Lorretta Harp, MD  nitroGLYCERIN (NITROSTAT) 0.4 MG SL tablet Place 1 tablet (0.4 mg total) under the tongue every 5 (five) minutes as needed for chest pain. 10/21/15   Lorretta Harp, MD  ondansetron (ZOFRAN) 4 MG tablet Take 1 tablet (4 mg total) by mouth every 6 (six) hours. 01/14/17 01/21/17  Tasia Catchings, Amy V, PA-C  pravastatin (PRAVACHOL) 20 MG tablet TAKE 1 TABLET EVERY EVENING 08/04/16   Lorretta Harp, MD  valACYclovir (VALTREX) 1000 MG tablet Take 0.5 tablets (500 mg total) by mouth 2 (two) times daily. 01/14/17 01/21/17  Ok Edwards, PA-C   Meds Ordered and Administered this Visit  Medications - No data to display  BP 123/78   Pulse  90   Temp 99.3 F (37.4 C)   Resp 18   SpO2 97%  No data found.   Physical Exam  Constitutional: She is oriented to person, place, and time. She appears well-developed and well-nourished. No distress.  HENT:  Head: Normocephalic and atraumatic.  Eyes: Pupils are equal, round, and reactive to light. EOM are normal. Right conjunctiva is injected. Left conjunctiva is not injected.  Right eyelid swelling with erythema.   Neurological: She is alert and oriented to person, place, and time.  Skin:  Vesicular lesions on right forehead, that does cross midline. Vesicular lesions on right nose, does not cross midline. Dry crusting of lotion on lesions.   Psychiatric: She has a normal mood and affect. Her behavior is normal. Judgment normal.    Urgent Care Course     Procedures (including critical care time)  Labs Review Labs Reviewed - No data to display  Imaging Review No results found.   Visual Acuity Review  Right Eye Distance:   Left Eye Distance:   Bilateral Distance:    Right Eye Near:   Left Eye Near:    Bilateral Near:         MDM   1. Herpes zoster with ophthalmic complication, unspecified herpes zoster eye disease    Given patient with ophthalmic involvement of herpes zoster, discussed case with on call ophthalmologist, Dr Kristeen Miss, who came to urgent care to see patient. Patient to follow instructions of Dr Kristeen Miss with valacyclovir, zirgan, bacitracin ointment as directed. Zofran given for nausea and vomiting. Patient to follow up with Dr Kristeen Miss as scheduled. 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    Ok Edwards, PA-C 01/14/17 2252

## 2017-01-14 NOTE — Consult Note (Signed)
Reason for Consult: Herpes Zoster Right Side Referring Physician: Rozanna Box RN  [name] Wright is an 76 y.o. female.  HPI: 65 F with hx of starting to have a slight right sided periorbital irritation. She thought she hit her right eye in the shower. Then it developed into a black eye. She was seen by her optom in [place] who started her on Acyclovir. The patient reports that she has been feeling sick and feverish and very nauseaus since she started the medication. The patient has no other ocular hx, but reports that now the vision on the right side is blurry. She also reports a right sided headache but no pain in the eye. No photophobia.  Past Medical History:  Diagnosis Date  . Coronary artery disease    NUCLEAR STRESS TEST, 09/30/2009 - EKG negative for ischemia  . Diabetes mellitus without complication ([place])    Treated by diet  . Family history of coronary artery disease    Mother died 43  . H/O subconjunctival hemorrhage   . Hyperlipidemia   . Hypertension   . Retroperitoneal hematoma    S/P    Past Surgical History:  Procedure Laterality Date  . CORONARY ANGIOPLASTY WITH STENT PLACEMENT  11/05/2004   RCA DES  . DILATION AND CURETTAGE OF UTERUS    . TONSILECTOMY/ADENOIDECTOMY WITH MYRINGOTOMY    . TUBAL LIGATION      Family History  Problem Relation Age of Onset  . Heart attack Mother 63  . Cancer Father        Colon  . Heart attack Maternal Grandmother 61    Social History:  reports that she has never smoked. She has never used smokeless tobacco. She reports that she does not drink alcohol. Her drug history is not on file.  Allergies:  Allergies  Allergen Reactions  . Altace [Ramipril] Other (See Comments)    Tremors and increased tinnitus  . Avelox [Moxifloxacin Hcl In Nacl] Nausea Only and Other (See Comments)    Throat and tongue swelling  . Hctz [Hydrochlorothiazide] Other (See Comments)    Palpitations and tremors  . Lisinopril Other (See Comments)    Headaches   . Losartan Other (See Comments)    Palpitations and tremors  . Statins Other (See Comments)    Increased blood pressure  . Sulfa Antibiotics Rash    Medications: I have reviewed the patient's current medications. Prior to Admission:  (Not in a hospital admission) Scheduled: Continuous: PRN:  No results found for this or any previous visit (from the past 48 hour(s)).  No results found.  ROS Blood pressure 123/78, pulse 90, temperature 99.3 F (37.4 C), resp. rate 18, SpO2 97 %. Physical Exam  Constitutional: She appears well-developed and well-nourished.  Eyes: Right eye exhibits chemosis, discharge and exudate. Right eye exhibits no hordeolum. No foreign body present in the right eye. Left eye exhibits no chemosis, no discharge, no exudate and no hordeolum. No foreign body present in the left eye. Right conjunctiva is injected. Right conjunctiva has no hemorrhage. Left conjunctiva is not injected. Left conjunctiva has no hemorrhage. No scleral icterus. Right eye exhibits normal extraocular motion and no nystagmus. Left eye exhibits normal extraocular motion and no nystagmus. Right pupil is round and reactive. Left pupil is round and reactive.  Fundoscopic exam:      The right eye shows no arteriolar narrowing, no AV nicking, no exudate, no hemorrhage and no papilledema. The right eye shows red reflex.  The left eye shows no arteriolar narrowing, no AV nicking, no exudate, no hemorrhage and no papilledema. The left eye shows red reflex.  Slit lamp exam:      The right eye shows fluorescein uptake. The right eye shows no corneal abrasion, no corneal flare, no corneal ulcer, no foreign body, no hyphema and no hypopyon.       The left eye shows no corneal abrasion, no corneal flare, no corneal ulcer, no foreign body, no hyphema, no hypopyon and no fluorescein uptake.    VA 20/200 OU [facility] 18 OD, 10 OS Fundus no RD/RT/holes, no vitritis noted C/D .4  OU  Assessment/Plan: Herpes Zoster Ophthalmicus -Corneal Involvement Noted 1. Patient should be continued on Acyclovir PO 800MG  PO 5x/Day for 7D or consider switching to Valacyclovir 1000mg  PO q8h for 7 Days 2. Start Zirgan 0.15% 1gtt in right eye 5x/day 3  Start Bacitracin Ophthalmic Ointment on skin lesions BID for 1 week.  4. Patient having some nausea unsure if related to meds or illness, pt can be given nausea medication  Explained diagnosis and tx to patient and family member, they understand the dx and treatment and agree with the plan.   Patient to [facility] on Monday at [place] for [place] check, [facility], Georjean Mode MD [place], [place] [id number]  Patient should be scheduled to see primary care physician to manage pain and post herpetic nueralgia  Clista Bernhardt 01/14/2017, 1:46 PM

## 2017-01-14 NOTE — Discharge Instructions (Signed)
Start ocular medicines as directed. Continue course of valacyclovir as directed. Take Zofran as needed for nausea and vomiting. Follow-up with primary care for further management of herpes zoster.

## 2017-01-17 ENCOUNTER — Telehealth (HOSPITAL_COMMUNITY): Payer: Self-pay | Admitting: *Deleted

## 2017-01-17 NOTE — Telephone Encounter (Signed)
Son called concerning prescription for bacitracin ointment, states has almost ran out. Patient was prescription for neosproyn, okayed patient to use that on face to help with scarring from the herpes blisters.  Patient also reports constipation. Discussed with son over the counter medications, stool softner, and water intake.

## 2017-01-29 ENCOUNTER — Encounter (HOSPITAL_COMMUNITY): Payer: Self-pay

## 2017-01-29 ENCOUNTER — Emergency Department (HOSPITAL_COMMUNITY)
Admission: EM | Admit: 2017-01-29 | Discharge: 2017-01-29 | Disposition: A | Discharge status: Home / Self Care | Payer: Medicare Other | Attending: Emergency Medicine | Admitting: Emergency Medicine

## 2017-01-29 DIAGNOSIS — I10 Essential (primary) hypertension: Secondary | ICD-10-CM | POA: Insufficient documentation

## 2017-01-29 DIAGNOSIS — Z79899 Other long term (current) drug therapy: Secondary | ICD-10-CM | POA: Diagnosis not present

## 2017-01-29 DIAGNOSIS — I2510 Atherosclerotic heart disease of native coronary artery without angina pectoris: Secondary | ICD-10-CM | POA: Insufficient documentation

## 2017-01-29 DIAGNOSIS — E785 Hyperlipidemia, unspecified: Secondary | ICD-10-CM | POA: Insufficient documentation

## 2017-01-29 DIAGNOSIS — E119 Type 2 diabetes mellitus without complications: Secondary | ICD-10-CM | POA: Insufficient documentation

## 2017-01-29 DIAGNOSIS — N390 Urinary tract infection, site not specified: Secondary | ICD-10-CM | POA: Diagnosis not present

## 2017-01-29 DIAGNOSIS — R5383 Other fatigue: Secondary | ICD-10-CM | POA: Diagnosis not present

## 2017-01-29 DIAGNOSIS — R110 Nausea: Secondary | ICD-10-CM | POA: Insufficient documentation

## 2017-01-29 DIAGNOSIS — E1065 Type 1 diabetes mellitus with hyperglycemia: Secondary | ICD-10-CM | POA: Diagnosis not present

## 2017-01-29 DIAGNOSIS — R531 Weakness: Secondary | ICD-10-CM | POA: Diagnosis present

## 2017-01-29 DIAGNOSIS — B0230 Zoster ocular disease, unspecified: Secondary | ICD-10-CM

## 2017-01-29 DIAGNOSIS — B0239 Other herpes zoster eye disease: Secondary | ICD-10-CM | POA: Insufficient documentation

## 2017-01-29 DIAGNOSIS — R739 Hyperglycemia, unspecified: Secondary | ICD-10-CM | POA: Diagnosis not present

## 2017-01-29 DIAGNOSIS — R112 Nausea with vomiting, unspecified: Secondary | ICD-10-CM | POA: Diagnosis not present

## 2017-01-29 LAB — COMPREHENSIVE METABOLIC PANEL
ALBUMIN: 3.7 g/dL (ref 3.5–5.0)
ALT: 13 U/L — ABNORMAL LOW (ref 14–54)
ANION GAP: 8 (ref 5–15)
AST: 19 U/L (ref 15–41)
Alkaline Phosphatase: 53 U/L (ref 38–126)
BUN: 5 mg/dL — ABNORMAL LOW (ref 6–20)
CO2: 20 mmol/L — AB (ref 22–32)
Calcium: 8.8 mg/dL — ABNORMAL LOW (ref 8.9–10.3)
Chloride: 104 mmol/L (ref 101–111)
Creatinine, Ser: 0.65 mg/dL (ref 0.44–1.00)
GFR calc Af Amer: >60 mL/min (ref >60)
GFR calc non Af Amer: >60 mL/min (ref >60)
GLUCOSE: 217 mg/dL — AB (ref 65–99)
Potassium: 3.7 mmol/L (ref 3.5–5.1)
SODIUM: 132 mmol/L — AB (ref 135–145)
Total Bilirubin: 0.8 mg/dL (ref 0.3–1.2)
Total Protein: 6.3 g/dL — ABNORMAL LOW (ref 6.5–8.1)

## 2017-01-29 LAB — CBC WITH DIFFERENTIAL/PLATELET
BASOS PCT: 0 %
Basophils Absolute: 0 10*3/uL (ref 0.0–0.1)
EOS ABS: 0 10*3/uL (ref 0.0–0.7)
Eosinophils Relative: 0 %
HCT: 39.9 % (ref 36.0–46.0)
HEMOGLOBIN: 13.5 g/dL (ref 12.0–15.0)
Lymphocytes Relative: 23 %
Lymphs Abs: 2.2 10*3/uL (ref 0.7–4.0)
MCH: 29.9 pg (ref 26.0–34.0)
MCHC: 33.8 g/dL (ref 30.0–36.0)
MCV: 88.3 fL (ref 78.0–100.0)
MONO ABS: 0.7 10*3/uL (ref 0.1–1.0)
MONOS PCT: 7 %
NEUTROS PCT: 70 %
Neutro Abs: 6.7 10*3/uL (ref 1.7–7.7)
Platelets: 324 10*3/uL (ref 150–400)
RBC: 4.52 MIL/uL (ref 3.87–5.11)
RDW: 12.8 % (ref 11.5–15.5)
WBC: 9.7 10*3/uL (ref 4.0–10.5)

## 2017-01-29 LAB — URINALYSIS, ROUTINE W REFLEX MICROSCOPIC
BILIRUBIN URINE: NEGATIVE
Glucose, UA: >=500 mg/dL — AB
Hgb urine dipstick: NEGATIVE
KETONES UR: NEGATIVE mg/dL
NITRITE: NEGATIVE
PH: 8 (ref 5.0–8.0)
Protein, ur: NEGATIVE mg/dL
Specific Gravity, Urine: 1.003 — ABNORMAL LOW (ref 1.005–1.030)

## 2017-01-29 LAB — I-STAT TROPONIN, ED: TROPONIN I, POC: 0 ng/mL (ref 0.00–0.08)

## 2017-01-29 LAB — LIPASE, BLOOD: Lipase: 41 U/L (ref 11–51)

## 2017-01-29 MED ORDER — CEPHALEXIN 500 MG PO CAPS: 500.0000 mg | ORAL_CAPSULE | Freq: Three times a day (TID) | ORAL | 0 refills | Status: AC

## 2017-01-29 MED ORDER — SODIUM CHLORIDE 0.9 % IV BOLUS (SEPSIS)
1000.0000 mL | Freq: Once | INTRAVENOUS | Status: AC
Start: 2017-01-29 — End: 2017-01-29
  Administered 2017-01-29: 1000 mL via INTRAVENOUS

## 2017-01-29 NOTE — ED Provider Notes (Signed)
[facility] DEPT Provider Note   CSN: [id number] Arrival date & time: 01/29/17  0938     History   Chief Complaint Chief Complaint  Patient presents with  . Hyperglycemia  . Fatigue    HPI [name] Wright is a 76 y.o. female.  HPI   76 year old female with a history of coronary artery disease, diabetes, hypertension, hyperlipidemia, recent diagnosis of herpes zoster with ophthalmic involvement, presents with concern for generalized weakness and nausea. Patient reports that over the last couple weeks, since her diagnosis of shingles, she's had increased fatigue and nausea. Reports she had stopped the valacyclovir given concern that it was causing side effects. Reports that this morning, she felt even worse with severe nausea, fatigue, "just feeling sick."  Family reports that she's been sleeping 12 hours night which is unusual for her. Reports she has not been eating or drinking well. Had one episode of emesis.  Reports constipation, with no BM for approximately 2-3 days. Denies abdominal pain, chest pain, shortness of breath. Reports she has chronic postnasal drip and morning cough which is unchanged from baseline. Denies fevers, urinary symptoms. Reports that she had headache associated with her herpes zoster over that area of her face, but both the rash and headache are improving. Reports she still has some symptoms but those are also improving. Reports when she checked her sugar at home was 352, and called EMS given concern for elevated sugar, nausea and generalized weakness.   Past Medical History:  Diagnosis Date  . Coronary artery disease    NUCLEAR STRESS TEST, 09/30/2009 - EKG negative for ischemia  . Diabetes mellitus without complication ([place])    Treated by diet  . Family history of coronary artery disease    Mother died 55  . H/O subconjunctival hemorrhage   . Hyperlipidemia   . Hypertension   . Retroperitoneal hematoma    S/P    Patient Active Problem List   Diagnosis Date Noted  . CAD- RCA DES 2006   . Hyperlipidemia   . Hypertension   . Diabetes mellitus without complication [facility])     Past Surgical History:  Procedure Laterality Date  . CORONARY ANGIOPLASTY WITH STENT PLACEMENT  11/05/2004   RCA DES  . DILATION AND CURETTAGE OF UTERUS    . TONSILECTOMY/ADENOIDECTOMY WITH MYRINGOTOMY    . TUBAL LIGATION      OB History    No data available       Home Medications    Prior to Admission medications   Medication Sig Start Date End Date Taking? Authorizing Provider  amLODipine (NORVASC) 2.5 MG tablet Take 1 tablet (2.5 mg total) by mouth daily. Patient taking differently: Take 2.5 mg by mouth at bedtime.  06/22/16  Yes Lorretta Harp, MD  clopidogrel (PLAVIX) 75 MG tablet Take 1 tablet (75 mg total) by mouth daily. Patient taking differently: Take 75 mg by mouth 3 (three) times a week.  05/19/15  Yes Lorretta Harp, MD  COENZYME Q10 PO Take 1 capsule by mouth daily.    Yes [provider]  famotidine (PEPCID) 20 MG tablet Take 1 tablet (20 mg total) by mouth 2 (two) times daily. Patient taking differently: Take 20 mg by mouth daily.  07/28/13  Yes Kingsley Spittle, MD  loratadine (CLARITIN) 10 MG tablet Take 10 mg by mouth daily as needed for allergies.    Yes [provider]  metoprolol succinate (TOPROL-XL) 50 MG 24 hr tablet TAKE 1 TABLET BY MOUTH DAILY. TAKE  WITH OR IMMEDIATELY FOLLOWING A MEAL Patient taking differently: Take 50 mg by mouth daily.  06/16/16  Yes Lorretta Harp, MD  pravastatin (PRAVACHOL) 20 MG tablet TAKE 1 TABLET EVERY EVENING Patient taking differently: TAKE 20 MG BY MOUTH EVERY EVENING 08/04/16  Yes Lorretta Harp, MD  cephALEXin (KEFLEX) 500 MG capsule Take 1 capsule (500 mg total) by mouth 3 (three) times daily. 01/29/17 02/05/17  Gareth Morgan, MD  nitroGLYCERIN (NITROSTAT) 0.4 MG SL tablet Place 1 tablet (0.4 mg total) under the tongue every 5 (five) minutes as needed for chest pain.  10/21/15   Lorretta Harp, MD    Family History Family History  Problem Relation Age of Onset  . Heart attack Mother 67  . Cancer Father        Colon  . Heart attack Maternal Grandmother 61    Social History Social History  Substance Use Topics  . Smoking status: Never Smoker  . Smokeless tobacco: Never Used  . Alcohol use No     Allergies   Altace [ramipril]; Avelox [moxifloxacin hcl in nacl]; Hctz [hydrochlorothiazide]; Lisinopril; Losartan; Statins; and Sulfa antibiotics   Review of Systems Review of Systems  Constitutional: Positive for fatigue. Negative for fever.  HENT: Negative for sore throat.   Eyes: Negative for visual disturbance.  Respiratory: Negative for cough (unchanged baseline morning cough per pt) and shortness of breath.   Cardiovascular: Negative for chest pain.  Gastrointestinal: Positive for constipation, nausea and vomiting (one episode). Negative for abdominal pain and diarrhea.  Genitourinary: Negative for difficulty urinating and dysuria.  Musculoskeletal: Negative for back pain and neck pain.  Skin: Negative for rash.  Neurological: Positive for headaches (improved). Negative for syncope.     Physical Exam Updated Vital Signs BP (!) 149/91 (BP Location: Right Arm)   Pulse 69   Temp 98.9 F (37.2 C) (Oral)   Resp (!) 21   Ht 5' (1.524 m)   Wt 60.8 kg (134 lb)   SpO2 100%   BMI 26.17 kg/m   Physical Exam  Constitutional: She is oriented to person, place, and time. She appears well-developed and well-nourished. No distress.  HENT:  Head: Normocephalic and atraumatic.  Eyes: EOM are normal.  Right eye with conjunctival injection   Neck: Normal range of motion.  Cardiovascular: Normal rate, regular rhythm, normal heart sounds and intact distal pulses.  Exam reveals no gallop and no friction rub.   No murmur heard. Pulmonary/Chest: Effort normal and breath sounds normal. No respiratory distress. She has no wheezes. She has no  rales.  Abdominal: Soft. She exhibits no distension. There is no tenderness. There is no guarding.  Musculoskeletal: She exhibits no edema or tenderness.  Neurological: She is alert and oriented to person, place, and time.  Skin: Skin is warm and dry. No rash noted. She is not diaphoretic. No erythema.  Few healing scabs over right face, no vesicles, no surrounding erythema  Nursing note and vitals reviewed.    ED Treatments / Results  Labs (all labs ordered are listed, but only abnormal results are displayed) Labs Reviewed  COMPREHENSIVE METABOLIC PANEL - Abnormal; Notable for the following:       Result Value   Sodium 132 (*)    CO2 20 (*)    Glucose, Bld 217 (*)    BUN 5 (*)    Calcium 8.8 (*)    Total Protein 6.3 (*)    ALT 13 (*)    All other components within  normal limits  URINALYSIS, ROUTINE W REFLEX MICROSCOPIC - Abnormal; Notable for the following:    Color, Urine STRAW (*)    Specific Gravity, Urine 1.003 (*)    Glucose, UA >=500 (*)    Leukocytes, UA MODERATE (*)    Bacteria, UA RARE (*)    Squamous Epithelial / LPF 0-5 (*)    All other components within normal limits  URINE CULTURE  CBC WITH DIFFERENTIAL/PLATELET  LIPASE, BLOOD  I-STAT TROPONIN, ED    EKG  EKG Interpretation  Date/Time:  Sunday January 29 2017 10:01:41 EDT Ventricular Rate:  80 PR Interval:    QRS Duration: 91 QT Interval:  374 QTC Calculation: 432 R Axis:   41 Text Interpretation:  Sinus rhythm Probable left atrial enlargement Borderline repolarization abnormality Nonspecific repolarization abnormality new in comparison to prior Confirmed by Antonino Nienhuis ([id number]) on 01/29/2017 10:36:29 AM       Radiology No results found.  Procedures Procedures (including critical care time)  Medications Ordered in ED Medications  sodium chloride 0.9 % bolus 1,000 mL (0 mLs Intravenous Stopped 01/29/17 1215)     Initial Impression / Assessment and Plan / ED Course  I have reviewed the  triage vital signs and the nursing notes.  Pertinent labs & imaging results that were available during my care of the patient were reviewed by me and considered in my medical decision making (see chart for details).     75  year old female with a history of coronary artery disease, diabetes, hypertension, hyperlipidemia, recent diagnosis of herpes zoster with ophthalmic involvement, presents with concern for generalized weakness and nausea.  CBC shows no significant anemia. CMP shows glucose 217, bicarbonate 20, no sign of DKA or significant electrolyte abnormality.  Urinalysis shows possible infection. EKG with nonspecific changes in comparison to most recent but similar to prior. Troponin negative. No chest pain, doubt ACS.  Suspect generalized weakness secondary to patient feeling ill from herpes zoster with decreased appetite resulting in dehydration. Vital signs normal. Given IV fluids with some improvement.  Recommend zofran prn nausea, with miralax for constipation. Will give rx for keflex given UA with WBC, possible infection as contributor. Urine cx sent. Patient discharged in stable condition with understanding of reasons to return.     Final Clinical Impressions(s) / ED Diagnoses   Final diagnoses:  Other fatigue  Nausea  Urinary tract infection without hematuria, site unspecified  Herpes zoster with ophthalmic complication, unspecified herpes zoster eye disease    New Prescriptions New Prescriptions   CEPHALEXIN (KEFLEX) 500 MG CAPSULE    Take 1 capsule (500 mg total) by mouth 3 (three) times daily.     Gareth Morgan, MD 01/29/17 1334

## 2017-01-29 NOTE — ED Triage Notes (Signed)
Pt arrives EMS from home with c/o weakness and"not feeling good". Pt c/o hi blood sugar and shingles at right eye x 3 weeks. Nausea today.

## 2017-01-29 NOTE — ED Notes (Signed)
Pt state she understands instructions. Home stable via wheel chair with family,

## 2017-01-30 LAB — URINE CULTURE

## 2017-02-02 DIAGNOSIS — K5909 Other constipation: Secondary | ICD-10-CM | POA: Diagnosis not present

## 2017-02-02 DIAGNOSIS — R634 Abnormal weight loss: Secondary | ICD-10-CM | POA: Diagnosis not present

## 2017-02-02 DIAGNOSIS — Z6824 Body mass index (BMI) 24.0-24.9, adult: Secondary | ICD-10-CM | POA: Diagnosis not present

## 2017-02-02 DIAGNOSIS — R110 Nausea: Secondary | ICD-10-CM | POA: Diagnosis not present

## 2017-02-02 DIAGNOSIS — B029 Zoster without complications: Secondary | ICD-10-CM | POA: Diagnosis not present

## 2017-02-02 DIAGNOSIS — E1151 Type 2 diabetes mellitus with diabetic peripheral angiopathy without gangrene: Secondary | ICD-10-CM | POA: Diagnosis not present

## 2017-02-03 DIAGNOSIS — B0230 Zoster ocular disease, unspecified: Secondary | ICD-10-CM | POA: Diagnosis not present

## 2017-02-07 DIAGNOSIS — F329 Major depressive disorder, single episode, unspecified: Secondary | ICD-10-CM | POA: Diagnosis not present

## 2017-02-07 DIAGNOSIS — B0230 Zoster ocular disease, unspecified: Secondary | ICD-10-CM | POA: Diagnosis not present

## 2017-02-10 DIAGNOSIS — N814 Uterovaginal prolapse, unspecified: Secondary | ICD-10-CM | POA: Diagnosis not present

## 2017-02-10 DIAGNOSIS — F329 Major depressive disorder, single episode, unspecified: Secondary | ICD-10-CM | POA: Diagnosis not present

## 2017-02-10 DIAGNOSIS — I10 Essential (primary) hypertension: Secondary | ICD-10-CM | POA: Diagnosis not present

## 2017-02-10 DIAGNOSIS — E1151 Type 2 diabetes mellitus with diabetic peripheral angiopathy without gangrene: Secondary | ICD-10-CM | POA: Diagnosis not present

## 2017-02-10 DIAGNOSIS — I2510 Atherosclerotic heart disease of native coronary artery without angina pectoris: Secondary | ICD-10-CM | POA: Diagnosis not present

## 2017-02-10 DIAGNOSIS — B029 Zoster without complications: Secondary | ICD-10-CM | POA: Diagnosis not present

## 2017-02-10 DIAGNOSIS — E784 Other hyperlipidemia: Secondary | ICD-10-CM | POA: Diagnosis not present

## 2017-02-10 DIAGNOSIS — Z6823 Body mass index (BMI) 23.0-23.9, adult: Secondary | ICD-10-CM | POA: Diagnosis not present

## 2017-02-14 DIAGNOSIS — B0230 Zoster ocular disease, unspecified: Secondary | ICD-10-CM | POA: Diagnosis not present

## 2017-02-19 ENCOUNTER — Inpatient Hospital Stay (HOSPITAL_COMMUNITY)
Admission: EM | Admit: 2017-02-19 | Discharge: 2017-02-21 | DRG: 690 | Disposition: A | Discharge status: Home Health | Payer: Medicare Other | Attending: Family Medicine | Admitting: Family Medicine

## 2017-02-19 ENCOUNTER — Encounter (HOSPITAL_COMMUNITY): Payer: Self-pay

## 2017-02-19 ENCOUNTER — Inpatient Hospital Stay (HOSPITAL_COMMUNITY): Payer: Medicare Other

## 2017-02-19 DIAGNOSIS — E119 Type 2 diabetes mellitus without complications: Secondary | ICD-10-CM | POA: Diagnosis present

## 2017-02-19 DIAGNOSIS — I252 Old myocardial infarction: Secondary | ICD-10-CM | POA: Diagnosis not present

## 2017-02-19 DIAGNOSIS — Z955 Presence of coronary angioplasty implant and graft: Secondary | ICD-10-CM

## 2017-02-19 DIAGNOSIS — Z79899 Other long term (current) drug therapy: Secondary | ICD-10-CM | POA: Diagnosis not present

## 2017-02-19 DIAGNOSIS — N390 Urinary tract infection, site not specified: Secondary | ICD-10-CM | POA: Diagnosis not present

## 2017-02-19 DIAGNOSIS — B029 Zoster without complications: Secondary | ICD-10-CM | POA: Clinically undetermined

## 2017-02-19 DIAGNOSIS — E785 Hyperlipidemia, unspecified: Secondary | ICD-10-CM | POA: Diagnosis present

## 2017-02-19 DIAGNOSIS — I10 Essential (primary) hypertension: Secondary | ICD-10-CM | POA: Diagnosis present

## 2017-02-19 DIAGNOSIS — Z7902 Long term (current) use of antithrombotics/antiplatelets: Secondary | ICD-10-CM | POA: Diagnosis not present

## 2017-02-19 DIAGNOSIS — R531 Weakness: Secondary | ICD-10-CM | POA: Diagnosis not present

## 2017-02-19 DIAGNOSIS — R627 Adult failure to thrive: Secondary | ICD-10-CM | POA: Diagnosis not present

## 2017-02-19 DIAGNOSIS — R6251 Failure to thrive (child): Secondary | ICD-10-CM | POA: Diagnosis present

## 2017-02-19 DIAGNOSIS — N3000 Acute cystitis without hematuria: Secondary | ICD-10-CM

## 2017-02-19 DIAGNOSIS — Z1630 Resistance to unspecified antimicrobial drugs: Secondary | ICD-10-CM | POA: Diagnosis not present

## 2017-02-19 DIAGNOSIS — Z789 Other specified health status: Secondary | ICD-10-CM

## 2017-02-19 DIAGNOSIS — I2510 Atherosclerotic heart disease of native coronary artery without angina pectoris: Secondary | ICD-10-CM | POA: Diagnosis present

## 2017-02-19 LAB — HEPATIC FUNCTION PANEL
ALT: 11 U/L — AB (ref 14–54)
AST: 14 U/L — AB (ref 15–41)
Albumin: 3.5 g/dL (ref 3.5–5.0)
Alkaline Phosphatase: 54 U/L (ref 38–126)
Bilirubin, Direct: 0.1 mg/dL (ref 0.1–0.5)
Indirect Bilirubin: 0.6 mg/dL (ref 0.3–0.9)
Total Bilirubin: 0.7 mg/dL (ref 0.3–1.2)
Total Protein: 6.2 g/dL — ABNORMAL LOW (ref 6.5–8.1)

## 2017-02-19 LAB — URINALYSIS, ROUTINE W REFLEX MICROSCOPIC
Bilirubin Urine: NEGATIVE
GLUCOSE, UA: 50 mg/dL — AB
Hgb urine dipstick: NEGATIVE
Ketones, ur: NEGATIVE mg/dL
Nitrite: NEGATIVE
PH: 8 (ref 5.0–8.0)
Protein, ur: NEGATIVE mg/dL
Specific Gravity, Urine: 1.002 — ABNORMAL LOW (ref 1.005–1.030)

## 2017-02-19 LAB — CBC
HCT: 41.9 % (ref 36.0–46.0)
Hemoglobin: 14.1 g/dL (ref 12.0–15.0)
MCH: 30.3 pg (ref 26.0–34.0)
MCHC: 33.7 g/dL (ref 30.0–36.0)
MCV: 90.1 fL (ref 78.0–100.0)
PLATELETS: 309 10*3/uL (ref 150–400)
RBC: 4.65 MIL/uL (ref 3.87–5.11)
RDW: 13.5 % (ref 11.5–15.5)
WBC: 11 10*3/uL — AB (ref 4.0–10.5)

## 2017-02-19 LAB — BASIC METABOLIC PANEL
Anion gap: 9 (ref 5–15)
BUN: 7 mg/dL (ref 6–20)
CALCIUM: 9.4 mg/dL (ref 8.9–10.3)
CHLORIDE: 101 mmol/L (ref 101–111)
CO2: 22 mmol/L (ref 22–32)
CREATININE: 0.56 mg/dL (ref 0.44–1.00)
GFR calc non Af Amer: >60 mL/min (ref >60)
Glucose, Bld: 172 mg/dL — ABNORMAL HIGH (ref 65–99)
POTASSIUM: 3.5 mmol/L (ref 3.5–5.1)
Sodium: 132 mmol/L — ABNORMAL LOW (ref 135–145)

## 2017-02-19 LAB — TSH: TSH: 2.359 u[IU]/mL (ref 0.350–4.500)

## 2017-02-19 LAB — CBG MONITORING, ED: Glucose-Capillary: 160 mg/dL — ABNORMAL HIGH (ref 65–99)

## 2017-02-19 LAB — TROPONIN I

## 2017-02-19 LAB — I-STAT CG4 LACTIC ACID, ED: Lactic Acid, Venous: 1.8 mmol/L (ref 0.5–1.9)

## 2017-02-19 MED ORDER — ENOXAPARIN SODIUM 40 MG/0.4ML ~~LOC~~ SOLN
40.0000 mg | SUBCUTANEOUS | Status: DC
  Filled 2017-02-19 (×2): qty 0.4

## 2017-02-19 MED ORDER — SODIUM CHLORIDE 0.9 % IV BOLUS (SEPSIS)
1000.0000 mL | Freq: Once | INTRAVENOUS | Status: AC
  Administered 2017-02-19: 1000 mL via INTRAVENOUS

## 2017-02-19 MED ORDER — ONDANSETRON HCL 4 MG PO TABS: 4.0000 mg | ORAL_TABLET | Freq: Four times a day (QID) | ORAL | Status: DC | PRN

## 2017-02-19 MED ORDER — POTASSIUM CHLORIDE IN NACL 20-0.9 MEQ/L-% IV SOLN
INTRAVENOUS | Status: AC
  Administered 2017-02-19: 23:00:00 via INTRAVENOUS
  Filled 2017-02-19: qty 1000

## 2017-02-19 MED ORDER — METOPROLOL SUCCINATE ER 50 MG PO TB24
50.0000 mg | ORAL_TABLET | Freq: Every day | ORAL | Status: DC
  Administered 2017-02-20 – 2017-02-21 (×2): 50 mg via ORAL
  Filled 2017-02-19 (×2): qty 1

## 2017-02-19 MED ORDER — PRAVASTATIN SODIUM 10 MG PO TABS
20.0000 mg | ORAL_TABLET | Freq: Every evening | ORAL | Status: DC
  Administered 2017-02-20: 20 mg via ORAL
  Filled 2017-02-19 (×2): qty 2

## 2017-02-19 MED ORDER — AMLODIPINE BESYLATE 2.5 MG PO TABS
2.5000 mg | ORAL_TABLET | Freq: Every day | ORAL | Status: DC
  Administered 2017-02-19: 2.5 mg via ORAL
  Filled 2017-02-19 (×2): qty 1

## 2017-02-19 MED ORDER — LORATADINE 10 MG PO TABS: 10.0000 mg | ORAL_TABLET | Freq: Every day | ORAL | Status: DC | PRN

## 2017-02-19 MED ORDER — PROPYLENE GLYCOL 0.6 % OP SOLN: 1.0000 [drp] | Freq: Three times a day (TID) | OPHTHALMIC | Status: DC

## 2017-02-19 MED ORDER — POLYVINYL ALCOHOL 1.4 % OP SOLN
1.0000 [drp] | Freq: Three times a day (TID) | OPHTHALMIC | Status: DC
  Administered 2017-02-20 – 2017-02-21 (×3): 1 [drp] via OPHTHALMIC
  Filled 2017-02-19 (×2): qty 15

## 2017-02-19 MED ORDER — DEXTROSE 5 % IV SOLN
1.0000 g | INTRAVENOUS | Status: DC
  Filled 2017-02-19: qty 10

## 2017-02-19 MED ORDER — ACETAMINOPHEN 325 MG PO TABS
650.0000 mg | ORAL_TABLET | Freq: Four times a day (QID) | ORAL | Status: DC | PRN
  Administered 2017-02-19 – 2017-02-21 (×4): 650 mg via ORAL
  Filled 2017-02-19 (×4): qty 2

## 2017-02-19 MED ORDER — DEXTROSE 5 % IV SOLN
2.0000 g | Freq: Once | INTRAVENOUS | Status: AC
  Administered 2017-02-19: 2 g via INTRAVENOUS
  Filled 2017-02-19: qty 2

## 2017-02-19 MED ORDER — FAMOTIDINE 20 MG PO TABS
20.0000 mg | ORAL_TABLET | Freq: Every day | ORAL | Status: DC | PRN
  Administered 2017-02-19 – 2017-02-20 (×2): 20 mg via ORAL
  Filled 2017-02-19 (×2): qty 1

## 2017-02-19 MED ORDER — DULOXETINE HCL 30 MG PO CPEP
30.0000 mg | ORAL_CAPSULE | Freq: Every day | ORAL | Status: DC
  Filled 2017-02-19: qty 1

## 2017-02-19 MED ORDER — POLYETHYLENE GLYCOL 3350 17 G PO PACK
17.0000 g | PACK | Freq: Every day | ORAL | Status: DC
  Administered 2017-02-20 – 2017-02-21 (×2): 17 g via ORAL
  Filled 2017-02-19 (×3): qty 1

## 2017-02-19 MED ORDER — CLOPIDOGREL BISULFATE 75 MG PO TABS
75.0000 mg | ORAL_TABLET | Freq: Every day | ORAL | Status: DC
  Filled 2017-02-19 (×2): qty 1

## 2017-02-19 MED ORDER — PANTOPRAZOLE SODIUM 20 MG PO TBEC
20.0000 mg | DELAYED_RELEASE_TABLET | Freq: Every day | ORAL | Status: DC
  Administered 2017-02-20 – 2017-02-21 (×2): 20 mg via ORAL
  Filled 2017-02-19 (×2): qty 1

## 2017-02-19 MED ORDER — ONDANSETRON HCL 4 MG/2ML IJ SOLN: 4.0000 mg | Freq: Four times a day (QID) | INTRAMUSCULAR | Status: DC | PRN

## 2017-02-19 NOTE — ED Triage Notes (Signed)
Patient complains of ongoing weakness for 6 weeks following shingles. Reports that her pain and symptoms from shingles is resolving but concerned that the fatigue and weakness has increased. Alert and oriented, reports mild headache for weeks and has ben taking 2 ES tylenol every 6 hours. States that her MD thinks may be depression and has started on antidepressant. Decreased appetite with same and family member reports BS running in 200 range, higher than normal.

## 2017-02-19 NOTE — ED Notes (Signed)
Report attempted 

## 2017-02-19 NOTE — ED Provider Notes (Signed)
[facility] DEPT Provider Note   CSN: [id number] Arrival date & time: 02/19/17  1431     History   Chief Complaint Chief Complaint  Patient presents with  . Weakness/shingles    HPI MIRTIE BASTYR is a 76 y.o. female.  Pt developed herpes zoster ophthalmicus approximately 6 weeks ago.  She has not been feeling well since then.  The pt has been followed by her pcp and by ophthalmology and the shingles have gone away.  However, she continues to have no energy.  The pt's son has had to move in with her to help her with her ADLs.  She used to be very active and independent.  Including going [facility] 3 times a week to exercise.  Now she can barely walk.  Her son said she needs help in the shower as she can't stand long enough for a shower by herself.  Pt has been unable to get up and make meals for herself.  Her son and daughter-in-law both work, so she's not been eating until they get home.  Pt was here on 8/19 and was diagnosed with an uti.  She was put on keflex which she took, but had to stop a day early b/c she had a raging yeast infection.  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  The pt usually goes to her gynecologist monthly for this to be changed, but has been unable to do that due to the weakness.       Past Medical History:  Diagnosis Date  . Coronary artery disease    NUCLEAR STRESS TEST, 09/30/2009 - EKG negative for ischemia  . Diabetes mellitus without complication ([place])    Treated by diet  . Family history of coronary artery disease    Mother died 67  . H/O subconjunctival hemorrhage   . Hyperlipidemia   . Hypertension   . Retroperitoneal hematoma    S/P    Patient Active Problem List   Diagnosis Date Noted  . CAD- RCA DES 2006   . Hyperlipidemia   . Hypertension   . Diabetes mellitus without complication [facility])     Past Surgical History:  Procedure Laterality Date  . CORONARY ANGIOPLASTY WITH STENT PLACEMENT  11/05/2004   RCA DES  . DILATION AND CURETTAGE OF UTERUS    . TONSILECTOMY/ADENOIDECTOMY WITH MYRINGOTOMY    . TUBAL LIGATION      OB History    No data available       Home Medications    Prior to Admission medications   Medication Sig Start Date End Date Taking? Authorizing Provider  amLODipine (NORVASC) 2.5 MG tablet Take 1 tablet (2.5 mg total) by mouth daily. Patient taking differently: Take 2.5 mg by mouth at bedtime.  06/22/16  Yes Lorretta Harp, MD  clopidogrel (PLAVIX) 75 MG tablet Take 1 tablet (75 mg total) by mouth daily. Patient taking differently: Take 75 mg by mouth 3 (three) times a week. Mon. Wed and Friday. 05/19/15  Yes Lorretta Harp, MD  COENZYME Q10 PO Take 1 capsule by mouth daily.    Yes [provider]  Difluprednate (DUREZOL) 0.05 % EMUL Place 3 drops into the right eye 3 (three) times daily.   Yes [provider]  DULoxetine (CYMBALTA) 30 MG capsule Take 30 mg by mouth daily.   Yes [provider]  famotidine (PEPCID) 20 MG tablet Take 1 tablet (20 mg total) by mouth 2 (two) times  daily. Patient taking differently: Take 20 mg by mouth daily as needed.  07/28/13  Yes Kingsley Spittle, MD  loratadine (CLARITIN) 10 MG tablet Take 10 mg by mouth daily as needed for allergies.    Yes [provider]  metoprolol succinate (TOPROL-XL) 50 MG 24 hr tablet TAKE 1 TABLET BY MOUTH DAILY. TAKE WITH OR IMMEDIATELY FOLLOWING A MEAL Patient taking differently: Take 50 mg by mouth daily.  06/16/16  Yes Lorretta Harp, MD  nitroGLYCERIN (NITROSTAT) 0.4 MG SL tablet Place 1 tablet (0.4 mg total) under the tongue every 5 (five) minutes as needed for chest pain. 10/21/15  Yes Lorretta Harp, MD  pantoprazole (PROTONIX) 20 MG tablet Take 20 mg by mouth daily.   Yes [provider]  pravastatin (PRAVACHOL) 20 MG tablet TAKE 1 TABLET EVERY EVENING Patient taking differently: TAKE 20 MG BY MOUTH EVERY EVENING 08/04/16  Yes Lorretta Harp, MD    Propylene Glycol (SYSTANE BALANCE) 0.6 % SOLN Place 1 drop into both eyes 3 (three) times daily.   Yes [provider]    Family History Family History  Problem Relation Age of Onset  . Heart attack Mother 67  . Cancer Father        Colon  . Heart attack Maternal Grandmother 61    Social History Social History  Substance Use Topics  . Smoking status: Never Smoker  . Smokeless tobacco: Never Used  . Alcohol use No     Allergies   Altace [ramipril]; Avelox [moxifloxacin hcl in nacl]; Hctz [hydrochlorothiazide]; Lisinopril; Losartan; Statins; and Sulfa antibiotics   Review of Systems Review of Systems  Constitutional: Positive for fatigue.  Neurological: Positive for weakness.  All other systems reviewed and are negative.    Physical Exam Updated Vital Signs BP (!) 158/75   Pulse 65   Temp 97.8 F (36.6 C) (Oral)   Resp 20   Ht 5\' 2"  (1.575 m)   Wt 56.7 kg (125 lb)   SpO2 100%   BMI 22.86 kg/m   Physical Exam  Constitutional: She is oriented to person, place, and time. She appears well-developed and well-nourished.  HENT:  Head: Normocephalic and atraumatic.  Right Ear: External ear normal.  Left Ear: External ear normal.  Nose: Nose normal.  Mouth/Throat: Oropharynx is clear and moist.  Eyes: Pupils are equal, round, and reactive to light. Conjunctivae and EOM are normal.  Slight droop of the right eyelid since shingles outbreak, but no shingles lesions now.  Neck: Normal range of motion. Neck supple.  Cardiovascular: Normal rate, regular rhythm, normal heart sounds and intact distal pulses.   Pulmonary/Chest: Effort normal and breath sounds normal.  Abdominal: Soft. Bowel sounds are normal.  Musculoskeletal: Normal range of motion.  Neurological: She is alert and oriented to person, place, and time.  Skin: Skin is warm and dry.  Psychiatric: She has a normal mood and affect. Her behavior is normal. Thought content normal.  Nursing note and  vitals reviewed.    ED Treatments / Results  Labs (all labs ordered are listed, but only abnormal results are displayed) Labs Reviewed  BASIC METABOLIC PANEL - Abnormal; Notable for the following:       Result Value   Sodium 132 (*)    Glucose, Bld 172 (*)    All other components within normal limits  CBC - Abnormal; Notable for the following:    WBC 11.0 (*)    All other components within normal limits  URINALYSIS, ROUTINE  W REFLEX MICROSCOPIC - Abnormal; Notable for the following:    Color, Urine STRAW (*)    APPearance HAZY (*)    Specific Gravity, Urine 1.002 (*)    Glucose, UA 50 (*)    Leukocytes, UA LARGE (*)    Bacteria, UA RARE (*)    Squamous Epithelial / LPF 0-5 (*)    All other components within normal limits  CBG MONITORING, ED - Abnormal; Notable for the following:    Glucose-Capillary 160 (*)    All other components within normal limits  URINE CULTURE  I-STAT CG4 LACTIC ACID, ED    EKG  EKG Interpretation  Date/Time:  Sunday February 19 2017 14:46:44 EDT Ventricular Rate:  72 PR Interval:  132 QRS Duration: 78 QT Interval:  390 QTC Calculation: 427 R Axis:   58 Text Interpretation:  Normal sinus rhythm Nonspecific ST abnormality Abnormal ECG Confirmed by Isla Pence [phone]) on 02/19/2017 6:34:41 PM       Radiology No results found.  Procedures Procedures (including critical care time)  Medications Ordered in ED Medications  sodium chloride 0.9 % bolus 1,000 mL (0 mLs Intravenous Stopped 02/19/17 1822)  cefTRIAXone (ROCEPHIN) 2 g in dextrose 5 % 50 mL IVPB (0 g Intravenous Stopped 02/19/17 1752)     Initial Impression / Assessment and Plan / ED Course  I have reviewed the triage vital signs and the nursing notes.  Pertinent labs & imaging results that were available during my care of the patient were reviewed by me and considered in my medical decision making (see chart for details).     Pt is not doing well at home.  Her physical  decline has been such that she is unable to do her ADLs.  Pt d/w Dr. Denton Brick (triad) who will admit for IV abx and likely rehab placement.  Final Clinical Impressions(s) / ED Diagnoses   Final diagnoses:  Failure to thrive in adult  Acute cystitis without hematuria  Failure of outpatient treatment    New Prescriptions New Prescriptions   No medications on file     Isla Pence, MD 02/19/17 1845

## 2017-02-19 NOTE — H&P (Addendum)
History and Physical    [name] Wright RJJ:[id number] DOB: [birth year] DOA: 02/19/2017  PCP: [name], MD   Patient coming from: Home  Chief Complaint: Generalized weakness  HPI: [name] Wright is a 76 y.o. female with medical history significant for- CAD- RCA DES 2006 , HTN, HLD, DM, who presented today with complaints of generalized weakness of about 5- 6 weeks duration- which started after she was diagnosed with herpes zoster involving the right side of her eyes, nose and forehead. Patient was treated with valacyclovir which was compliant with. Patient presented again to the ED with complaints of generalized weakness 01/29/17- was diagnosed with a UTI, was placed on Keflex. Patient reports she took all the doses except the last day because she developed a yeast infection, which is subsequently treated with over-the-counter Monistat, with improvement in her symptoms. The rash and pain on her right eye has improved . Patient has had nausea over the past 6 weeks.  Patient denies frequency or dysuria, no abdominal pain.  Patient denies any difficulty breathing or cough. She has a mild headache around her right eye, intermittent over the past 6 weeks, with poor vision in her right eye, no neck stiffness, no fever or chills, no chest pain, no swelling of extremities, no diarrhea or vomiting.  Patient denies slurred speech, focal weakness of extremities, or facial asymmetry.  Over the past 6 weeks patient's son and daughter have had to move in with patient, to help with ADLs. Before illness, patient visited to the [facility]- 2-3 times a week. Patient endorses some lightheadedness on standing, tremors and weakness.  ED Course: Blood pressure was mildly elevated 140s-160s, O2 sats 99% on room air. Blood work showed sodium mildly low at 132, glucose 172, stable creatinine otherwise unremarkable, WBC mildly elevated 11. UA- large leukocytes, rare bacteria. A stat lactic acid normal at 1.8. Urine cultures  were drawn. Patient was given 1 L normal saline bolus, and started on IV ceftriaxone.  Review of Systems:  CONSTITUTIONAL- No Fever, weightloss, night sweat or change in appetite. SKIN- No Rash, colour changes or itching. HEAD- No Headache or dizziness. EARS- No vertigo, hearing loss or ear discharge. Mouth/throat- No Sorethroat, or bleeding gums. RESPIRATORY- No Cough or SOB. CARDIAC- No Palpitations, or chest pain. GI- No vomiting, diarrhoea, constipation, abd pain. URINARY- No Frequency, urgency, straining or dysuria. NEUROLOGIC- No Numbness, syncope, seizures or burning. [facility]- Denies depression or anxiety.  Past Medical History:  Diagnosis Date  . Coronary artery disease    NUCLEAR STRESS TEST, 09/30/2009 - EKG negative for ischemia  . Diabetes mellitus without complication ([place])    Treated by diet  . Family history of coronary artery disease    Mother died 56  . H/O subconjunctival hemorrhage   . Hyperlipidemia   . Hypertension   . Retroperitoneal hematoma    S/P    Past Surgical History:  Procedure Laterality Date  . CORONARY ANGIOPLASTY WITH STENT PLACEMENT  11/05/2004   RCA DES  . DILATION AND CURETTAGE OF UTERUS    . TONSILECTOMY/ADENOIDECTOMY WITH MYRINGOTOMY    . TUBAL LIGATION      reports that she has never smoked. She has never used smokeless tobacco. She reports that she does not drink alcohol or use drugs.  Allergies  Allergen Reactions  . Altace [Ramipril] Other (See Comments)    Tremors and increased tinnitus  . Avelox [Moxifloxacin Hcl In Nacl] Nausea Only and Other (See Comments)    Throat and tongue swelling  .  Hctz [Hydrochlorothiazide] Other (See Comments)    Palpitations and tremors  . Lisinopril Other (See Comments)    Headaches  . Losartan Other (See Comments)    Palpitations and tremors  . Statins Other (See Comments)    Increased blood pressure  . Sulfa Antibiotics Rash    Family History  Problem Relation Age of Onset  . Heart  attack Mother 58  . Cancer Father        Colon  . Heart attack Maternal Grandmother 61    Prior to Admission medications   Medication Sig Start Date End Date Taking? Authorizing Provider  amLODipine (NORVASC) 2.5 MG tablet Take 1 tablet (2.5 mg total) by mouth daily. Patient taking differently: Take 2.5 mg by mouth at bedtime.  06/22/16  Yes Lorretta Harp, MD  clopidogrel (PLAVIX) 75 MG tablet Take 1 tablet (75 mg total) by mouth daily. Patient taking differently: Take 75 mg by mouth 3 (three) times a week. Mon. Wed and Friday. 05/19/15  Yes Lorretta Harp, MD  COENZYME Q10 PO Take 1 capsule by mouth daily.    Yes [provider]  Difluprednate (DUREZOL) 0.05 % EMUL Place 3 drops into the right eye 3 (three) times daily.   Yes [provider]  DULoxetine (CYMBALTA) 30 MG capsule Take 30 mg by mouth daily.   Yes [provider]  famotidine (PEPCID) 20 MG tablet Take 1 tablet (20 mg total) by mouth 2 (two) times daily. Patient taking differently: Take 20 mg by mouth daily as needed.  07/28/13  Yes Kingsley Spittle, MD  loratadine (CLARITIN) 10 MG tablet Take 10 mg by mouth daily as needed for allergies.    Yes [provider]  metoprolol succinate (TOPROL-XL) 50 MG 24 hr tablet TAKE 1 TABLET BY MOUTH DAILY. TAKE WITH OR IMMEDIATELY FOLLOWING A MEAL Patient taking differently: Take 50 mg by mouth daily.  06/16/16  Yes Lorretta Harp, MD  nitroGLYCERIN (NITROSTAT) 0.4 MG SL tablet Place 1 tablet (0.4 mg total) under the tongue every 5 (five) minutes as needed for chest pain. 10/21/15  Yes Lorretta Harp, MD  pantoprazole (PROTONIX) 20 MG tablet Take 20 mg by mouth daily.   Yes [provider]  pravastatin (PRAVACHOL) 20 MG tablet TAKE 1 TABLET EVERY EVENING Patient taking differently: TAKE 20 MG BY MOUTH EVERY EVENING 08/04/16  Yes Lorretta Harp, MD  Propylene Glycol (SYSTANE BALANCE) 0.6 % SOLN Place 1 drop into both eyes 3 (three) times daily.    Yes [provider]    Physical Exam: Vitals:   02/19/17 1700 02/19/17 1715 02/19/17 1730 02/19/17 1819  BP: (!) 141/64 (!) 144/64 (!) 164/73 (!) 158/75  Pulse: 64 69 62 65  Resp: 17 19 19 20   Temp:      TempSrc:      SpO2: 100% 99% 100% 100%  Weight:      Height:        Constitutional: NAD, calm, comfortable Vitals:   02/19/17 1700 02/19/17 1715 02/19/17 1730 02/19/17 1819  BP: (!) 141/64 (!) 144/64 (!) 164/73 (!) 158/75  Pulse: 64 69 62 65  Resp: 17 19 19 20   Temp:      TempSrc:      SpO2: 100% 99% 100% 100%  Weight:      Height:       Eyes: PERRL, lids and conjunctivae normal ENMT: Mucous membranes are moist. Posterior pharynx clear of any exudate or lesions.Normal dentition.  Neck: normal, supple,  no masses, no thyromegaly Respiratory: clear to auscultation bilaterally, no wheezing, no crackles. Normal respiratory effort. No accessory muscle use.  Cardiovascular: Regular rate and rhythm, no murmurs / rubs / gallops. No extremity edema. 2+ pedal pulses. No carotid bruits.  Abdomen: no tenderness, no masses palpated. No hepatosplenomegaly. Bowel sounds positive.  Musculoskeletal: no clubbing / cyanosis. No joint deformity upper and lower extremities. Good ROM, no contractures. Normal muscle tone.  Skin: no rashes, lesions, ulcers. No induration Neurologic: CN 2-12 grossly intact. Sensation intact, DTR normal. Strength 4+ /5 in all 4. Finger to nose intact, rapidly alternating movement intact, heel-to-shin normal.  Psychiatric: Normal judgment and insight. Alert and oriented x 3. Normal mood.   Labs on Admission: I have personally reviewed following labs and imaging studies  CBC:  Recent Labs Lab 02/19/17 1455  WBC 11.0*  HGB 14.1  HCT 41.9  MCV 90.1  PLT 627   Basic Metabolic Panel:  Recent Labs Lab 02/19/17 1455  NA 132*  K 3.5  CL 101  CO2 22  GLUCOSE 172*  BUN 7  CREATININE 0.56  CALCIUM 9.4   CBG:  Recent Labs Lab 02/19/17 1727    GLUCAP 160*   Urine analysis:    Component Value Date/Time   COLORURINE STRAW (A) 02/19/2017 1455   APPEARANCEUR HAZY (A) 02/19/2017 1455   LABSPEC 1.002 (L) 02/19/2017 1455   PHURINE 8.0 02/19/2017 1455   GLUCOSEU 50 (A) 02/19/2017 1455   HGBUR NEGATIVE 02/19/2017 1455   BILIRUBINUR NEGATIVE 02/19/2017 1455   KETONESUR NEGATIVE 02/19/2017 1455   PROTEINUR NEGATIVE 02/19/2017 1455   UROBILINOGEN 0.2 07/28/2013 1153   NITRITE NEGATIVE 02/19/2017 1455   LEUKOCYTESUR LARGE (A) 02/19/2017 1455    Radiological Exams on Admission: No results found.  EKG: Q waves in inferior lateral leads. Unchanged from prior.  Assessment/Plan Principal Problem:   Failure to thrive (0-17) Active Problems:   CAD- RCA DES 2006   Hyperlipidemia   Hypertension   Diabetes mellitus without complication ([place])   Shingles right face and eye  Failure to thrive- s/p shingles-right face and eye, which appears to have mostly resolved, except for some mild headache around her right eye. No symptoms to suggest etiology of FTT at this time. No UTI symptoms but UA suggests UTI. No neck stiffness or fever, to suggest meningitis, complicating shingles infection. Orthostatic vitals negative. WBC- mildly elevated 11. EKG no change from prior. - Continue IV ceftriaxone - Follow up urine cultures drawn in ED - Will get chest x-ray PA and lateral- No acute abnormality - TSH- normal 2.3 - Troponin 1  - IV fluid normal saline +20Kcl 100 mL an hour for 12 hours, with poor recent PO intake. - PT evaluation.  UTI- WBC 11. No fever. No dysuria or frequency. Presented with weakness. Prior treatment with Keflex. -Continue IV ceftriaxone at 1g daily - Follow up urine cultures  DM- last hemoglobin A1c- 7.4 09/06/2016. CBG 160. - Not on home medications, consider starting oral hypoglycemics.  Hypertension- blood pressure elevated 035- 009F, systolic. - Continue home blood pressure medications- Norvasc 2.5 mg  daily  Shingles infection- right eyes, forehead and nose. Appears to be improving, with mild pain around right eye. Possible postherpetic neuralgia. No rash present today. Treated with valacyclovir. - Follow up outpatient  Coronary artery disease history- RCA DES 2006. Appears stable. Patient denies chest pain., or SOB. 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 - Continue home Plavix, pravastatin 20 - Continue home  metoprolol 50 mg daily.  DVT prophylaxis: Lovenox Code Status: Full Family Communication: Patient's son at bedside Disposition Plan: To be determined Consults called: None Admission status: Inpatient Medical floor   Bethena Roys MD [facility] Pager [phone]  If 7PM-7AM, please contact night-coverage [email or web address] Password [email or web address]  02/19/2017, 7:16 PM

## 2017-02-20 DIAGNOSIS — R627 Adult failure to thrive: Secondary | ICD-10-CM

## 2017-02-20 LAB — BASIC METABOLIC PANEL
ANION GAP: 7 (ref 5–15)
BUN: 7 mg/dL (ref 6–20)
CALCIUM: 8.3 mg/dL — AB (ref 8.9–10.3)
CO2: 20 mmol/L — ABNORMAL LOW (ref 22–32)
CREATININE: 0.49 mg/dL (ref 0.44–1.00)
Chloride: 109 mmol/L (ref 101–111)
GFR calc Af Amer: >60 mL/min (ref >60)
GLUCOSE: 178 mg/dL — AB (ref 65–99)
Potassium: 4.2 mmol/L (ref 3.5–5.1)
Sodium: 136 mmol/L (ref 135–145)

## 2017-02-20 LAB — CBC
HCT: 37.9 % (ref 36.0–46.0)
Hemoglobin: 12.6 g/dL (ref 12.0–15.0)
MCH: 29.9 pg (ref 26.0–34.0)
MCHC: 33.2 g/dL (ref 30.0–36.0)
MCV: 90 fL (ref 78.0–100.0)
PLATELETS: 265 10*3/uL (ref 150–400)
RBC: 4.21 MIL/uL (ref 3.87–5.11)
RDW: 13.6 % (ref 11.5–15.5)
WBC: 9.2 10*3/uL (ref 4.0–10.5)

## 2017-02-20 LAB — GLUCOSE, CAPILLARY: Glucose-Capillary: 209 mg/dL — ABNORMAL HIGH (ref 65–99)

## 2017-02-20 MED ORDER — DULOXETINE HCL 30 MG PO CPEP
30.0000 mg | ORAL_CAPSULE | Freq: Every day | ORAL | Status: DC
  Administered 2017-02-20 – 2017-02-21 (×2): 30 mg via ORAL
  Filled 2017-02-20 (×2): qty 1

## 2017-02-20 MED ORDER — DEXTROSE 5 % IV SOLN
1.0000 g | INTRAVENOUS | Status: DC
  Administered 2017-02-20: 1 g via INTRAVENOUS
  Filled 2017-02-20 (×2): qty 10

## 2017-02-20 MED ORDER — AMLODIPINE BESYLATE 2.5 MG PO TABS
2.5000 mg | ORAL_TABLET | Freq: Every day | ORAL | Status: DC
  Administered 2017-02-20: 2.5 mg via ORAL
  Filled 2017-02-20: qty 1

## 2017-02-20 MED ORDER — DIFLUPREDNATE 0.05 % OP EMUL
3.0000 [drp] | Freq: Three times a day (TID) | OPHTHALMIC | Status: DC
  Administered 2017-02-20 – 2017-02-21 (×3): 3 [drp] via OPHTHALMIC

## 2017-02-20 MED ORDER — INSULIN ASPART 100 UNIT/ML ~~LOC~~ SOLN
0.0000 [IU] | Freq: Three times a day (TID) | SUBCUTANEOUS | Status: DC
  Administered 2017-02-21: 2 [IU] via SUBCUTANEOUS
  Administered 2017-02-21: 3 [IU] via SUBCUTANEOUS

## 2017-02-20 MED ORDER — ENSURE ENLIVE PO LIQD
237.0000 mL | Freq: Two times a day (BID) | ORAL | Status: DC
  Administered 2017-02-20 – 2017-02-21 (×3): 237 mL via ORAL

## 2017-02-20 MED ORDER — CLOPIDOGREL BISULFATE 75 MG PO TABS
75.0000 mg | ORAL_TABLET | Freq: Every day | ORAL | Status: DC
  Administered 2017-02-20: 75 mg via ORAL
  Filled 2017-02-20: qty 1

## 2017-02-20 NOTE — Progress Notes (Signed)
Pt complained of heatburn in the AM, I gave her Pepcid. Pt ambulated with PT. Son and daughter were at pt's bedside. Dr. Florene Glen d/c her antibiotic.

## 2017-02-20 NOTE — Progress Notes (Signed)
   02/20/17 1000  Clinical Encounter Type  Visited With Family;Patient  Visit Type Initial  Referral From Nurse  Consult/Referral To Chaplain  Spiritual Encounters  Spiritual Needs Prayer;Emotional  Stress Factors  Patient Stress Factors Health changes  Family Stress Factors None identified    Follow up from a consult request.  Patient was in the room with the son.  She seemed relaxed and open to my visit.  Provided a space for her to share her health journey over the past few weeks and the plan for her while in the hospital.  Talked about her family and provided emotional support as she talked about what is going on, showing empathy.  Patent seemed appreciative of the visit and welcomed my offer for prayer.  Will follow up as needed.

## 2017-02-20 NOTE — Evaluation (Signed)
Physical Therapy Evaluation Patient Details Name: [name] Wright MRN: [id number] DOB: August 26, 1940 Today's Date: 02/20/2017   History of Present Illness  76 y.o. female with medical history significant for- CAD, HTN, HLD, DM, who presented with complaints of generalized weakness of about 5- 6 weeks  Clinical Impression  Pt pleasant and willing to mobilize but limited with activity tolerance, endurance and self care. Pt with good overall strength and balance but reports inability to care for herself. Pt will benefit from acute therapy to maximize mobility, function, gait and activity tolerance to return pt to independent function. Pt encouraged to have long handled sponge for bathing as well as simple prep meals ready at home for her convenience. Pt reports not being able to even open a can of soup to eat at home and needing assist for bathing. Pt able to don both shoes without assist and demonstrates decreased desire to perform self care.     Follow Up Recommendations Home health PT    Equipment Recommendations  None recommended by PT    Recommendations for Other Services       Precautions / Restrictions Precautions Precautions: Fall      Mobility  Bed Mobility Overal bed mobility: Modified Independent                Transfers Overall transfer level: Modified independent                  Ambulation/Gait Ambulation/Gait assistance: Modified independent (Device/Increase time) Ambulation Distance (Feet): 200 Feet Assistive device: None Gait Pattern/deviations: Decreased stride length;Step-through pattern   Gait velocity interpretation: Below normal speed for age/gender General Gait Details: pt with slow controlled gait and no LOB  Stairs Stairs: Yes Stairs assistance: Modified independent (Device/Increase time) Stair Management: One rail Left;Alternating pattern;Forwards Number of Stairs: 8 General stair comments: pt with steady gait and no need for cues or  assist for stairs  Wheelchair Mobility    Modified Rankin (Stroke Patients Only)       Balance Overall balance assessment: No apparent balance deficits (not formally assessed)                                           Pertinent Vitals/Pain Pain Assessment: No/denies pain    Home Living Family/patient expects to be discharged to:: Private residence Living Arrangements: Children Available Help at Discharge: Family;Available PRN/intermittently Type of Home: House Home Access: Level entry     Home Layout: Two level;Bed/bath upstairs Home Equipment: None      Prior Function Level of Independence: Needs assistance   Gait / Transfers Assistance Needed: pt normally independent   ADL's / Homemaking Assistance Needed: pt reports assist for bathing, dressing and homemaking for last 4 weeks, previously independent        Hand Dominance        Extremity/Trunk Assessment   Upper Extremity Assessment Upper Extremity Assessment: Overall WFL for tasks assessed    Lower Extremity Assessment Lower Extremity Assessment: Overall WFL for tasks assessed (bil LE 5/5 myotomes)    Cervical / Trunk Assessment Cervical / Trunk Assessment: Normal  Communication   Communication: No difficulties  Cognition Arousal/Alertness: Awake/alert Behavior During Therapy: Flat affect Overall Cognitive Status: Within Functional Limits for tasks assessed  General Comments      Exercises     Assessment/Plan    PT Assessment Patient needs continued PT services  PT Problem List Decreased mobility;Decreased activity tolerance       PT Treatment Interventions Gait training;Therapeutic exercise;Patient/family education;Functional mobility training;Therapeutic activities;DME instruction    PT Goals (Current goals can be found in the Care Plan section)  Acute Rehab PT Goals Patient Stated Goal: be able to care for  myself PT Goal Formulation: With patient Time For Goal Achievement: 03/06/17 Potential to Achieve Goals: Fair    Frequency Min 3X/week   Barriers to discharge Decreased caregiver support      Co-evaluation               AM-PAC PT "6 Clicks" Daily Activity  Outcome Measure Difficulty turning over in bed (including adjusting bedclothes, sheets and blankets)?: None Difficulty moving from lying on back to sitting on the side of the bed? : A Little Difficulty sitting down on and standing up from a chair with arms (e.g., wheelchair, bedside commode, etc,.)?: A Little Help needed moving to and from a bed to chair (including a wheelchair)?: None Help needed walking in hospital room?: None Help needed climbing 3-5 steps with a railing? : None 6 Click Score: 22    End of Session Equipment Utilized During Treatment: Gait belt Activity Tolerance: Patient tolerated treatment well Patient left: in chair;with call bell/phone within Wright Nurse Communication: Mobility status PT Visit Diagnosis: Difficulty in walking, not elsewhere classified (R26.2)    Time: 7341-9379 PT Time Calculation (min) (ACUTE ONLY): 16 min   Charges:   PT Evaluation $PT Eval Low Complexity: 1 Low     PT G CodesElwyn Wright, PT [phone]   [name] Wright [name] Wright 02/20/2017, 12:41 PM

## 2017-02-20 NOTE — Progress Notes (Signed)
PROGRESS NOTE    [name] Wright  KKX:[id number] DOB: September 21, 1940 DOA: 02/19/2017 PCP: [name], MD (Confirm with patient/family/NH records and if not entered, this HAS to be entered at [facility] point of entry. "No PCP" if truly none.)   Brief Narrative: (Start on day 1 of progress note - keep it brief and live) [name] Wright is [name] Wright 76 y.o. female with medical history significant for- CAD- RCA DES 2006 , HTN, HLD, DM, who presented today with complaints of generalized weakness of about 5- 6 weeks duration- which started after she was diagnosed with herpes zoster involving the right side of her eyes, nose and forehead.  She was treated for UTI after ED visit on 01/29/17 for generalized weakness.      Assessment & Plan:   Principal Problem:   Failure to thrive (0-17) Active Problems:   CAD- RCA DES 2006   Hyperlipidemia   Hypertension   Diabetes mellitus without complication ([place])   Shingles right face and eye   Failure to thrive- s/p shingles-right face and eye, which appears to have mostly resolved, except for some mild headache around her right eye. No symptoms to suggest etiology of FTT at this time. No UTI symptoms but UA suggests UTI (last urine culture with multiple species, suggest recollection). WBC count was mildly elevated.  EKG without change from prior.  Her son and she describe that the shingles infection wiped her out, she wasn't doing as much then and her activity decreased since then.  I suspect that this picture is deconditioning in the setting of her recent shingles infection.  She did have mildly elevated WBC count, but no fever or urinary tract infection.  I have low suspicion for UTI, but since she did have elevated WBC count in setting of her general fatigue will continue abx until cx results.  - Continue IV ceftriaxone - Follow up urine cultures drawn in ED - Will get chest x-ray PA and lateral- No acute abnormality - TSH- normal 2.3 - Troponin 1 negative - PT/OT  evaluation - nutrition  UTI- WBC 11. No fever. No dysuria or frequency. Presented with weakness. Prior treatment with Keflex. -Continue IV ceftriaxone at 1g daily - Follow up urine cultures  DM- last hemoglobin A1c- 7.4 09/06/2016. CBG 160. - SSI - Not on home medications, consider starting oral hypoglycemics.  Hypertension- blood pressure elevated 169- 678L, systolic. - Continue home blood pressure medications- Norvasc 2.5 mg daily  Shingles infection- right eyes, forehead and nose. Appears to be improving, with mild pain around right eye. Possible postherpetic neuralgia. No rash present today. Treated with valacyclovir.  She followed outpatient with ophtho.  - Follow up outpatient  Coronary artery disease history- RCA DES 2006. Appears stable. Patient denies chest pain., or SOB. 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 - Continue home Plavix, pravastatin 20 - Continue home metoprolol 50 mg daily.   DVT prophylaxis: loovenox Code Status: Full  Family Communication: son and daughter Disposition Plan: likely home with [facility] tomorrow   Consultants:   none  Procedures: (Don't include imaging studies which can be auto populated. Include things that cannot be auto populated i.e. Echo, Carotid and venous dopplers, Foley, Bipap, HD, tubes/drains, wound vac, central lines etc)  none  Antimicrobials: (specify start and planned stop date. Auto populated tables are space occupying and do not give end dates)  ceftriaxone    Subjective: Feeling ok.  Felt ok after breakfast, but then didn't feel great after  her meds.  No fevers, CP, SOB.   Objective: Vitals:   02/19/17 2000 02/19/17 2108 02/20/17 0417 02/20/17 1628  BP: (!) 152/65 (!) 156/63 (!) 157/65 (!) 147/58  Pulse: 71 74 77 77  Resp: (!) 22 20 18 16   Temp:  98.2 F (36.8 C) 98.4 F (36.9 C) 97.9 F (36.6 C)  TempSrc:  Oral Oral Oral  SpO2: 100% 100% 98% 97%  Weight:  56.7 kg (125 lb)    Height:  5\' 1"  (1.549 m)       Intake/Output Summary (Last 24 hours) at 02/20/17 2037 Last data filed at 02/20/17 2000  Gross per 24 hour  Intake          2081.67 ml  Output                0 ml  Net          2081.67 ml   Filed Weights   02/19/17 1438 02/19/17 2108  Weight: 56.7 kg (125 lb) 56.7 kg (125 lb)    Examination:  General exam: Appears calm and comfortable  Respiratory system: Clear to auscultation. Respiratory effort normal. Cardiovascular system: S1 & S2 heard, RRR. No JVD, murmurs, rubs, gallops or clicks. No pedal edema. Gastrointestinal system: Abdomen is nondistended, soft and nontender. No organomegaly or masses felt. Normal bowel sounds heard. Central nervous system: Alert and oriented. No focal neurological deficits. Extremities: Symmetric 5 x 5 power. Skin: No rashes, lesions or ulcers Psychiatry: Judgement and insight appear normal. Mood & affect appropriate.     Data Reviewed: I have personally reviewed following labs and imaging studies  CBC:  Recent Labs Lab 02/19/17 1455 02/20/17 0417  WBC 11.0* 9.2  HGB 14.1 12.6  HCT 41.9 37.9  MCV 90.1 90.0  PLT 309 211   Basic Metabolic Panel:  Recent Labs Lab 02/19/17 1455 02/20/17 0417  NA 132* 136  K 3.5 4.2  CL 101 109  CO2 22 20*  GLUCOSE 172* 178*  BUN 7 7  CREATININE 0.56 0.49  CALCIUM 9.4 8.3*   GFR: Estimated Creatinine Clearance: 44.4 mL/min (by C-G formula based on SCr of 0.49 mg/dL). Liver Function Tests:  Recent Labs Lab 02/19/17 2032  AST 14*  ALT 11*  ALKPHOS 54  BILITOT 0.7  PROT 6.2*  ALBUMIN 3.5   No results for input(s): LIPASE, AMYLASE in the last 168 hours. No results for input(s): AMMONIA in the last 168 hours. Coagulation Profile: No results for input(s): INR, PROTIME in the last 168 hours. Cardiac Enzymes:  Recent Labs Lab 02/19/17 2032  TROPONINI <0.03   BNP (last 3 results) No results for input(s): PROBNP in the last 8760 hours. HbA1C: No results for input(s): HGBA1C in  the last 72 hours. CBG:  Recent Labs Lab 02/19/17 1727  GLUCAP 160*   Lipid Profile: No results for input(s): CHOL, HDL, LDLCALC, TRIG, CHOLHDL, LDLDIRECT in the last 72 hours. Thyroid Function Tests:  Recent Labs  02/19/17 1932  TSH 2.359   Anemia Panel: No results for input(s): VITAMINB12, FOLATE, FERRITIN, TIBC, IRON, RETICCTPCT in the last 72 hours. Sepsis Labs:  Recent Labs Lab 02/19/17 1737  LATICACIDVEN 1.80    No results found for this or any previous visit (from the past 240 hour(s)).       Radiology Studies: Dg Chest 2 View  Result Date: 02/19/2017 CLINICAL DATA:  76 year old female with failure to thrive. EXAM: CHEST  2 VIEW COMPARISON:  Chest x-ray 06/27/2006. FINDINGS: Lung volumes are normal.  No consolidative airspace disease. No pleural effusions. No pneumothorax. No pulmonary nodule or mass noted. Pulmonary vasculature and the cardiomediastinal silhouette are within normal limits. Atherosclerosis in the thoracic aorta. IMPRESSION: 1.  No radiographic evidence of acute cardiopulmonary disease. 2. Aortic atherosclerosis. Electronically Signed   By: Vinnie Langton M.D.   On: 02/19/2017 20:46        Scheduled Meds: . amLODipine  2.5 mg Oral q1800  . clopidogrel  75 mg Oral q1800  . Difluprednate  3 drop Right Eye TID  . DULoxetine  30 mg Oral Q1400  . enoxaparin (LOVENOX) injection  40 mg Subcutaneous Q24H  . feeding supplement (ENSURE ENLIVE)  237 mL Oral BID BM  . metoprolol succinate  50 mg Oral Daily  . pantoprazole  20 mg Oral Daily  . polyethylene glycol  17 g Oral Daily  . polyvinyl alcohol  1 drop Both Eyes TID  . pravastatin  20 mg Oral QPM   Continuous Infusions:   LOS: 1 day    Time spent: 35 min    Fayrene Helper, MD [facility] [phone]   If 7PM-7AM, please contact night-coverage [email or web address] Password [facility] 02/20/2017, 8:37 PM

## 2017-02-20 NOTE — Care Management Note (Addendum)
Case Management Note  Patient Details  Name: [name] Wright MRN: [id number] Date of Birth: 1941/04/29  Subjective/Objective:                    Action/Plan:  Spoke to Dr Cephus Slater. Discharge planned for today. Re visited patient and her son [name] Wright explained discharge is for today . Patient would like HHPT  With [facility]. Dr Cephus Slater will enter orders and face to face. Referral made to [facility]  PT recommending home health PT. Spoke to patient and her son [name] Wright at bedside. Confirmed face sheet information. Patient states she does not need walker or bedside commode.   Her son [name] Wright and daughter [name] Wright live with her and can provide 24 hour assistance. Provided [employer] of home health agencies. Patient would like some time to decide on agency. Will follow up with patient.   Will need home health PT order and face to face. Expected Discharge Date:                  Expected Discharge Plan:  [place]  In-House Referral:     Discharge planning Services  CM Consult  Post Acute Care Choice:  Home Health Choice offered to:  Patient, Adult Children  DME Arranged:    DME Agency:     HH Arranged:  PT HH Agency:     Status of Service:  In process, will continue to follow  If discussed at Long Length of Stay Meetings, dates discussed:    Additional Comments:  [name], RN 02/20/2017, 2:35 PM

## 2017-02-20 NOTE — Progress Notes (Signed)
Initial Nutrition Assessment  DOCUMENTATION CODES:   Not applicable  INTERVENTION:   -Ensure Enlive po BID, each supplement provides 350 kcal and 20 grams of protein  NUTRITION DIAGNOSIS:   Inadequate oral intake related to poor appetite as evidenced by per patient/family report, percent weight loss.  GOAL:   Patient will meet greater than or equal to 90% of their needs  MONITOR:   PO intake, Supplement acceptance, Labs, Weight trends, Skin, I & O's  REASON FOR ASSESSMENT:   Malnutrition Screening Tool, Consult Assessment of nutrition requirement/status  ASSESSMENT:   [name] Wright is a 76 y.o. female with medical history significant for- CAD- RCA DES 2006 , HTN, HLD, DM, who presented today with complaints of generalized weakness of about 5- 6 weeks duration- which started after she was diagnosed with herpes zoster involving the right side of her eyes, nose and forehead.  Pt admitted with failure to thrive and generalized weakness.   Spoke with pt and son at bedside. Both describe that pt has experienced a generalized decline in health over the past 6 weeks, related to shingles diagnosis. Per pt son, pt is typically a very active and independent person, however, intake and activity level have greatly diminished. She was consuming 1-2 Boost supplements per day PTA. Pt's appetite has improved since she has been here, she consumed 100% of of a chicken salad sandwich and chips at lunch.   Pt reports UBW of around 135#. Noted pt has experienced a 6.7% wt loss over the past 3 weeks, which is significant for time frame.   Nutrition-Focused physical exam completed. Findings are no fat depletion, mild muscle depletion, and no edema. Pt reports her legs have always had poor muscle tone and does not notice much difference in her appearance.   Discussed importance of good meal and supplement intake to promote healing.   Labs reviewed.   Diet Order:  Diet heart healthy/carb modified  Room service appropriate? Yes; Fluid consistency: Thin Diet - low sodium heart healthy  Skin:  Reviewed, no issues  Last BM:  02/20/17  Height:   Ht Readings from Last 1 Encounters:  02/19/17 5\' 1"  (1.549 m)    Weight:   Wt Readings from Last 1 Encounters:  02/19/17 125 lb (56.7 kg)    Ideal Body Weight:  47.7 kg  BMI:  Body mass index is 23.62 kg/m.  Estimated Nutritional Needs:   Kcal:  1500-1700  Protein:  75-90 grams  Fluid:  1.5-1.7 L  EDUCATION NEEDS:   Education needs addressed  Zahrah Sutherlin A. Jimmye Norman, RD, LDN, CDE Pager: [phone] After hours Pager: [phone]

## 2017-02-21 LAB — URINE CULTURE

## 2017-02-21 LAB — GLUCOSE, CAPILLARY
GLUCOSE-CAPILLARY: 154 mg/dL — AB (ref 65–99)
Glucose-Capillary: 231 mg/dL — ABNORMAL HIGH (ref 65–99)

## 2017-02-21 MED ORDER — ENSURE ENLIVE PO LIQD: 237.0000 mL | Freq: Two times a day (BID) | ORAL | 12 refills | Status: DC

## 2017-02-21 NOTE — Progress Notes (Signed)
Pt for discharge going home, health teachings, meds instruction, next appointment, removed IV line, given all personal belongings, no complain of pain, her daughter will pick her up.

## 2017-02-21 NOTE — Progress Notes (Addendum)
Physical Therapy Treatment Patient Details Name: [name] Wright MRN: [id number] DOB: Aug 30, 1940 Today's Date: 02/21/2017    History of Present Illness Pt is a 76 y.o. female with PMH significant for CAD, HTN, HLD, and DM, who presented on 02/19/17 with c/o generalized weakness for about 5-6 weeks which started after she was diagnosed with herpes zoster involving the right side of her eyes, nose and forehead.    PT Comments    Pt progressing with mobility, able to ambulate with no AD and ascend/descend steps with 1 rail and supervision for safety. Daughter present throughout session and very supportive; pt states she lives with both children who will be able to provide assist as needed. Discussed use of SPC, but pt states she is unwilling to use AD at this point in her life. Educ on fall risk reduction and importance of continued mobility at home. Will continue to follow acutely to maximize functional mobility. Feel pt is safe to return home with HHPT and supervision from her children.   Follow Up Recommendations  Home health PT     Equipment Recommendations  None recommended by PT    Recommendations for Other Services       Precautions / Restrictions Precautions Precautions: Fall    Mobility  Bed Mobility Overal bed mobility: Modified Independent                Transfers Overall transfer level: Modified independent Equipment used: None                Ambulation/Gait Ambulation/Gait assistance: Supervision Ambulation Distance (Feet): 250 Feet Assistive device: None;1 person hand held assist Gait Pattern/deviations: Decreased stride length;Step-through pattern;Narrow base of support Gait velocity: Decreased Gait velocity interpretation: Below normal speed for age/gender General Gait Details: Amb with slow, controlled gait, with intermittent use of handrail in hallway but no LOB. Discussed use of SPC instead of feeling the need to reach for handrail, but pt  unwilling to use AD at this point in time   Stairs Stairs: Yes   Stair Management: One rail Right;Alternating pattern;Forwards Number of Stairs: 11 General stair comments: Required 1x rest break in between ascent and descent; no cues or physical assist needed  Wheelchair Mobility    Modified Rankin (Stroke Patients Only)       Balance Overall balance assessment: Needs assistance Sitting-balance support: No upper extremity supported;Feet supported Sitting balance-Leahy Scale: Good     Standing balance support: No upper extremity supported;During functional activity Standing balance-Leahy Scale: Fair                              Cognition Arousal/Alertness: Awake/alert Behavior During Therapy: WFL for tasks assessed/performed Overall Cognitive Status: Within Functional Limits for tasks assessed                                        Exercises      General Comments        Pertinent Vitals/Pain Pain Assessment: No/denies pain    Home Living                      Prior Function            PT Goals (current goals can now be found in the care plan section) Acute Rehab PT Goals Patient Stated Goal: be able to care  for myself PT Goal Formulation: With patient Time For Goal Achievement: 03/06/17 Potential to Achieve Goals: Good Progress towards PT goals: Progressing toward goals    Frequency    Min 3X/week      PT Plan Current plan remains appropriate    Co-evaluation              AM-PAC PT "6 Clicks" Daily Activity  Outcome Measure  Difficulty turning over in bed (including adjusting bedclothes, sheets and blankets)?: None Difficulty moving from lying on back to sitting on the side of the bed? : None Difficulty sitting down on and standing up from a chair with arms (e.g., wheelchair, bedside commode, etc,.)?: None Help needed moving to and from a bed to chair (including a wheelchair)?: A Little Help needed  walking in hospital room?: A Little Help needed climbing 3-5 steps with a railing? : A Little 6 Click Score: 21    End of Session Equipment Utilized During Treatment: Gait belt Activity Tolerance: Patient tolerated treatment well Patient left: in bed;with call bell/phone within reach;with family/visitor present Nurse Communication: Mobility status PT Visit Diagnosis: Difficulty in walking, not elsewhere classified (R26.2)     Time: 2446-2863 PT Time Calculation (min) (ACUTE ONLY): 12 min  Charges:  $Gait Training: 8-22 mins                    G Codes:      Mabeline Caras, PT, DPT Acute Rehab Services  Pager: [place] 02/21/2017, 1:16 PM

## 2017-02-21 NOTE — Discharge Summary (Signed)
Physician Discharge Summary  AMERI CAHOON PXT:[id number] DOB: 11-Sep-1940 DOA: 02/19/2017  PCP: [name], MD  Admit date: 02/19/2017 Discharge date: 02/21/2017  Time spent: over 30 minutes  Recommendations for Outpatient Follow-up:  1. Follow up deconditioning 2. Follow up outpatient CBC/BMP  3. She noted feeling better with insulin.  Consider starting metformin as outpatient.  She states she's been on this before, but it did cause some stomach upset.  Consider XR if this hasn't been tried before? 4. Follow up for sx of UTI, but patient asx here  Discharge Diagnoses:  Principal Problem:   Failure to thrive (0-17) Active Problems:   CAD- RCA DES 2006   Hyperlipidemia   Hypertension   Diabetes mellitus without complication ([place])   Shingles right face and eye   Discharge Condition: stable  Diet recommendation: heart healthy  Filed Weights   02/19/17 1438 02/19/17 2108  Weight: 56.7 kg (125 lb) 56.7 kg (125 lb)    History of present illness:  [name] Wright a 76 y.o.femalewith medical history significant for- CAD- RCA DES 2006 , HTN, HLD, DM, who presented today with complaints of generalized weakness of about 5- 6 weeks duration- which started after she was diagnosed with herpes zoster involving the right side of her eyes, nose and forehead.  She was treated for UTI after ED visit on 01/29/17 for generalized weakness.     She was started of ceftriaxone for a presumed UTI.  She did not have any sx of UTI and urine culture returned with multiple species.  D/c'd abx given likely asx bacteruria.  Suspect her sx are due to deconditioning related to recent shingles infection with report of not being able to do much over the past 6 weeks (especially when it first started).  Discharged with home health.    Hospital Course:  Failure to thrive-s/p shingles-right face and eye,which appears to have mostly resolved, except for some mild headache around her right eye. No symptoms to  suggest etiology of FTT at this time. No UTI symptoms but UA suggests UTI (last urine culture with multiple species, suggest recollection). WBC count was mildly elevated.  EKG without change from prior.  Her son and she describe that the shingles infection wiped her out, she wasn't doing as much then and her activity decreased since then.  I suspect that this picture is deconditioning in the setting of her recent shingles infection.  She did have mildly elevated WBC count, but no fever or urinary tract infection.  Ceftriaxone continued until cx results showed multiple species and d/c'd.  - chest x-ray PA and lateral- No acute abnormality - TSH- normal 2.3 - Troponin 1 negative - PT/OT evaluation - nutrition -> ensure enlive PO BID  Asymptomatic bacteruria-urine cx with multiple species.  No sx.  Abx d/c'd.  DM-last hemoglobin A1c- 7.4 09/06/2016. CBG 160. - SSI - Not on home medications, consider starting oral hypoglycemics.  Hypertension-blood pressure elevated 627- 035K, systolic. - Continue home blood pressure medications- Norvasc 2.5 mg daily  Shingles infection- right eyes, forehead and nose. Appears to be improving, with mild pain around right eye. Possible postherpetic neuralgia. No rash present today. Treated with valacyclovir.  She followed outpatient with ophtho.  - Follow up outpatient  Coronary artery disease history- RCA DES 2006.Appears stable. Patient denies chest pain., or SOB. 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 - Continue home Plavix, pravastatin 20 - Continue home metoprolol 50 mg daily.   Procedures:  none (  i.e. Studies not automatically included, echos, thoracentesis, etc; not x-rays)  Consultations:  none  Discharge Exam: Vitals:   02/21/17 0203 02/21/17 0646  BP: (!) 138/50 137/65  Pulse: 61 97  Resp: 19 19  Temp: 98.4 F (36.9 C) 98.3 F (36.8 C)  SpO2: 98% 97%   General: No acute distress. Cardiovascular: Heart sounds show a  regular rate, and rhythm. No gallops or rubs. No murmurs. No JVD. Lungs: Clear to auscultation bilaterally with good air movement. No rales, rhonchi or wheezes. Abdomen: Soft, nontender, nondistended with normal active bowel sounds. No masses. No hepatosplenomegaly. Neurological: Alert and oriented 3. Moves all extremities 4 with equal strength. Cranial nerves II through XII grossly intact. Skin: Warm and dry. Rash over R eye. Extremities: No clubbing or cyanosis. No edema. Pedal pulses 2+. Psychiatric: Mood and affect are normal. Insight and judgment are appropriate.  Discharge Instructions   Discharge Instructions    Diet - low sodium heart healthy    Complete by:  As directed    Increase activity slowly    Complete by:  As directed      Current Discharge Medication List    CONTINUE these medications which have NOT CHANGED   Details  amLODipine (NORVASC) 2.5 MG tablet Take 1 tablet (2.5 mg total) by mouth daily. Qty: 90 tablet, Refills: 3    clopidogrel (PLAVIX) 75 MG tablet Take 1 tablet (75 mg total) by mouth daily. Qty: 90 tablet, Refills: 1    COENZYME Q10 PO Take 1 capsule by mouth daily.     Difluprednate (DUREZOL) 0.05 % EMUL Place 3 drops into the right eye 3 (three) times daily.    DULoxetine (CYMBALTA) 30 MG capsule Take 30 mg by mouth daily.    famotidine (PEPCID) 20 MG tablet Take 1 tablet (20 mg total) by mouth 2 (two) times daily. Qty: 20 tablet, Refills: 0    loratadine (CLARITIN) 10 MG tablet Take 10 mg by mouth daily as needed for allergies.     metoprolol succinate (TOPROL-XL) 50 MG 24 hr tablet TAKE 1 TABLET BY MOUTH DAILY. TAKE WITH OR IMMEDIATELY FOLLOWING A MEAL Qty: 90 tablet, Refills: 3    nitroGLYCERIN (NITROSTAT) 0.4 MG SL tablet Place 1 tablet (0.4 mg total) under the tongue every 5 (five) minutes as needed for chest pain. Qty: 25 tablet, Refills: 3    pantoprazole (PROTONIX) 20 MG tablet Take 20 mg by mouth daily.    pravastatin  (PRAVACHOL) 20 MG tablet TAKE 1 TABLET EVERY EVENING Qty: 90 tablet, Refills: 3    Propylene Glycol (SYSTANE BALANCE) 0.6 % SOLN Place 1 drop into both eyes 3 (three) times daily.       Allergies  Allergen Reactions  . Altace [Ramipril] Other (See Comments)    Tremors and increased tinnitus  . Avelox [Moxifloxacin Hcl In Nacl] Nausea Only and Other (See Comments)    Throat and tongue swelling  . Hctz [Hydrochlorothiazide] Other (See Comments)    Palpitations and tremors  . Lisinopril Other (See Comments)    Headaches  . Losartan Other (See Comments)    Palpitations and tremors  . Statins Other (See Comments)    Increased blood pressure  . Sulfa Antibiotics Rash   Follow-up Information    [facility].   Why:  Will provide home health PT Contact information: [address] [place] [address] [phone]            The results of significant diagnostics  from this hospitalization (including imaging, microbiology, ancillary and laboratory) are listed below for reference.    Significant Diagnostic Studies: Dg Chest 2 View  Result Date: 02/19/2017 CLINICAL DATA:  76 year old female with failure to thrive. EXAM: CHEST  2 VIEW COMPARISON:  Chest x-ray 06/27/2006. FINDINGS: Lung volumes are normal. No consolidative airspace disease. No pleural effusions. No pneumothorax. No pulmonary nodule or mass noted. Pulmonary vasculature and the cardiomediastinal silhouette are within normal limits. Atherosclerosis in the thoracic aorta. IMPRESSION: 1.  No radiographic evidence of acute cardiopulmonary disease. 2. Aortic atherosclerosis. Electronically Signed   By: Vinnie Langton M.D.   On: 02/19/2017 20:46    Microbiology: Recent Results (from the past 240 hour(s))  Urine culture     Status: Abnormal   Collection Time: 02/19/17  2:55 PM  Result Value Ref Range Status   Specimen Description URINE, RANDOM  Final   Special Requests NONE  Final    Culture MULTIPLE SPECIES PRESENT, SUGGEST RECOLLECTION (A)  Final   Report Status 02/21/2017 FINAL  Final     Labs: Basic Metabolic Panel:  Recent Labs Lab 02/19/17 1455 02/20/17 0417  NA 132* 136  K 3.5 4.2  CL 101 109  CO2 22 20*  GLUCOSE 172* 178*  BUN 7 7  CREATININE 0.56 0.49  CALCIUM 9.4 8.3*   Liver Function Tests:  Recent Labs Lab 02/19/17 2032  AST 14*  ALT 11*  ALKPHOS 54  BILITOT 0.7  PROT 6.2*  ALBUMIN 3.5   No results for input(s): LIPASE, AMYLASE in the last 168 hours. No results for input(s): AMMONIA in the last 168 hours. CBC:  Recent Labs Lab 02/19/17 1455 02/20/17 0417  WBC 11.0* 9.2  HGB 14.1 12.6  HCT 41.9 37.9  MCV 90.1 90.0  PLT 309 265   Cardiac Enzymes:  Recent Labs Lab 02/19/17 2032  TROPONINI <0.03   BNP: BNP (last 3 results) No results for input(s): BNP in the last 8760 hours.  ProBNP (last 3 results) No results for input(s): PROBNP in the last 8760 hours.  CBG:  Recent Labs Lab 02/19/17 1727 02/20/17 2116 02/21/17 0741 02/21/17 1153  GLUCAP 160* 209* 231* 154*       Signed:  Fayrene Helper MD.  [facility] 02/21/2017, 1:33 PM

## 2017-02-21 NOTE — Evaluation (Signed)
Occupational Therapy Evaluation Patient Details Name: [name] Wright MRN: [id number] DOB: 07-25-1940 Today's Date: 02/21/2017    History of Present Illness Pt is a 76 y.o. female with PMH significant for CAD, HTN, HLD, and DM, who presented on 02/19/17 with c/o generalized weakness for about 5-6 weeks which started after she was diagnosed with herpes zoster involving the right side of her eyes, nose and forehead.    Clinical Impression   PTA, pt was living with her adult children and was independent prior to ~4 weeks ago, since then pt children has been assisting with ADLs and IADLs due to weakness. Pt currently performing ADLs and functional mobility at a supervision-Min Guard level. Pt reporting that she has not been able to wear her glasses because of pain in R eye; educated pt on fall prevention and compensatory techniques for low vision to optimize safety. Recommend pt dc home with initial 24 hour assist/supervision. All acute OT needs met, and pt planning to dc later today. Will sign off. Thank you.     Follow Up Recommendations  No OT follow up;Supervision/Assistance - 24 hour (Intial)    Equipment Recommendations  None recommended by OT    Recommendations for Other Services       Precautions / Restrictions Precautions Precautions: Fall Restrictions Weight Bearing Restrictions: No      Mobility Bed Mobility Overal bed mobility: Modified Independent                Transfers Overall transfer level: Needs assistance Equipment used: None Transfers: Sit to/from Stand Sit to Stand: Supervision         General transfer comment: Increased time and supervision for safety    Balance Overall balance assessment: Needs assistance Sitting-balance support: No upper extremity supported;Feet supported Sitting balance-Leahy Scale: Good     Standing balance support: No upper extremity supported;During functional activity Standing balance-Leahy Scale: Fair                              ADL either performed or assessed with clinical judgement   ADL Overall ADL's : Needs assistance/impaired Eating/Feeding: Set up;Sitting Eating/Feeding Details (indicate cue type and reason): Pt eating lunch at EOB upon arrival Grooming: Min guard;Standing   Upper Body Bathing: Set up;Sitting   Lower Body Bathing: Min guard;Sit to/from stand   Upper Body Dressing : Set up;Sitting   Lower Body Dressing: Min guard;Sit to/from stand Lower Body Dressing Details (indicate cue type and reason): Pt able to bring ankle to knee Toilet Transfer: Nature conservation officer           Functional mobility during ADLs: Min guard General ADL Comments: Pt performed ADLs and functional mobility at a supervision-Min Guard A level for safety. Pt presenting with decreased balance and would reacher out for furnature and walls to stabilize.  Provided education on fall prevention and compensatory techniques for low vision (since pt has not been able to wear her glasses). Pt verbalizing understanding.      Vision Baseline Vision/History: Wears glasses Wears Glasses: At all times Patient Visual Report: Other (comment) (Can't wear glasses because of shingles in R eye) Vision Assessment?: Vision impaired- to be further tested in functional context Additional Comments: Pt reporting blurry vision     Perception     Praxis      Pertinent Vitals/Pain Pain Assessment: No/denies pain     Hand Dominance Right   Extremity/Trunk Assessment Upper Extremity Assessment Upper Extremity  Assessment: Overall WFL for tasks assessed   Lower Extremity Assessment Lower Extremity Assessment: Overall WFL for tasks assessed   Cervical / Trunk Assessment Cervical / Trunk Assessment: Normal   Communication Communication Communication: No difficulties   Cognition Arousal/Alertness: Awake/alert Behavior During Therapy: WFL for tasks assessed/performed Overall Cognitive Status: Within  Functional Limits for tasks assessed                                     General Comments  Discussed with pt not to drive until cleared by MD    Exercises     Shoulder Instructions      Home Living Family/patient expects to be discharged to:: Private residence Living Arrangements: Children Available Help at Discharge: Family;Available PRN/intermittently Type of Home: House Home Access: Level entry     Home Layout: Two level;Bed/bath upstairs     Bathroom Shower/Tub: Occupational psychologist: Standard     Home Equipment: Shower seat - built in;Grab bars - tub/shower          Prior Functioning/Environment Level of Independence: Needs assistance  Gait / Transfers Assistance Needed: pt normally independent  ADL's / Homemaking Assistance Needed: pt reports assist for bathing, dressing and homemaking for last 4 weeks, previously independent            OT Problem List: Decreased strength;Decreased activity tolerance;Impaired balance (sitting and/or standing);Decreased safety awareness;Decreased knowledge of use of DME or AE      OT Treatment/Interventions:      OT Goals(Current goals can be found in the care plan section) Acute Rehab OT Goals Patient Stated Goal: be able to care for myself OT Goal Formulation: With patient Time For Goal Achievement: 03/07/17  OT Frequency:     Barriers to D/C:            Co-evaluation              AM-PAC PT "6 Clicks" Daily Activity     Outcome Measure Help from another person eating meals?: None Help from another person taking care of personal grooming?: None Help from another person toileting, which includes using toliet, bedpan, or urinal?: A Little Help from another person bathing (including washing, rinsing, drying)?: A Little Help from another person to put on and taking off regular upper body clothing?: None Help from another person to put on and taking off regular lower body clothing?: A  Little 6 Click Score: 21   End of Session Nurse Communication: Mobility status  Activity Tolerance: Patient tolerated treatment well Patient left: in bed;with call bell/phone within reach (at EOB)  OT Visit Diagnosis: Unsteadiness on feet (R26.81);Muscle weakness (generalized) (M62.81)                Time: 1350-1402 OT Time Calculation (min): 12 min Charges:  OT General Charges $OT Visit: 1 Visit OT Evaluation $OT Eval Low Complexity: 1 Low G-Codes:     [place] Valli MSOT, OTR/L Acute Rehab Pager: [phone] Office: [place] 02/21/2017, 2:33 PM

## 2017-02-22 ENCOUNTER — Other Ambulatory Visit: Payer: Self-pay | Admitting: Cardiovascular Disease

## 2017-02-23 DIAGNOSIS — I10 Essential (primary) hypertension: Secondary | ICD-10-CM | POA: Diagnosis not present

## 2017-02-23 DIAGNOSIS — E785 Hyperlipidemia, unspecified: Secondary | ICD-10-CM | POA: Diagnosis not present

## 2017-02-23 DIAGNOSIS — R627 Adult failure to thrive: Secondary | ICD-10-CM | POA: Diagnosis not present

## 2017-02-23 DIAGNOSIS — B0239 Other herpes zoster eye disease: Secondary | ICD-10-CM | POA: Diagnosis not present

## 2017-02-23 DIAGNOSIS — I2510 Atherosclerotic heart disease of native coronary artery without angina pectoris: Secondary | ICD-10-CM | POA: Diagnosis not present

## 2017-02-23 DIAGNOSIS — Z7902 Long term (current) use of antithrombotics/antiplatelets: Secondary | ICD-10-CM | POA: Diagnosis not present

## 2017-02-23 DIAGNOSIS — Z87440 Personal history of urinary (tract) infections: Secondary | ICD-10-CM | POA: Diagnosis not present

## 2017-02-23 DIAGNOSIS — E119 Type 2 diabetes mellitus without complications: Secondary | ICD-10-CM | POA: Diagnosis not present

## 2017-02-23 DIAGNOSIS — E1151 Type 2 diabetes mellitus with diabetic peripheral angiopathy without gangrene: Secondary | ICD-10-CM | POA: Diagnosis not present

## 2017-02-27 DIAGNOSIS — I2510 Atherosclerotic heart disease of native coronary artery without angina pectoris: Secondary | ICD-10-CM | POA: Diagnosis not present

## 2017-02-27 DIAGNOSIS — E785 Hyperlipidemia, unspecified: Secondary | ICD-10-CM | POA: Diagnosis not present

## 2017-02-27 DIAGNOSIS — R627 Adult failure to thrive: Secondary | ICD-10-CM | POA: Diagnosis not present

## 2017-02-27 DIAGNOSIS — I10 Essential (primary) hypertension: Secondary | ICD-10-CM | POA: Diagnosis not present

## 2017-02-27 DIAGNOSIS — B0239 Other herpes zoster eye disease: Secondary | ICD-10-CM | POA: Diagnosis not present

## 2017-02-27 DIAGNOSIS — E119 Type 2 diabetes mellitus without complications: Secondary | ICD-10-CM | POA: Diagnosis not present

## 2017-03-03 DIAGNOSIS — I10 Essential (primary) hypertension: Secondary | ICD-10-CM | POA: Diagnosis not present

## 2017-03-03 DIAGNOSIS — R627 Adult failure to thrive: Secondary | ICD-10-CM | POA: Diagnosis not present

## 2017-03-03 DIAGNOSIS — B0239 Other herpes zoster eye disease: Secondary | ICD-10-CM | POA: Diagnosis not present

## 2017-03-03 DIAGNOSIS — E119 Type 2 diabetes mellitus without complications: Secondary | ICD-10-CM | POA: Diagnosis not present

## 2017-03-03 DIAGNOSIS — I2510 Atherosclerotic heart disease of native coronary artery without angina pectoris: Secondary | ICD-10-CM | POA: Diagnosis not present

## 2017-03-03 DIAGNOSIS — E785 Hyperlipidemia, unspecified: Secondary | ICD-10-CM | POA: Diagnosis not present

## 2017-03-08 DIAGNOSIS — E119 Type 2 diabetes mellitus without complications: Secondary | ICD-10-CM | POA: Diagnosis not present

## 2017-03-08 DIAGNOSIS — I10 Essential (primary) hypertension: Secondary | ICD-10-CM | POA: Diagnosis not present

## 2017-03-08 DIAGNOSIS — B0239 Other herpes zoster eye disease: Secondary | ICD-10-CM | POA: Diagnosis not present

## 2017-03-08 DIAGNOSIS — E785 Hyperlipidemia, unspecified: Secondary | ICD-10-CM | POA: Diagnosis not present

## 2017-03-08 DIAGNOSIS — I2510 Atherosclerotic heart disease of native coronary artery without angina pectoris: Secondary | ICD-10-CM | POA: Diagnosis not present

## 2017-03-08 DIAGNOSIS — R627 Adult failure to thrive: Secondary | ICD-10-CM | POA: Diagnosis not present

## 2017-03-09 DIAGNOSIS — B0239 Other herpes zoster eye disease: Secondary | ICD-10-CM | POA: Diagnosis not present

## 2017-03-09 DIAGNOSIS — E119 Type 2 diabetes mellitus without complications: Secondary | ICD-10-CM | POA: Diagnosis not present

## 2017-03-09 DIAGNOSIS — E785 Hyperlipidemia, unspecified: Secondary | ICD-10-CM | POA: Diagnosis not present

## 2017-03-09 DIAGNOSIS — R627 Adult failure to thrive: Secondary | ICD-10-CM | POA: Diagnosis not present

## 2017-03-09 DIAGNOSIS — I2510 Atherosclerotic heart disease of native coronary artery without angina pectoris: Secondary | ICD-10-CM | POA: Diagnosis not present

## 2017-03-09 DIAGNOSIS — I10 Essential (primary) hypertension: Secondary | ICD-10-CM | POA: Diagnosis not present

## 2017-03-14 DIAGNOSIS — B0230 Zoster ocular disease, unspecified: Secondary | ICD-10-CM | POA: Diagnosis not present

## 2017-03-14 DIAGNOSIS — H2513 Age-related nuclear cataract, bilateral: Secondary | ICD-10-CM | POA: Diagnosis not present

## 2017-03-15 DIAGNOSIS — I2510 Atherosclerotic heart disease of native coronary artery without angina pectoris: Secondary | ICD-10-CM | POA: Diagnosis not present

## 2017-03-15 DIAGNOSIS — R627 Adult failure to thrive: Secondary | ICD-10-CM | POA: Diagnosis not present

## 2017-03-15 DIAGNOSIS — I10 Essential (primary) hypertension: Secondary | ICD-10-CM | POA: Diagnosis not present

## 2017-03-15 DIAGNOSIS — E785 Hyperlipidemia, unspecified: Secondary | ICD-10-CM | POA: Diagnosis not present

## 2017-03-15 DIAGNOSIS — B0239 Other herpes zoster eye disease: Secondary | ICD-10-CM | POA: Diagnosis not present

## 2017-03-15 DIAGNOSIS — E119 Type 2 diabetes mellitus without complications: Secondary | ICD-10-CM | POA: Diagnosis not present

## 2017-03-17 DIAGNOSIS — R627 Adult failure to thrive: Secondary | ICD-10-CM | POA: Diagnosis not present

## 2017-03-17 DIAGNOSIS — B0239 Other herpes zoster eye disease: Secondary | ICD-10-CM | POA: Diagnosis not present

## 2017-03-17 DIAGNOSIS — I10 Essential (primary) hypertension: Secondary | ICD-10-CM | POA: Diagnosis not present

## 2017-03-17 DIAGNOSIS — E785 Hyperlipidemia, unspecified: Secondary | ICD-10-CM | POA: Diagnosis not present

## 2017-03-17 DIAGNOSIS — I2510 Atherosclerotic heart disease of native coronary artery without angina pectoris: Secondary | ICD-10-CM | POA: Diagnosis not present

## 2017-03-17 DIAGNOSIS — E119 Type 2 diabetes mellitus without complications: Secondary | ICD-10-CM | POA: Diagnosis not present

## 2017-03-21 DIAGNOSIS — R627 Adult failure to thrive: Secondary | ICD-10-CM | POA: Diagnosis not present

## 2017-03-21 DIAGNOSIS — B0239 Other herpes zoster eye disease: Secondary | ICD-10-CM | POA: Diagnosis not present

## 2017-03-21 DIAGNOSIS — E119 Type 2 diabetes mellitus without complications: Secondary | ICD-10-CM | POA: Diagnosis not present

## 2017-03-21 DIAGNOSIS — E785 Hyperlipidemia, unspecified: Secondary | ICD-10-CM | POA: Diagnosis not present

## 2017-03-21 DIAGNOSIS — B0230 Zoster ocular disease, unspecified: Secondary | ICD-10-CM | POA: Diagnosis not present

## 2017-03-21 DIAGNOSIS — I10 Essential (primary) hypertension: Secondary | ICD-10-CM | POA: Diagnosis not present

## 2017-03-21 DIAGNOSIS — I2510 Atherosclerotic heart disease of native coronary artery without angina pectoris: Secondary | ICD-10-CM | POA: Diagnosis not present

## 2017-03-22 DIAGNOSIS — B029 Zoster without complications: Secondary | ICD-10-CM | POA: Diagnosis not present

## 2017-03-22 DIAGNOSIS — Z6823 Body mass index (BMI) 23.0-23.9, adult: Secondary | ICD-10-CM | POA: Diagnosis not present

## 2017-03-22 DIAGNOSIS — R627 Adult failure to thrive: Secondary | ICD-10-CM | POA: Diagnosis not present

## 2017-03-22 DIAGNOSIS — I10 Essential (primary) hypertension: Secondary | ICD-10-CM | POA: Diagnosis not present

## 2017-03-22 DIAGNOSIS — E871 Hypo-osmolality and hyponatremia: Secondary | ICD-10-CM | POA: Diagnosis not present

## 2017-03-22 DIAGNOSIS — E7849 Other hyperlipidemia: Secondary | ICD-10-CM | POA: Diagnosis not present

## 2017-03-22 DIAGNOSIS — E1151 Type 2 diabetes mellitus with diabetic peripheral angiopathy without gangrene: Secondary | ICD-10-CM | POA: Diagnosis not present

## 2017-03-24 DIAGNOSIS — I2510 Atherosclerotic heart disease of native coronary artery without angina pectoris: Secondary | ICD-10-CM | POA: Diagnosis not present

## 2017-03-24 DIAGNOSIS — R627 Adult failure to thrive: Secondary | ICD-10-CM | POA: Diagnosis not present

## 2017-03-24 DIAGNOSIS — B0239 Other herpes zoster eye disease: Secondary | ICD-10-CM | POA: Diagnosis not present

## 2017-03-24 DIAGNOSIS — E785 Hyperlipidemia, unspecified: Secondary | ICD-10-CM | POA: Diagnosis not present

## 2017-03-24 DIAGNOSIS — E119 Type 2 diabetes mellitus without complications: Secondary | ICD-10-CM | POA: Diagnosis not present

## 2017-03-24 DIAGNOSIS — I10 Essential (primary) hypertension: Secondary | ICD-10-CM | POA: Diagnosis not present

## 2017-03-27 DIAGNOSIS — E871 Hypo-osmolality and hyponatremia: Secondary | ICD-10-CM | POA: Diagnosis not present

## 2017-03-27 DIAGNOSIS — I10 Essential (primary) hypertension: Secondary | ICD-10-CM | POA: Diagnosis not present

## 2017-03-27 DIAGNOSIS — E1151 Type 2 diabetes mellitus with diabetic peripheral angiopathy without gangrene: Secondary | ICD-10-CM | POA: Diagnosis not present

## 2017-03-27 DIAGNOSIS — R634 Abnormal weight loss: Secondary | ICD-10-CM | POA: Diagnosis not present

## 2017-03-27 DIAGNOSIS — B029 Zoster without complications: Secondary | ICD-10-CM | POA: Diagnosis not present

## 2017-03-27 DIAGNOSIS — F329 Major depressive disorder, single episode, unspecified: Secondary | ICD-10-CM | POA: Diagnosis not present

## 2017-03-28 DIAGNOSIS — B0230 Zoster ocular disease, unspecified: Secondary | ICD-10-CM | POA: Diagnosis not present

## 2017-04-03 DIAGNOSIS — I10 Essential (primary) hypertension: Secondary | ICD-10-CM | POA: Diagnosis not present

## 2017-04-03 DIAGNOSIS — B0239 Other herpes zoster eye disease: Secondary | ICD-10-CM | POA: Diagnosis not present

## 2017-04-03 DIAGNOSIS — R627 Adult failure to thrive: Secondary | ICD-10-CM | POA: Diagnosis not present

## 2017-04-03 DIAGNOSIS — E785 Hyperlipidemia, unspecified: Secondary | ICD-10-CM | POA: Diagnosis not present

## 2017-04-03 DIAGNOSIS — E119 Type 2 diabetes mellitus without complications: Secondary | ICD-10-CM | POA: Diagnosis not present

## 2017-04-03 DIAGNOSIS — I2510 Atherosclerotic heart disease of native coronary artery without angina pectoris: Secondary | ICD-10-CM | POA: Diagnosis not present

## 2017-04-04 DIAGNOSIS — N814 Uterovaginal prolapse, unspecified: Secondary | ICD-10-CM | POA: Diagnosis not present

## 2017-04-06 DIAGNOSIS — B0239 Other herpes zoster eye disease: Secondary | ICD-10-CM | POA: Diagnosis not present

## 2017-04-06 DIAGNOSIS — E119 Type 2 diabetes mellitus without complications: Secondary | ICD-10-CM | POA: Diagnosis not present

## 2017-04-06 DIAGNOSIS — I10 Essential (primary) hypertension: Secondary | ICD-10-CM | POA: Diagnosis not present

## 2017-04-06 DIAGNOSIS — R627 Adult failure to thrive: Secondary | ICD-10-CM | POA: Diagnosis not present

## 2017-04-06 DIAGNOSIS — E785 Hyperlipidemia, unspecified: Secondary | ICD-10-CM | POA: Diagnosis not present

## 2017-04-06 DIAGNOSIS — I2510 Atherosclerotic heart disease of native coronary artery without angina pectoris: Secondary | ICD-10-CM | POA: Diagnosis not present

## 2017-04-17 ENCOUNTER — Other Ambulatory Visit: Payer: Self-pay | Admitting: Cardiovascular Disease

## 2017-04-25 DIAGNOSIS — B0230 Zoster ocular disease, unspecified: Secondary | ICD-10-CM | POA: Diagnosis not present

## 2017-05-11 ENCOUNTER — Other Ambulatory Visit: Payer: Self-pay | Admitting: Cardiovascular Disease

## 2017-05-15 ENCOUNTER — Telehealth: Payer: Self-pay | Admitting: Cardiovascular Disease

## 2017-05-15 NOTE — Telephone Encounter (Signed)
Returned call. Pt informed that amlodipine only affected by recall if in combination w valsartan - the form she is taking is safe to continue taking per FDA guidelines. Pt verbalized understanding and thanks.

## 2017-05-15 NOTE — Telephone Encounter (Signed)
New message  Pt c/o medication issue:  1. Name of Medication:  analodapine  2. How are you currently taking this medication (dosage and times per day)?  2.5mg  1xday  3. Are you having a reaction (difficulty breathing--STAT)?  no  4. What is your medication issue? tv says it causes cancer

## 2017-05-16 DIAGNOSIS — N814 Uterovaginal prolapse, unspecified: Secondary | ICD-10-CM | POA: Diagnosis not present

## 2017-05-25 ENCOUNTER — Other Ambulatory Visit: Payer: Self-pay | Admitting: Cardiovascular Disease

## 2017-05-26 NOTE — Telephone Encounter (Signed)
New Message       *STAT* If patient is at the pharmacy, call can be transferred to refill team.   1. Which medications need to be refilled? (please list name of each medication and dose if known)  metoprolol succinate (TOPROL-XL) 50 MG 24 hr tablet TAKE 1 TABLET BY MOUTH DAILY. TAKE WITH OR IMMEDIATELY FOLLOWING A MEAL Patient taking differently: Take 50 mg by mouth daily.      2. Which pharmacy/location (including street and city if local pharmacy) is medication to be sent to? [facility]  [address]   3. Do they need a 30 day or 90 day supply?  [place]

## 2017-06-22 ENCOUNTER — Other Ambulatory Visit: Payer: Self-pay | Admitting: Cardiovascular Disease

## 2017-06-22 MED ORDER — AMLODIPINE BESYLATE 2.5 MG PO TABS: 2.5000 mg | ORAL_TABLET | Freq: Every day | ORAL | 3 refills | Status: DC

## 2017-06-22 NOTE — Telephone Encounter (Signed)
New Message   *STAT* If patient is at the pharmacy, call can be transferred to refill team.   1. Which medications need to be refilled? (please list name of each medication and dose if known) Amlodipine 2.5mg    2. Which pharmacy/location (including street and city if local pharmacy) is medication to be sent to? [facility] [address]  3. Do they need a 30 day or 90 day supply? [name] Wright

## 2017-06-23 ENCOUNTER — Other Ambulatory Visit: Payer: Self-pay | Admitting: Cardiovascular Disease

## 2017-06-23 MED ORDER — AMLODIPINE BESYLATE 2.5 MG PO TABS: 2.5000 mg | ORAL_TABLET | Freq: Every day | ORAL | 3 refills | Status: DC

## 2017-06-23 NOTE — Telephone Encounter (Signed)
Pt says her medicine should be called to [facility] on [address].She thinks it was sent to the wrong pharmacy.Please call it in today if possible please.

## 2017-06-23 NOTE — Telephone Encounter (Signed)
Refill for Amoldipine sent to corrected pharmacy [facility] [address]

## 2017-06-27 DIAGNOSIS — E7849 Other hyperlipidemia: Secondary | ICD-10-CM | POA: Diagnosis not present

## 2017-06-27 DIAGNOSIS — E1151 Type 2 diabetes mellitus with diabetic peripheral angiopathy without gangrene: Secondary | ICD-10-CM | POA: Diagnosis not present

## 2017-06-27 DIAGNOSIS — M859 Disorder of bone density and structure, unspecified: Secondary | ICD-10-CM | POA: Diagnosis not present

## 2017-06-27 DIAGNOSIS — R82998 Other abnormal findings in urine: Secondary | ICD-10-CM | POA: Diagnosis not present

## 2017-06-27 DIAGNOSIS — I10 Essential (primary) hypertension: Secondary | ICD-10-CM | POA: Diagnosis not present

## 2017-06-27 LAB — BASIC METABOLIC PANEL
BUN: 9 (ref 4–21)
CO2: 24 — AB (ref 13–22)
Chloride: 103 (ref 99–108)
Creatinine: 0.7 (ref 0.5–1.1)
Glucose: 176
Potassium: 4.1 (ref 3.4–5.3)
Sodium: 139 (ref 137–147)

## 2017-06-27 LAB — CBC AND DIFFERENTIAL
HCT: 42 (ref 36–46)
Hemoglobin: 13.4 (ref 12.0–16.0)
Platelets: 293 (ref 150–399)
WBC: 6.7

## 2017-06-27 LAB — CBC: RBC: 4.6 (ref 3.87–5.11)

## 2017-06-27 LAB — COMPREHENSIVE METABOLIC PANEL
Albumin: 3.9 (ref 3.5–5.0)
Calcium: 9.2 (ref 8.7–10.7)
GFR calc Af Amer: 97.9
GFR calc non Af Amer: 80.9

## 2017-06-27 LAB — LIPID PANEL
Cholesterol: 191 (ref 0–200)
HDL: 48 (ref 35–70)
LDL Cholesterol: 94
Triglycerides: 246 — AB (ref 40–160)

## 2017-06-27 LAB — VITAMIN D 25 HYDROXY (VIT D DEFICIENCY, FRACTURES): Vit D, 25-Hydroxy: 41.6

## 2017-06-27 LAB — MICROALBUMIN, URINE: Microalb, Ur: 5

## 2017-06-27 LAB — HEPATIC FUNCTION PANEL
ALT: 17 (ref 7–35)
AST: 15 (ref 13–35)
Alkaline Phosphatase: 87 (ref 25–125)
Bilirubin, Total: 0.5

## 2017-06-27 LAB — HEMOGLOBIN A1C: Hemoglobin A1C: 7.3

## 2017-07-03 DIAGNOSIS — E7849 Other hyperlipidemia: Secondary | ICD-10-CM | POA: Diagnosis not present

## 2017-07-03 DIAGNOSIS — R627 Adult failure to thrive: Secondary | ICD-10-CM | POA: Diagnosis not present

## 2017-07-03 DIAGNOSIS — Z0000 Encounter for general adult medical examination without abnormal findings: Secondary | ICD-10-CM | POA: Diagnosis not present

## 2017-07-03 DIAGNOSIS — E1151 Type 2 diabetes mellitus with diabetic peripheral angiopathy without gangrene: Secondary | ICD-10-CM | POA: Diagnosis not present

## 2017-07-03 DIAGNOSIS — E871 Hypo-osmolality and hyponatremia: Secondary | ICD-10-CM | POA: Diagnosis not present

## 2017-07-03 DIAGNOSIS — I2510 Atherosclerotic heart disease of native coronary artery without angina pectoris: Secondary | ICD-10-CM | POA: Diagnosis not present

## 2017-07-03 DIAGNOSIS — I10 Essential (primary) hypertension: Secondary | ICD-10-CM | POA: Diagnosis not present

## 2017-07-03 DIAGNOSIS — Z23 Encounter for immunization: Secondary | ICD-10-CM | POA: Diagnosis not present

## 2017-07-03 DIAGNOSIS — M8580 Other specified disorders of bone density and structure, unspecified site: Secondary | ICD-10-CM | POA: Diagnosis not present

## 2017-07-03 DIAGNOSIS — Z1389 Encounter for screening for other disorder: Secondary | ICD-10-CM | POA: Diagnosis not present

## 2017-07-03 DIAGNOSIS — K219 Gastro-esophageal reflux disease without esophagitis: Secondary | ICD-10-CM | POA: Diagnosis not present

## 2017-07-03 DIAGNOSIS — Z6824 Body mass index (BMI) 24.0-24.9, adult: Secondary | ICD-10-CM | POA: Diagnosis not present

## 2017-07-18 DIAGNOSIS — Z1231 Encounter for screening mammogram for malignant neoplasm of breast: Secondary | ICD-10-CM | POA: Diagnosis not present

## 2017-07-18 DIAGNOSIS — N814 Uterovaginal prolapse, unspecified: Secondary | ICD-10-CM | POA: Diagnosis not present

## 2017-07-18 DIAGNOSIS — Z1289 Encounter for screening for malignant neoplasm of other sites: Secondary | ICD-10-CM | POA: Diagnosis not present

## 2017-07-19 DIAGNOSIS — F32A Depression, unspecified: Secondary | ICD-10-CM | POA: Insufficient documentation

## 2017-07-27 DIAGNOSIS — H18453 Nodular corneal degeneration, bilateral: Secondary | ICD-10-CM | POA: Diagnosis not present

## 2017-07-27 DIAGNOSIS — H25812 Combined forms of age-related cataract, left eye: Secondary | ICD-10-CM | POA: Diagnosis not present

## 2017-07-27 DIAGNOSIS — H25811 Combined forms of age-related cataract, right eye: Secondary | ICD-10-CM | POA: Diagnosis not present

## 2017-08-03 ENCOUNTER — Telehealth: Payer: Self-pay | Admitting: Cardiovascular Disease

## 2017-08-03 MED ORDER — CLOPIDOGREL BISULFATE 75 MG PO TABS: 75.0000 mg | ORAL_TABLET | Freq: Every day | ORAL | 1 refills | Status: DC

## 2017-08-03 NOTE — Telephone Encounter (Signed)
Rx has been sent to the pharmacy electronically. ° °

## 2017-08-03 NOTE — Telephone Encounter (Signed)
NEW MESSAGE     *STAT* If patient is at the pharmacy, call can be transferred to refill team.   1. Which medications need to be refilled? (please list name of each medication and dose if known) clopidogrel (PLAVIX) 75 MG tablet  2. Which pharmacy/location (including street and city if local pharmacy) is medication to be sent to? [place] [phone]  3. Do they need a 30 day or 90 day supply? [place]

## 2017-08-08 DIAGNOSIS — B029 Zoster without complications: Secondary | ICD-10-CM | POA: Diagnosis not present

## 2017-08-08 DIAGNOSIS — H00019 Hordeolum externum unspecified eye, unspecified eyelid: Secondary | ICD-10-CM | POA: Diagnosis not present

## 2017-08-08 DIAGNOSIS — L821 Other seborrheic keratosis: Secondary | ICD-10-CM | POA: Diagnosis not present

## 2017-08-14 ENCOUNTER — Telehealth: Payer: Self-pay | Admitting: Cardiovascular Disease

## 2017-08-14 NOTE — Telephone Encounter (Signed)
   [facility] Pre-operative Risk Assessment    Request for surgical clearance:  1. What type of surgery is being performed? CATARACT EXTRACTION BY PE,IOL-LEFT    2. When is this surgery scheduled? 08/17/17   3. What type of clearance is required (medical clearance vs. Pharmacy clearance to hold med vs. Both)? MEDICAL   4. Are there any medications that need to be held prior to surgery and how long?   5. Practice name and name of physician performing surgery? [facility] DR Julian Reil   6. What is your office phone and fax number? PHONE [phone] FAX [phone] [place]    7. Anesthesia type (None, local, MAC, general) ? LOCAL ANESTHESIA

## 2017-08-14 NOTE — Telephone Encounter (Signed)
Spoke with patient and advised no clearance received yet. Did call Freda Munro at [facility] [phone], left message to call back

## 2017-08-14 NOTE — Telephone Encounter (Signed)
New Message     [facility] wants to do cataract surgery on her , they said they have been trying to fax you and cant get through , I gave her the fax number again and told her we need the surgical clearance to come from them before it can be approved

## 2017-08-15 NOTE — Telephone Encounter (Signed)
   Cataract surgery is a low risk procedure. This patient is at low cardiac risk for this procedure and can proceed without further cardiac evaluation.   Daune Perch, AGNP-C [facility] 08/15/2017  4:16 PM

## 2017-08-17 DIAGNOSIS — H2511 Age-related nuclear cataract, right eye: Secondary | ICD-10-CM | POA: Diagnosis not present

## 2017-08-17 DIAGNOSIS — H25812 Combined forms of age-related cataract, left eye: Secondary | ICD-10-CM | POA: Diagnosis not present

## 2017-08-17 DIAGNOSIS — H2512 Age-related nuclear cataract, left eye: Secondary | ICD-10-CM | POA: Diagnosis not present

## 2017-08-24 DIAGNOSIS — H25811 Combined forms of age-related cataract, right eye: Secondary | ICD-10-CM | POA: Diagnosis not present

## 2017-08-24 DIAGNOSIS — Z01818 Encounter for other preprocedural examination: Secondary | ICD-10-CM | POA: Diagnosis not present

## 2017-08-29 DIAGNOSIS — N814 Uterovaginal prolapse, unspecified: Secondary | ICD-10-CM | POA: Diagnosis not present

## 2017-09-04 ENCOUNTER — Other Ambulatory Visit: Payer: Self-pay | Admitting: Cardiovascular Disease

## 2017-09-04 MED ORDER — METOPROLOL SUCCINATE ER 50 MG PO TB24: ORAL_TABLET | ORAL | 0 refills | Status: DC

## 2017-09-04 NOTE — Telephone Encounter (Signed)
New Message      *STAT* If patient is at the pharmacy, call can be transferred to refill team.   1. Which medications need to be refilled? (please list name of each medication and dose if known)  metoprolol succinate (TOPROL-XL) 50 MG 24 hr tablet TAKE 1 TABLET BY MOUTH DAILY WITH OR IMMEDIATELY FOLLOWING A MEAL        2. Which pharmacy/location (including street and city if local pharmacy) is medication to be sent to? [place]  3. Do they need a 30 day or 90 day supply? [place]

## 2017-09-05 ENCOUNTER — Telehealth: Payer: Self-pay | Admitting: Cardiovascular Disease

## 2017-09-05 ENCOUNTER — Other Ambulatory Visit: Payer: Self-pay

## 2017-09-05 NOTE — Telephone Encounter (Signed)
New Message     *STAT* If patient is at the pharmacy, call can be transferred to refill team.   1. Which medications need to be refilled? (please list name of each medication and dose if known) metoprolol succinate (TOPROL-XL) 50 MG 24 hr tablet  2. Which pharmacy/location (including street and city if local pharmacy) is medication to be sent to? [place]   3. Do they need a 30 day or 90 day supply? 90 day   [facility] should of went to [facility]. Please resend.

## 2017-09-07 DIAGNOSIS — H25811 Combined forms of age-related cataract, right eye: Secondary | ICD-10-CM | POA: Diagnosis not present

## 2017-09-07 DIAGNOSIS — H2511 Age-related nuclear cataract, right eye: Secondary | ICD-10-CM | POA: Diagnosis not present

## 2017-09-08 ENCOUNTER — Telehealth: Payer: Self-pay | Admitting: Cardiovascular Disease

## 2017-09-08 MED ORDER — METOPROLOL SUCCINATE ER 50 MG PO TB24: 50.0000 mg | ORAL_TABLET | Freq: Every day | ORAL | 0 refills | Status: DC

## 2017-09-08 NOTE — Telephone Encounter (Signed)
*  STAT* If patient is at the pharmacy, call can be transferred to refill team.   1. Which medications need to be refilled? (please list name of each medication and dose if known) metoprolol succinate (TOPROL-XL) 50 MG 24 hr tablet  2. Which pharmacy/location (including street and city if local pharmacy) is medication to be sent to? [place]   3. Do they need a 30 day or 90 day supply? 90 day    pt verbalized that the [facility] and that it should have went to the [facility] on [address], [place], [place] [address]  Phone: [phone]   Please resend.

## 2017-10-10 DIAGNOSIS — N814 Uterovaginal prolapse, unspecified: Secondary | ICD-10-CM | POA: Diagnosis not present

## 2017-10-11 ENCOUNTER — Encounter: Payer: Self-pay | Admitting: Cardiovascular Disease

## 2017-10-11 ENCOUNTER — Ambulatory Visit (INDEPENDENT_AMBULATORY_CARE_PROVIDER_SITE_OTHER): Payer: Medicare Other | Admitting: Cardiovascular Disease

## 2017-10-11 DIAGNOSIS — E7800 Pure hypercholesterolemia, unspecified: Secondary | ICD-10-CM

## 2017-10-11 DIAGNOSIS — I2510 Atherosclerotic heart disease of native coronary artery without angina pectoris: Secondary | ICD-10-CM

## 2017-10-11 DIAGNOSIS — I10 Essential (primary) hypertension: Secondary | ICD-10-CM

## 2017-10-11 NOTE — Assessment & Plan Note (Signed)
History of CAD status post RCA drug-eluting stenting with a Cypher drug-eluting stent by Dr. Charolette Forward May 2006 with residual disease in the LAD and circumflex coronary arteries. Her last Myoview performed 09/30/09 was nonischemic. She denies chest pain or shortness of breath.

## 2017-10-11 NOTE — Assessment & Plan Note (Signed)
History of essential hypertension with blood pressure measured 126/70. She is on amlodipine, and metoprolol. Continue current meds at current dosing.

## 2017-10-11 NOTE — Patient Instructions (Signed)
Dr Berry recommends that you schedule a follow-up appointment in 12 months. You will receive a reminder letter in the mail two months in advance. If you don't receive a letter, please call our office to schedule the follow-up appointment.  If you need a refill on your cardiac medications before your next appointment, please call your pharmacy. 

## 2017-10-11 NOTE — Progress Notes (Signed)
10/11/2017 [name] Wright   12-Apr-1941  [id number]  Primary Physician [name], MD Primary Cardiologist: Lorretta Harp MD FACP, [place], [place], [place]  HPI:  [name] Wright is a 77 y.o.  thin and frail-appearing divorced Caucasian female, mother of 33, grandmother of 5 grandchildren, who I last saw in the office 11/10/16.[name] She has a history of CAD status post RCA stenting with a Cypher drug-eluting stent by Dr. Charolette Forward in May of 2006. She did have residual disease in her LAD and circumflex coronary arteries. Her last Myoview, performed September 30, 2009, was non ischemic.[name] Her other problems include hypertension, hyperlipidemia, on Pravachol.. She's done well since that time and is asymptomatic. She apparently may have needed GYN surgery but elected not to pursue this. Since I saw her last she has had shingles in her right eye which she has since gotten over. She denies chest pain or shortness of breath.     Current Meds  Medication Sig  . amLODipine (NORVASC) 2.5 MG tablet Take 1 tablet (2.5 mg total) by mouth daily.  . clopidogrel (PLAVIX) 75 MG tablet Take 1 tablet (75 mg total) by mouth daily.  [name] COENZYME Q10 PO Take 1 capsule by mouth daily.   . Difluprednate (DUREZOL) 0.05 % EMUL Place 3 drops into the right eye 3 (three) times daily.  . DULoxetine (CYMBALTA) 30 MG capsule Take 30 mg by mouth daily.  . famotidine (PEPCID) 20 MG tablet Take 1 tablet (20 mg total) by mouth 2 (two) times daily. (Patient taking differently: Take 20 mg by mouth daily as needed. )  . feeding supplement, ENSURE ENLIVE, (ENSURE ENLIVE) LIQD Take 237 mLs by mouth 2 (two) times daily between meals.  [name] loratadine (CLARITIN) 10 MG tablet Take 10 mg by mouth daily as needed for allergies.   . metFORMIN (GLUCOPHAGE) 500 MG tablet Take 1,000 mg by mouth daily with breakfast.  . metoprolol succinate (TOPROL-XL) 50 MG 24 hr tablet Take 1 tablet (50 mg total) by mouth daily. KEEP OV.  . nitroGLYCERIN  (NITROSTAT) 0.4 MG SL tablet DISSOLVE 1 TABLET UNDER THE TONGUE EVERY 5 MINUTES AS NEEDED FOR CHEST PAIN  . pantoprazole (PROTONIX) 20 MG tablet Take 20 mg by mouth daily.  . pravastatin (PRAVACHOL) 20 MG tablet TAKE 1 TABLET EVERY EVENING (Patient taking differently: TAKE 20 MG BY MOUTH EVERY EVENING)  . Propylene Glycol (SYSTANE BALANCE) 0.6 % SOLN Place 1 drop into both eyes 3 (three) times daily.     Allergies  Allergen Reactions  . Altace [Ramipril] Other (See Comments)    Tremors and increased tinnitus  . Avelox [Moxifloxacin Hcl In Nacl] Nausea Only and Other (See Comments)    Throat and tongue swelling  . Hctz [Hydrochlorothiazide] Other (See Comments)    Palpitations and tremors  . Lisinopril Other (See Comments)    Headaches  . Losartan Other (See Comments)    Palpitations and tremors  . Statins Other (See Comments)    Increased blood pressure  . Sulfa Antibiotics Rash    Social History   Socioeconomic History  . Marital status: Divorced    Spouse name: Not on file  . Number of children: Not on file  . Years of education: Not on file  . Highest education level: Not on file  Occupational History  . Not on file  Social Needs  . Financial resource strain: Not on file  . Food insecurity:    Worry: Not on file  Inability: Not on file  . Transportation needs:    Medical: Not on file    Non-medical: Not on file  Tobacco Use  . Smoking status: Never Smoker  . Smokeless tobacco: Never Used  Substance and Sexual Activity  . Alcohol use: No    Alcohol/week: 0.0 oz  . Drug use: No  . Sexual activity: Never  Lifestyle  . Physical activity:    Days per week: Not on file    Minutes per session: Not on file  . Stress: Not on file  Relationships  . Social connections:    Talks on phone: Not on file    Gets together: Not on file    Attends religious service: Not on file    Active member of club or organization: Not on file    Attends meetings of clubs or  organizations: Not on file    Relationship status: Not on file  . Intimate partner violence:    Fear of current or ex partner: Not on file    Emotionally abused: Not on file    Physically abused: Not on file    Forced sexual activity: Not on file  Other Topics Concern  . Not on file  Social History Narrative  . Not on file     Review of Systems: General: negative for chills, fever, night sweats or weight changes.  Cardiovascular: negative for chest pain, dyspnea on exertion, edema, orthopnea, palpitations, paroxysmal nocturnal dyspnea or shortness of breath Dermatological: negative for rash Respiratory: negative for cough or wheezing Urologic: negative for hematuria Abdominal: negative for nausea, vomiting, diarrhea, bright red blood per rectum, melena, or hematemesis Neurologic: negative for visual changes, syncope, or dizziness All other systems reviewed and are otherwise negative except as noted above.    Blood pressure 126/70, pulse 81, height 5\' 2"  (1.575 m), weight 139 lb (63 kg).  General appearance: alert and no distress Neck: no adenopathy, no carotid bruit, no JVD, supple, symmetrical, trachea midline and thyroid not enlarged, symmetric, no tenderness/mass/nodules Lungs: clear to auscultation bilaterally Heart: regular rate and rhythm, S1, S2 normal, no murmur, click, rub or gallop Extremities: extremities normal, atraumatic, no cyanosis or edema Pulses: 2+ and symmetric Skin: Skin color, texture, turgor normal. No rashes or lesions Neurologic: Alert and oriented X 3, normal strength and tone. Normal symmetric reflexes. Normal coordination and gait  EKG sinus rhythm 81 without ST or T-wave changes. I personally reviewed this EKG.  ASSESSMENT AND PLAN:   CAD- RCA DES 2006 History of CAD status post RCA drug-eluting stenting with a Cypher drug-eluting stent by Dr. Charolette Forward May 2006 with residual disease in the LAD and circumflex coronary arteries. Her last Myoview  performed 09/30/09 was nonischemic. She denies chest pain or shortness of breath.  Hyperlipidemia History of hyperlipidemia on statin therapy.  Hypertension History of essential hypertension with blood pressure measured 126/70. She is on amlodipine, and metoprolol. Continue current meds at current dosing.      Lorretta Harp MD FACP,FACC,FAHA, [facility] 10/11/2017 1:44 PM

## 2017-10-11 NOTE — Assessment & Plan Note (Signed)
History of hyperlipidemia on statin therapy. 

## 2017-10-12 NOTE — Addendum Note (Signed)
Addended by: Venetia Maxon on: 10/12/2017 10:32 AM   Modules accepted: Orders

## 2017-10-17 DIAGNOSIS — D492 Neoplasm of unspecified behavior of bone, soft tissue, and skin: Secondary | ICD-10-CM | POA: Diagnosis not present

## 2017-10-26 ENCOUNTER — Other Ambulatory Visit: Payer: Self-pay | Admitting: Cardiovascular Disease

## 2017-10-30 DIAGNOSIS — D492 Neoplasm of unspecified behavior of bone, soft tissue, and skin: Secondary | ICD-10-CM | POA: Diagnosis not present

## 2017-10-30 DIAGNOSIS — C44111 Basal cell carcinoma of skin of unspecified eyelid, including canthus: Secondary | ICD-10-CM | POA: Diagnosis not present

## 2017-11-10 DIAGNOSIS — N814 Uterovaginal prolapse, unspecified: Secondary | ICD-10-CM | POA: Diagnosis not present

## 2017-11-14 ENCOUNTER — Telehealth: Payer: Self-pay | Admitting: Cardiovascular Disease

## 2017-11-14 NOTE — Telephone Encounter (Signed)
Returned call, advised this was a mistake and to disregard.   Dr. Gwenlyn Found is primary cardiologist and she had OV 10/11/17.  Apologized for the confusion.   Patient aware and verbalized understanding.

## 2017-11-14 NOTE — Telephone Encounter (Signed)
New Message   Pt states she received a recall letter about seeing Dr. Stanford Breed  And wants to know why because she sees Dr. Gwenlyn Found. Please call

## 2017-11-27 DIAGNOSIS — H0259 Other disorders affecting eyelid function: Secondary | ICD-10-CM | POA: Diagnosis not present

## 2017-11-27 DIAGNOSIS — H02729 Madarosis of unspecified eye, unspecified eyelid and periocular area: Secondary | ICD-10-CM | POA: Diagnosis not present

## 2017-11-27 DIAGNOSIS — C44111 Basal cell carcinoma of skin of unspecified eyelid, including canthus: Secondary | ICD-10-CM | POA: Diagnosis not present

## 2017-12-05 DIAGNOSIS — N814 Uterovaginal prolapse, unspecified: Secondary | ICD-10-CM | POA: Diagnosis not present

## 2017-12-14 ENCOUNTER — Other Ambulatory Visit: Payer: Self-pay | Admitting: Cardiovascular Disease

## 2017-12-27 DIAGNOSIS — M8580 Other specified disorders of bone density and structure, unspecified site: Secondary | ICD-10-CM | POA: Diagnosis not present

## 2017-12-27 DIAGNOSIS — F3289 Other specified depressive episodes: Secondary | ICD-10-CM | POA: Diagnosis not present

## 2017-12-27 DIAGNOSIS — E871 Hypo-osmolality and hyponatremia: Secondary | ICD-10-CM | POA: Diagnosis not present

## 2017-12-27 DIAGNOSIS — K219 Gastro-esophageal reflux disease without esophagitis: Secondary | ICD-10-CM | POA: Diagnosis not present

## 2017-12-27 DIAGNOSIS — R627 Adult failure to thrive: Secondary | ICD-10-CM | POA: Diagnosis not present

## 2017-12-27 DIAGNOSIS — E1151 Type 2 diabetes mellitus with diabetic peripheral angiopathy without gangrene: Secondary | ICD-10-CM | POA: Diagnosis not present

## 2017-12-27 DIAGNOSIS — I10 Essential (primary) hypertension: Secondary | ICD-10-CM | POA: Diagnosis not present

## 2017-12-27 DIAGNOSIS — I2510 Atherosclerotic heart disease of native coronary artery without angina pectoris: Secondary | ICD-10-CM | POA: Diagnosis not present

## 2017-12-27 DIAGNOSIS — Z6826 Body mass index (BMI) 26.0-26.9, adult: Secondary | ICD-10-CM | POA: Diagnosis not present

## 2017-12-27 DIAGNOSIS — E7849 Other hyperlipidemia: Secondary | ICD-10-CM | POA: Diagnosis not present

## 2018-01-12 ENCOUNTER — Telehealth: Payer: Self-pay

## 2018-01-12 NOTE — Telephone Encounter (Signed)
   [facility] Pre-operative Risk Assessment    Request for surgical clearance:  1. What type of surgery is being performed? REMOVAL/BIOPSY OF EYELID LESION W/POSS SKIN GRAFT   2. When is this surgery scheduled? 01-19-18   3. What type of clearance is required (medical clearance vs. Pharmacy clearance to hold med vs. Both)?    4. Are there any medications that need to be held prior to surgery and how long?  NOT LISTED-TAKES PLAVIX 5. Practice name and name of physician performing surgery?   [place]  6. What is your office phone number [place].   What is your office fax number [phone]  8.   Anesthesia type (None, local, MAC, general)? IV SEDATION   Waylan Rocher 01/12/2018, 3:50 PM  _________________________________________________________________   (provider comments below)

## 2018-01-15 NOTE — Telephone Encounter (Signed)
Received phone call from NP from surgeon's office. They would like the patient to hold Plavix for the procedure. Since it has been 13 years since her PCI, she can hold Plavix for 5 days and resume as soon as possible post op.  I explained this to the NP. Richardson Dopp, PA-C    01/15/2018 4:12 PM

## 2018-01-15 NOTE — Telephone Encounter (Signed)
   Primary Cardiologist: Quay Burow, MD  Chart reviewed as part of pre-operative protocol coverage. Patient was contacted 01/15/2018 in reference to pre-operative risk assessment for pending surgery as outlined below.  [name] Wright was last seen on 10/11/17 by Dr. Gwenlyn Found.  Since that day, [name] Wright has done well.  She denies chest pain, shortness of breath.  She exercises at the [facility] 3 days a week without limitation.    Therefore, based on ACC/AHA guidelines, the patient would be at acceptable risk for the planned procedure without further cardiovascular testing.   Call back staff: I faxed a letter to the requesting surgeon.  Please contact their office to make sure it was received.    I will remove from pre-op pool.  Richardson Dopp, PA-C 01/15/2018, 3:38 PM

## 2018-01-23 DIAGNOSIS — H5202 Hypermetropia, left eye: Secondary | ICD-10-CM | POA: Diagnosis not present

## 2018-01-23 DIAGNOSIS — H5211 Myopia, right eye: Secondary | ICD-10-CM | POA: Diagnosis not present

## 2018-01-23 DIAGNOSIS — E119 Type 2 diabetes mellitus without complications: Secondary | ICD-10-CM | POA: Diagnosis not present

## 2018-01-23 DIAGNOSIS — H52223 Regular astigmatism, bilateral: Secondary | ICD-10-CM | POA: Diagnosis not present

## 2018-01-23 NOTE — Telephone Encounter (Signed)
[facility]  DR Malone Fax: [phone]   01/23/2018 11:48 AM

## 2018-01-26 DIAGNOSIS — N814 Uterovaginal prolapse, unspecified: Secondary | ICD-10-CM | POA: Diagnosis not present

## 2018-01-30 DIAGNOSIS — D492 Neoplasm of unspecified behavior of bone, soft tissue, and skin: Secondary | ICD-10-CM | POA: Diagnosis not present

## 2018-01-30 DIAGNOSIS — Z01818 Encounter for other preprocedural examination: Secondary | ICD-10-CM | POA: Diagnosis not present

## 2018-02-13 ENCOUNTER — Encounter (HOSPITAL_COMMUNITY): Payer: Self-pay | Admitting: *Deleted

## 2018-02-13 ENCOUNTER — Other Ambulatory Visit: Payer: Self-pay

## 2018-02-13 NOTE — Progress Notes (Signed)
I spoke with Mrs Rashad, patient reports CBG has been running a bit high, this am fasting was 224, "I went out to eat last night."  I instructed patient to check CBG more frequently today to see if anything she is eating is causing CBG to rise. I instructed patient to hold Meformin in am. I instructed patient to check CBG after awaking and every 2 hours until arrival  to the hospital.  I Instructed patient if CBG is less than 70 to drink 1/2 cup of a clear juice. Recheck CBG in 15 minutes then call pre- op desk at [phone] for further instructions.  Dr Forde Dandy is patient's endocrinologist, she states that last A1C was 7.0. Mrs Smeltz denies chest pain or shortness of breath. Mrs Hamric reports that Dr. Kristeen Miss said she could eat breakfast, I asked at what time was she instructed to eat by, "she did not say."  I told patient that I would call the anesthesiologist and ask for instructions.  I called and spoke with Dr Marissa Nestle, who said breakfast to be finished by 7:15 AM.  I called patient  And instructed her that she could eat and drink  until 7:15 AM, non greasy food.  I had patient to teach back medications to take in am, when to stop eating and what to do if CBG is < 70.

## 2018-02-13 NOTE — H&P (Signed)
Subjective:    [name] Wright is a 77 y.o. 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 The pain is described as none. Onset was several months ago. Symptoms have been unchanged since.   Review of Systems Pertinent items are noted in HPI.    Objective:   There were no vitals taken for this visit.  General:  alert, cooperative and appears stated age Skin:  normal Eyes: positive findings: eyelids/periorbital: left lower eyelid basal cell cancer s/p incisional biopsy  Mouth: MMM no lesions Lymph Nodes:  Cervical, supraclavicular, and axillary nodes normal. Lungs:  clear to auscultation bilaterally Heart:  regular rate and rhythm, S1, S2 normal, no murmur, click, rub or gallop Abdomen: soft, non-tender; bowel sounds normal; no masses,  no organomegaly CVA:  absent Genitourinary: defer exam Extremities:  extremities normal, atraumatic, no cyanosis or edema Neurologic:  negative Psychiatric:  normal mood, behavior, speech, dress, and thought processes    Assessment: Basal Cell Cancer Left Lower Eyelid    Plan: Excisional Biopsy with Frozen Section, Full Thickness Reconstruction possible graft placement left lower eyelid   1. Discussed the risk of surgery,  and the risks of general anesthetic including MI, CVA, sudden death or even reaction to anesthetic medications. The patient understands the risks, any and all questions were answered to the patient's satisfaction. 2. Follow up: 1 week.  Date of Surgery Update (To be completed by Attending Surgeon day of surgery.)

## 2018-02-14 ENCOUNTER — Ambulatory Visit (HOSPITAL_COMMUNITY): Payer: Medicare Other | Admitting: Anesthesiology

## 2018-02-14 ENCOUNTER — Encounter (HOSPITAL_COMMUNITY)
Admission: RE | Disposition: A | Discharge status: Home / Self Care | Payer: Self-pay | Source: Ambulatory Visit | Attending: Oculoplastics Ophthalmology

## 2018-02-14 ENCOUNTER — Encounter (HOSPITAL_COMMUNITY): Payer: Self-pay

## 2018-02-14 ENCOUNTER — Ambulatory Visit (HOSPITAL_COMMUNITY)
Admission: RE | Admit: 2018-02-14 | Discharge: 2018-02-14 | Disposition: A | Discharge status: Home / Self Care | Payer: Medicare Other | Source: Ambulatory Visit | Attending: Oculoplastics Ophthalmology | Admitting: Oculoplastics Ophthalmology

## 2018-02-14 DIAGNOSIS — I10 Essential (primary) hypertension: Secondary | ICD-10-CM | POA: Insufficient documentation

## 2018-02-14 DIAGNOSIS — E785 Hyperlipidemia, unspecified: Secondary | ICD-10-CM | POA: Diagnosis not present

## 2018-02-14 DIAGNOSIS — C441192 Basal cell carcinoma of skin of left lower eyelid, including canthus: Secondary | ICD-10-CM | POA: Insufficient documentation

## 2018-02-14 DIAGNOSIS — E119 Type 2 diabetes mellitus without complications: Secondary | ICD-10-CM | POA: Diagnosis not present

## 2018-02-14 DIAGNOSIS — Z7902 Long term (current) use of antithrombotics/antiplatelets: Secondary | ICD-10-CM | POA: Diagnosis not present

## 2018-02-14 DIAGNOSIS — Z882 Allergy status to sulfonamides status: Secondary | ICD-10-CM | POA: Diagnosis not present

## 2018-02-14 DIAGNOSIS — Z79899 Other long term (current) drug therapy: Secondary | ICD-10-CM | POA: Diagnosis not present

## 2018-02-14 DIAGNOSIS — I252 Old myocardial infarction: Secondary | ICD-10-CM | POA: Diagnosis not present

## 2018-02-14 DIAGNOSIS — I2510 Atherosclerotic heart disease of native coronary artery without angina pectoris: Secondary | ICD-10-CM | POA: Insufficient documentation

## 2018-02-14 DIAGNOSIS — C44111 Basal cell carcinoma of skin of unspecified eyelid, including canthus: Secondary | ICD-10-CM | POA: Diagnosis not present

## 2018-02-14 DIAGNOSIS — Z888 Allergy status to other drugs, medicaments and biological substances status: Secondary | ICD-10-CM | POA: Insufficient documentation

## 2018-02-14 DIAGNOSIS — D492 Neoplasm of unspecified behavior of bone, soft tissue, and skin: Secondary | ICD-10-CM | POA: Diagnosis not present

## 2018-02-14 HISTORY — DX: Essential tremor: G25.0

## 2018-02-14 HISTORY — PX: RECONSTRUCTION OF EYELID: SHX6576

## 2018-02-14 HISTORY — DX: Malignant (primary) neoplasm, unspecified: C80.1

## 2018-02-14 HISTORY — PX: SKIN FULL THICKNESS GRAFT: SHX442

## 2018-02-14 HISTORY — DX: Zoster without complications: B02.9

## 2018-02-14 HISTORY — PX: LID LESION EXCISION: SHX5204

## 2018-02-14 HISTORY — DX: Acute myocardial infarction, unspecified: I21.9

## 2018-02-14 HISTORY — DX: Personal history of other medical treatment: Z92.89

## 2018-02-14 LAB — BASIC METABOLIC PANEL
Anion gap: 14 (ref 5–15)
BUN: 6 mg/dL — AB (ref 8–23)
CHLORIDE: 101 mmol/L (ref 98–111)
CO2: 23 mmol/L (ref 22–32)
CREATININE: 0.56 mg/dL (ref 0.44–1.00)
Calcium: 9.3 mg/dL (ref 8.9–10.3)
GFR calc Af Amer: >60 mL/min (ref >60)
GFR calc non Af Amer: >60 mL/min (ref >60)
GLUCOSE: 182 mg/dL — AB (ref 70–99)
Potassium: 3.8 mmol/L (ref 3.5–5.1)
SODIUM: 138 mmol/L (ref 135–145)

## 2018-02-14 LAB — PROTIME-INR
INR: 0.92
Prothrombin Time: 12.2 seconds (ref 11.4–15.2)

## 2018-02-14 LAB — GLUCOSE, CAPILLARY
Glucose-Capillary: 184 mg/dL — ABNORMAL HIGH (ref 70–99)
Glucose-Capillary: 165 mg/dL — ABNORMAL HIGH (ref 70–99)

## 2018-02-14 LAB — CBC
HEMATOCRIT: 42.1 % (ref 36.0–46.0)
HEMOGLOBIN: 13.8 g/dL (ref 12.0–15.0)
MCH: 29.7 pg (ref 26.0–34.0)
MCHC: 32.8 g/dL (ref 30.0–36.0)
MCV: 90.5 fL (ref 78.0–100.0)
Platelets: 305 10*3/uL (ref 150–400)
RBC: 4.65 MIL/uL (ref 3.87–5.11)
RDW: 12.5 % (ref 11.5–15.5)
WBC: 10.2 10*3/uL (ref 4.0–10.5)

## 2018-02-14 LAB — HEMOGLOBIN A1C
Hgb A1c MFr Bld: 8.7 % — ABNORMAL HIGH (ref 4.8–5.6)
Mean Plasma Glucose: 202.99 mg/dL

## 2018-02-14 SURGERY — EXCISION, LESION, EYELID
Anesthesia: General | Site: Eye | Laterality: Left

## 2018-02-14 MED ORDER — PHENYLEPHRINE 40 MCG/ML (10ML) SYRINGE FOR IV PUSH (FOR BLOOD PRESSURE SUPPORT)
PREFILLED_SYRINGE | INTRAVENOUS | Status: AC
  Filled 2018-02-14: qty 10

## 2018-02-14 MED ORDER — SODIUM CHLORIDE 0.9 % IV SOLN
INTRAVENOUS | Status: DC | PRN
  Administered 2018-02-14: 20 ug/min via INTRAVENOUS

## 2018-02-14 MED ORDER — BUPIVACAINE HCL (PF) 0.5 % IJ SOLN
INTRAMUSCULAR | Status: AC
  Filled 2018-02-14: qty 10

## 2018-02-14 MED ORDER — PROPOFOL 10 MG/ML IV BOLUS
INTRAVENOUS | Status: AC
  Filled 2018-02-14: qty 20

## 2018-02-14 MED ORDER — LIDOCAINE 2% (20 MG/ML) 5 ML SYRINGE
INTRAMUSCULAR | Status: AC
  Filled 2018-02-14: qty 10

## 2018-02-14 MED ORDER — DEXAMETHASONE SODIUM PHOSPHATE 10 MG/ML IJ SOLN
INTRAMUSCULAR | Status: DC | PRN
  Administered 2018-02-14: 10 mg via INTRAVENOUS

## 2018-02-14 MED ORDER — FENTANYL CITRATE (PF) 100 MCG/2ML IJ SOLN
INTRAMUSCULAR | Status: DC | PRN
  Administered 2018-02-14: 50 ug via INTRAVENOUS

## 2018-02-14 MED ORDER — SUCCINYLCHOLINE CHLORIDE 200 MG/10ML IV SOSY
PREFILLED_SYRINGE | INTRAVENOUS | Status: AC
  Filled 2018-02-14: qty 10

## 2018-02-14 MED ORDER — LACTATED RINGERS IV SOLN
INTRAVENOUS | Status: DC
  Administered 2018-02-14: 13:00:00 via INTRAVENOUS

## 2018-02-14 MED ORDER — ONDANSETRON HCL 4 MG/2ML IJ SOLN
INTRAMUSCULAR | Status: AC
  Filled 2018-02-14: qty 6

## 2018-02-14 MED ORDER — BUPIVACAINE HCL (PF) 0.75 % IJ SOLN
INTRAMUSCULAR | Status: AC
  Filled 2018-02-14: qty 10

## 2018-02-14 MED ORDER — TOBRAMYCIN-DEXAMETHASONE 0.3-0.1 % OP OINT
TOPICAL_OINTMENT | OPHTHALMIC | Status: DC | PRN
  Administered 2018-02-14: 1 via OPHTHALMIC

## 2018-02-14 MED ORDER — HYDROCODONE-ACETAMINOPHEN 5-325 MG PO TABS: 1.0000 | ORAL_TABLET | Freq: Four times a day (QID) | ORAL | 0 refills | Status: AC | PRN

## 2018-02-14 MED ORDER — LIDOCAINE-EPINEPHRINE 1 %-1:100000 IJ SOLN
INTRAMUSCULAR | Status: DC | PRN
  Administered 2018-02-14: 1 mL via INTRAMUSCULAR

## 2018-02-14 MED ORDER — TOBRAMYCIN-DEXAMETHASONE 0.3-0.1 % OP OINT
TOPICAL_OINTMENT | OPHTHALMIC | Status: AC
  Filled 2018-02-14: qty 3.5

## 2018-02-14 MED ORDER — PROPOFOL 10 MG/ML IV BOLUS
INTRAVENOUS | Status: DC | PRN
  Administered 2018-02-14: 190 mg via INTRAVENOUS

## 2018-02-14 MED ORDER — FENTANYL CITRATE (PF) 250 MCG/5ML IJ SOLN
INTRAMUSCULAR | Status: AC
  Filled 2018-02-14: qty 5

## 2018-02-14 MED ORDER — PHENYLEPHRINE 40 MCG/ML (10ML) SYRINGE FOR IV PUSH (FOR BLOOD PRESSURE SUPPORT)
PREFILLED_SYRINGE | INTRAVENOUS | Status: DC | PRN
  Administered 2018-02-14: 80 ug via INTRAVENOUS
  Administered 2018-02-14: 40 ug via INTRAVENOUS

## 2018-02-14 MED ORDER — LIDOCAINE 2% (20 MG/ML) 5 ML SYRINGE
INTRAMUSCULAR | Status: AC
  Filled 2018-02-14: qty 5

## 2018-02-14 MED ORDER — LIDOCAINE-EPINEPHRINE 1 %-1:100000 IJ SOLN
INTRAMUSCULAR | Status: AC
  Filled 2018-02-14: qty 1

## 2018-02-14 MED ORDER — BSS IO SOLN
INTRAOCULAR | Status: AC
  Filled 2018-02-14: qty 15

## 2018-02-14 MED ORDER — BSS IO SOLN
INTRAOCULAR | Status: DC | PRN
  Administered 2018-02-14: 15 mL via INTRAOCULAR

## 2018-02-14 MED ORDER — EPHEDRINE 5 MG/ML INJ
INTRAVENOUS | Status: AC
  Filled 2018-02-14: qty 10

## 2018-02-14 MED ORDER — ONDANSETRON HCL 4 MG/2ML IJ SOLN
INTRAMUSCULAR | Status: DC | PRN
  Administered 2018-02-14: 4 mg via INTRAVENOUS

## 2018-02-14 MED ORDER — LIDOCAINE HCL (CARDIAC) PF 100 MG/5ML IV SOSY
PREFILLED_SYRINGE | INTRAVENOUS | Status: DC | PRN
  Administered 2018-02-14: 100 mg via INTRAVENOUS

## 2018-02-14 MED ORDER — PHENYLEPHRINE HCL 10 MG/ML IJ SOLN
INTRAMUSCULAR | Status: AC
  Filled 2018-02-14: qty 1

## 2018-02-14 MED ORDER — DEXAMETHASONE SODIUM PHOSPHATE 10 MG/ML IJ SOLN
INTRAMUSCULAR | Status: AC
  Filled 2018-02-14: qty 1

## 2018-02-14 MED ORDER — ROCURONIUM BROMIDE 50 MG/5ML IV SOSY
PREFILLED_SYRINGE | INTRAVENOUS | Status: AC
  Filled 2018-02-14: qty 10

## 2018-02-14 MED ORDER — ONDANSETRON HCL 4 MG/2ML IJ SOLN
INTRAMUSCULAR | Status: AC
  Filled 2018-02-14: qty 2

## 2018-02-14 SURGICAL SUPPLY — 40 items
APPLICATOR COTTON TIP 6 STRL (MISCELLANEOUS) ×1 IMPLANT
APPLICATOR COTTON TIP 6IN STRL (MISCELLANEOUS) ×3
APPLICATOR DR MATTHEWS STRL (MISCELLANEOUS) ×3 IMPLANT
BANDAGE EYE OVAL (MISCELLANEOUS) ×3 IMPLANT
BLADE SURG 15 STRL LF DISP TIS (BLADE) ×1 IMPLANT
BLADE SURG 15 STRL SS (BLADE) ×2
CLOSURE STERI-STRIP 1/2X4 (GAUZE/BANDAGES/DRESSINGS) ×1
CLSR STERI-STRIP ANTIMIC 1/2X4 (GAUZE/BANDAGES/DRESSINGS) ×2 IMPLANT
CORD BIPOLAR FORCEPS 12FT (ELECTRODE) IMPLANT
COVER LIGHT HANDLE STERIS (MISCELLANEOUS) ×3 IMPLANT
COVER SURGICAL LIGHT HANDLE (MISCELLANEOUS) ×3 IMPLANT
DRAPE ORTHO SPLIT 87X125 STRL (DRAPES) ×3 IMPLANT
DRESSING TELFA 8X10 (GAUZE/BANDAGES/DRESSINGS) IMPLANT
ELECT NEEDLE BLADE 2-5/6 (NEEDLE) ×3 IMPLANT
ELECT REM PT RETURN 9FT ADLT (ELECTROSURGICAL) ×3
ELECTRODE REM PT RTRN 9FT ADLT (ELECTROSURGICAL) ×1 IMPLANT
FORCEPS BIPOLAR SPETZLER 8 1.0 (NEUROSURGERY SUPPLIES) IMPLANT
FRAME EYE SHIELD (PROTECTIVE WEAR) IMPLANT
GLOVE BIO SURGEON STRL SZ7.5 (GLOVE) ×6 IMPLANT
GOWN STRL REUS W/ TWL LRG LVL3 (GOWN DISPOSABLE) ×2 IMPLANT
GOWN STRL REUS W/TWL LRG LVL3 (GOWN DISPOSABLE) ×4
KIT BASIN OR (CUSTOM PROCEDURE TRAY) ×3 IMPLANT
NEEDLE PRECISIONGLIDE 27X1.5 (NEEDLE) ×3 IMPLANT
NS IRRIG 1000ML POUR BTL (IV SOLUTION) ×3 IMPLANT
PACK CATARACT CUSTOM (CUSTOM PROCEDURE TRAY) ×3 IMPLANT
PAD ARMBOARD 7.5X6 YLW CONV (MISCELLANEOUS) ×6 IMPLANT
PENCIL BUTTON HOLSTER BLD 10FT (ELECTRODE) ×3 IMPLANT
SUT CHROMIC 5 0 P 3 (SUTURE) IMPLANT
SUT ETHILON 6 0 P 1 (SUTURE) ×3 IMPLANT
SUT ETHILON 7 0 P 6 18 (SUTURE) IMPLANT
SUT MERSILENE 5 0 RD 1 DA (SUTURE) IMPLANT
SUT PLAIN 5 0 P 3 18 (SUTURE) IMPLANT
SUT PLAIN 6 0 TG1408 (SUTURE) IMPLANT
SUT PLAIN GUT FAST 5-0 (SUTURE) ×3 IMPLANT
SUT SILK 4 0 P 3 (SUTURE) ×3 IMPLANT
SUT VIC AB 5-0 P-3 18XBRD (SUTURE) ×1 IMPLANT
SUT VIC AB 5-0 P3 18 (SUTURE) ×2
SUT VICRYL 6-0 S14 (SUTURE) IMPLANT
TOWEL NATURAL 6PK STERILE (DISPOSABLE) ×3 IMPLANT
WATER STERILE IRR 1000ML POUR (IV SOLUTION) ×3 IMPLANT

## 2018-02-14 NOTE — Anesthesia Preprocedure Evaluation (Signed)
Anesthesia Evaluation  Patient identified by MRN, date of birth, ID band Patient awake    Reviewed: Allergy & Precautions, NPO status , Patient's Chart, lab work & pertinent test results  Airway Mallampati: II  TM Distance: >3 FB Neck ROM: Full    Dental no notable dental hx. (+) Teeth Intact, Dental Advisory Given   Pulmonary neg pulmonary ROS,    Pulmonary exam normal breath sounds clear to auscultation       Cardiovascular hypertension, + CAD and + Past MI  Normal cardiovascular exam Rhythm:Regular Rate:Normal     Neuro/Psych negative neurological ROS  negative psych ROS   GI/Hepatic   Endo/Other  diabetes  Renal/GU      Musculoskeletal   Abdominal   Peds  Hematology   Anesthesia Other Findings   Reproductive/Obstetrics                             Anesthesia Physical Anesthesia Plan  ASA: III  Anesthesia Plan: General   Post-op Pain Management:    Induction: Intravenous  PONV Risk Score and Plan: 3 and Treatment may vary due to age or medical condition, Dexamethasone and Ondansetron  Airway Management Planned: LMA  Additional Equipment:   Intra-op Plan:   Post-operative Plan:   Informed Consent: I have reviewed the patients History and Physical, chart, labs and discussed the procedure including the risks, benefits and alternatives for the proposed anesthesia with the patient or authorized representative who has indicated his/her understanding and acceptance.   Dental advisory given  Plan Discussed with: CRNA  Anesthesia Plan Comments:         Anesthesia Quick Evaluation

## 2018-02-14 NOTE — Interval H&P Note (Signed)
History and Physical Interval Note:  02/14/2018 3:02 PM  [name] Wright  has presented today for surgery, with the diagnosis of basal cell left lower lid  The various methods of treatment have been discussed with the patient and family. After consideration of risks, benefits and other options for treatment, the patient has consented to  Procedure(s): LEFT LID LESION EXCISION (Left) SKIN GRAFT FULL THICKNESS LEFT EYE (Left) TOTAL RECONSTRUCTION OF EYELID (Left) as a surgical intervention .  The patient's history has been reviewed, patient examined, no change in status, stable for surgery.  I have reviewed the patient's chart and labs.  Questions were answered to the patient's satisfaction.     Clista Bernhardt

## 2018-02-14 NOTE — Op Note (Signed)
Procedure(s): LEFT LID LESION EXCISION SKIN GRAFT FULL THICKNESS LEFT EYE TOTAL RECONSTRUCTION OF EYELID Procedure Note  [name] Wright female 77 y.o. 02/14/2018  Procedure(s) and Anesthesia Type:    * LEFT LID LESION EXCISION - General    * TOTAL RECONSTRUCTION OF EYELID - General  Surgeon(s) and Role:    * Clista Bernhardt, MD - Primary   Indications: The patient is aware that they have a basal cell cancer, necessitating excision. The patient is aware of the risks and benefits of surgery including bleeding, infection, scarring, need for re-excision, asymmetry, and loss of the eye, and loss of life. The patient elects to proceed.       Surgeon: Clista Bernhardt   Assistants: none   Anesthesia: General endotracheal anesthesia and Local anesthesia 1% plain lidocaine, 0.5% bupivacaine  ASA Class: 3  Procedure Detail  LEFT LID LESION EXCISION with Frozen Section TOTAL RECONSTRUCTION OF EYELID The patient is aware that they have a basal cell cancer, necessitating excision. The patient is aware of the risks and benefits of surgery including bleeding, infection, scarring, need for re-excision, asymmetry, and loss of the eye, and loss of life. The patient elects to proceed.  The patient was transferred to the OR in supine position where they were prepped and draped in the usual standard sterile fashion for Oculoplastic surgery at . Attention was turned to the left lower  eyelid which was then infiltrated with local anesthesia. The area of visible basal cell was incised with a 15 blade and then excised with a [place]otts scissors, hemostasis was maintained with monopolar cautery.   The area was then closed from meibomian gland orifice to meibomian gland orifice and then from gray line to gray line with a 5-0 undyed vicryl. Then 2 buried mattress sutures were placed to bring the incized edges together. Then the skin was closed in an interrupted fashion with 5-0 plain gut.    Findings: none  Estimated Blood Loss:  Minimal         Drains: none         Total IV Fluids: <2L   Blood Given: none          Specimens: left lower eyelid 5x24mm         Implants: none        Complications:  * No complications entered in OR log *         Disposition: PACU - hemodynamically stable.         Condition: stable

## 2018-02-14 NOTE — Transfer of Care (Signed)
Immediate Anesthesia Transfer of Care Note  Patient: [name] Wright  Procedure(s) Performed: LEFT LID LESION EXCISION (Left Eye) SKIN GRAFT FULL THICKNESS LEFT EYE (Left Eye) TOTAL RECONSTRUCTION OF EYELID (Left Eye)  Patient Location: PACU  Anesthesia Type:General  Level of Consciousness: awake, alert  and oriented  Airway & Oxygen Therapy: Patient Spontanous Breathing and Patient connected to face mask oxygen  Post-op Assessment: Report given to RN and Post -op Vital signs reviewed and stable  Post vital signs: Reviewed and stable  Last Vitals:  Vitals Value Taken Time  BP 147/68 02/14/2018  4:23 PM  Temp    Pulse 74 02/14/2018  4:26 PM  Resp 15 02/14/2018  4:26 PM  SpO2 100 % 02/14/2018  4:26 PM  Vitals shown include unvalidated device data.  Last Pain:  Vitals:   02/14/18 1320  TempSrc:   PainSc: 0-No pain      Patients Stated Pain Goal: 3 (01/75/10 2585)  Complications: No apparent anesthesia complications

## 2018-02-14 NOTE — Brief Op Note (Addendum)
02/14/2018  3:02 PM  PATIENT:  [name] Wright  77 y.o. female  PRE-OPERATIVE DIAGNOSIS:  basal cell left lower lid  POST-OPERATIVE DIAGNOSIS:  * No post-op diagnosis entered *  PROCEDURE:  Procedure(s): LEFT LID LESION EXCISION (Left) TOTAL RECONSTRUCTION OF EYELID (Left)  SURGEON:  Surgeon(s) and Role:    * Abugo, Peyton Najjar, MD - Primary  PHYSICIAN ASSISTANT: none   ASSISTANTS: none   ANESTHESIA:   local and general  EBL:  Minimal  BLOOD ADMINISTERED:none  DRAINS: none   LOCAL MEDICATIONS USED:  BUPIVICAINE  and LIDOCAINE   SPECIMEN:  Source of Specimen:  left lower eyelid   DISPOSITION OF SPECIMEN:  PATHOLOGY  COUNTS:  YES  TOURNIQUET:  * No tourniquets in log *  DICTATION: .Note written in EPIC  PLAN OF CARE: Discharge to home after PACU  PATIENT DISPOSITION:  PACU - hemodynamically stable.   Delay start of Pharmacological VTE agent (>24hrs) due to surgical blood loss or risk of bleeding: no

## 2018-02-14 NOTE — Discharge Instructions (Addendum)
Please keep patch on for 24hrs. Do not get patch wet. Patch is OK to remove tomorrow morning.  You will have a medicated steri-strip covering your sutures. Please keep this intact until your post-operative appointment. It is OK to shower with this strip on- avoid rubbing the area.   Post Anesthesia Home Care Instructions  Activity: Get plenty of rest for the remainder of the day. A responsible individual must stay with you for 24 hours following the procedure.  For the next 24 hours, DO NOT: -Drive a car -Paediatric nurse -Drink alcoholic beverages -Take any medication unless instructed by your physician -Make any legal decisions or sign important papers.  Meals: Start with liquid foods such as gelatin or soup. Progress to regular foods as tolerated. Avoid greasy, spicy, heavy foods. If nausea and/or vomiting occur, drink only clear liquids until the nausea and/or vomiting subsides. Call your physician if vomiting continues.  Special Instructions/Symptoms: Your throat may feel dry or sore from the anesthesia or the breathing tube placed in your throat during surgery. If this causes discomfort, gargle with warm salt water. The discomfort should disappear within 24 hours.  If you had a scopolamine patch placed behind your ear for the management of post- operative nausea and/or vomiting:  1. The medication in the patch is effective for 72 hours, after which it should be removed.  Wrap patch in a tissue and discard in the trash. Wash hands thoroughly with soap and water. 2. You may remove the patch earlier than 72 hours if you experience unpleasant side effects which may include dry mouth, dizziness or visual disturbances. 3. Avoid touching the patch. Wash your hands with soap and water after contact with the patch.

## 2018-02-14 NOTE — Anesthesia Procedure Notes (Signed)
Procedure Name: LMA Insertion Date/Time: 02/14/2018 3:15 PM Performed by: Raenette Rover, CRNA Pre-anesthesia Checklist: Patient identified, Emergency Drugs available, Suction available and Patient being monitored Patient Re-evaluated:Patient Re-evaluated prior to induction Oxygen Delivery Method: Circle system utilized Preoxygenation: Pre-oxygenation with 100% oxygen Induction Type: IV induction LMA: LMA inserted LMA Size: 4.0 Number of attempts: 1 Placement Confirmation: positive ETCO2,  CO2 detector and breath sounds checked- equal and bilateral Tube secured with: Tape Dental Injury: Teeth and Oropharynx as per pre-operative assessment

## 2018-02-15 ENCOUNTER — Encounter (HOSPITAL_COMMUNITY): Payer: Self-pay | Admitting: Oculoplastics Ophthalmology

## 2018-02-15 NOTE — Anesthesia Postprocedure Evaluation (Signed)
Anesthesia Post Note  Patient: [name] Wright  Procedure(s) Performed: LEFT LID LESION EXCISION (Left Eye) SKIN GRAFT FULL THICKNESS LEFT EYE (Left Eye) TOTAL RECONSTRUCTION OF EYELID (Left Eye)     Patient location during evaluation: PACU Anesthesia Type: General Level of consciousness: awake and alert Pain management: pain level controlled Vital Signs Assessment: post-procedure vital signs reviewed and stable Respiratory status: spontaneous breathing, nonlabored ventilation, respiratory function stable and patient connected to nasal cannula oxygen Cardiovascular status: blood pressure returned to baseline and stable Postop Assessment: no apparent nausea or vomiting Anesthetic complications: no    Last Vitals:  Vitals:   02/14/18 1639 02/14/18 1652  BP: (!) 149/72 (!) 161/76  Pulse: 83 78  Resp: 17 12  Temp:    SpO2: 94% 96%    Last Pain:  Vitals:   02/14/18 1652  TempSrc:   PainSc: 0-No pain   Pain Goal: Patients Stated Pain Goal: 3 (02/14/18 1320)               Barnet Glasgow

## 2018-02-26 ENCOUNTER — Telehealth: Payer: Self-pay | Admitting: Cardiology

## 2018-02-26 NOTE — Telephone Encounter (Signed)
Returned call to patient she stated she cannot take metoprolol,causing her to have no energy,roaring in both ears,light headed.Advised I will send message to pharmacy for advice.

## 2018-02-26 NOTE — Telephone Encounter (Signed)
Pt c/o medication issue:  1. Name of Medication: Metoprolol   2. How are you currently taking this medication (dosage and times per day)? 50 mg once a day  3. Are you having a reaction (difficulty breathing--STAT)? no  4. What is your medication issue? A little  Light headed

## 2018-02-26 NOTE — Telephone Encounter (Signed)
Returned call to patient no answer.LMTC. 

## 2018-02-26 NOTE — Telephone Encounter (Signed)
Recommend to decrease dose instead of stopping medication all together. Okay to take 1/2 tablet daily.   Please schedule to next available to medication titration or APP for complete assessment.

## 2018-02-27 ENCOUNTER — Telehealth: Payer: Self-pay | Admitting: Cardiovascular Disease

## 2018-02-27 NOTE — Telephone Encounter (Signed)
See previous encounter

## 2018-02-27 NOTE — Telephone Encounter (Signed)
Advise pt of Pharm D recommendation. Pt verbalized she would also like to know if this is Dr. Kennon Holter recommendation as well. Routing to MD.

## 2018-02-27 NOTE — Telephone Encounter (Signed)
Sounds like a reasonable recommendation.

## 2018-02-27 NOTE — Telephone Encounter (Signed)
Follow up: ° ° °Patient returning call back °

## 2018-03-05 NOTE — Telephone Encounter (Signed)
Left message for patient of dr berry's recommendations.

## 2018-03-09 DIAGNOSIS — N814 Uterovaginal prolapse, unspecified: Secondary | ICD-10-CM | POA: Diagnosis not present

## 2018-03-20 ENCOUNTER — Telehealth: Payer: Self-pay | Admitting: Cardiovascular Disease

## 2018-03-20 NOTE — Telephone Encounter (Signed)
New Message   Pt c/o medication issue:  1. Name of Medication: metoprolol succinate (TOPROL-XL) 50 MG 24 hr tablet  2. How are you currently taking this medication (dosage and times per day)? TAKE ONE TABLET BY MOUTH DAILY, KEEP OV Patient taking differently: Take 50 mg by mouth daily.   3. Are you having a reaction (difficulty breathing--STAT)? yes  4. What is your medication issue? Pt states she was having some issues while taking this medication so she was told to start taking 1/2 doses but says she is still having weakness and not feeling well. Please call

## 2018-03-20 NOTE — Telephone Encounter (Signed)
Pt called still c/o of extreme weakness and fatigue.. She is taking her beta blocker on and off and feels it is causing her problems.. Per the pharmacist note.. Pt should be seen.. Offered Pt to seen today by Dr. Gwenlyn Found but she declined and wanted one later in the week. Will see Fabian Sharp on Friday 03/23/18.

## 2018-03-22 NOTE — Progress Notes (Signed)
Cardiology Office Note:    Date:  03/23/2018   ID:  TAMRE CASS, DOB 1941/01/26, MRN [id number]  PCP:  [name], MD  Cardiologist:  Quay Burow, MD   Referring MD: [name], MD   Chief Complaint  Patient presents with  . Medication Problem    History of Present Illness:    [name] Wright is a 77 y.o. female with a hx of CAD status post RCA stenting by Dr. Terrence Dupont in 10/2004, residual disease in her LAD and circumflex arteries, negative Myoview in 09/2009, hypertension, hyperlipidemia.  She was last seen in clinic on 10/11/2017.  At that time she was doing well.  She was maintained on amlodipine and toprol at that time.  Since this visit, she has called the office stating that she can no longer tolerate the Lopressor.  She was advised to cut the tablets in half and take 25 mg twice daily.  She is also maintained on Plavix.  Med list includes 2.5 mg norvasc and toprol,   She returns today for follow-up of his medication titration.  Even though she has been on Toprol for quite some time, she states that she has developed fatigue, roaring in her ears, and has no energy.  This is been going on for the past few months.  She did start cutting her Toprol tablets in half and taking 25 mg daily.  This helped for a little while, but her symptoms returned.  She is now taking half of a tablet every other day.  She feels better but fears that her symptoms will return again.  We discussed discontinuing Toprol and starting carvedilol.  She is willing to try this.  She denies chest pain, shortness of breath, dyspnea on exertion, orthopnea, lower extremity swelling, and syncope.  She states her blood pressure generally runs in the 120s at home.   Past Medical History:  Diagnosis Date  . Cancer ([place])    skin and eye  . Coronary artery disease    NUCLEAR STRESS TEST, 09/30/2009 - EKG negative for ischemia  . Diabetes mellitus without complication ([place])    Treated by diet  . Essential tremor     hands, sometimes legs   . Family history of coronary artery disease    Mother died 2  . H/O subconjunctival hemorrhage   . History of blood transfusion   . Hyperlipidemia   . Hypertension   . Myocardial infarction ([place]) 2006  . Retroperitoneal hematoma    S/P  . Shingles     Past Surgical History:  Procedure Laterality Date  . COLONOSCOPY    . CORONARY ANGIOPLASTY WITH STENT PLACEMENT  11/05/2004   RCA DES  . DILATION AND CURETTAGE OF UTERUS    . LID LESION EXCISION Left 02/14/2018   Procedure: LEFT LID LESION EXCISION;  Surgeon: Clista Bernhardt, MD;  Location: [facility];  Service: Ophthalmology;  Laterality: Left;  . RECONSTRUCTION OF EYELID Left 02/14/2018   Procedure: TOTAL RECONSTRUCTION OF EYELID;  Surgeon: Clista Bernhardt, MD;  Location: [facility];  Service: Ophthalmology;  Laterality: Left;  . SKIN FULL THICKNESS GRAFT Left 02/14/2018   Procedure: SKIN GRAFT FULL THICKNESS LEFT EYE;  Surgeon: Clista Bernhardt, MD;  Location: [facility];  Service: Ophthalmology;  Laterality: Left;  . TONSILECTOMY/ADENOIDECTOMY WITH MYRINGOTOMY    . TUBAL LIGATION      Current Medications: Current Meds  Medication Sig  . amLODipine (NORVASC) 2.5 MG tablet Take 1 tablet (2.5 mg total) by mouth  daily.  . clopidogrel (PLAVIX) 75 MG tablet Take 1 tablet (75 mg total) by mouth daily. (Patient taking differently: Take 75 mg by mouth every other day. )  . Coenzyme Q10 100 MG capsule Take 100 mg by mouth daily.   . Difluprednate (DUREZOL) 0.05 % EMUL Place 1 drop into the right eye daily.   . DULoxetine (CYMBALTA) 30 MG capsule Take 30 mg by mouth daily.  . famotidine (PEPCID AC) 10 MG chewable tablet Chew 10 mg by mouth 2 (two) times daily as needed for heartburn.  . feeding supplement, ENSURE ENLIVE, (ENSURE ENLIVE) LIQD Take 237 mLs by mouth 2 (two) times daily between meals. (Patient taking differently: Take 237 mLs by mouth daily. )  . fluticasone (FLONASE) 50 MCG/ACT nasal spray Place 2 sprays into  both nostrils daily as needed for allergies or rhinitis.  [name] loratadine (CLARITIN) 10 MG tablet Take 10 mg by mouth every other day.   . metFORMIN (GLUCOPHAGE-XR) 500 MG 24 hr tablet Take 1,500 mg by mouth daily with lunch.  . nitroGLYCERIN (NITROSTAT) 0.4 MG SL tablet DISSOLVE 1 TABLET UNDER THE TONGUE EVERY 5 MINUTES AS NEEDED FOR CHEST PAIN (Patient taking differently: Place 0.4 mg under the tongue every 5 (five) minutes as needed for chest pain. )  . pantoprazole (PROTONIX) 40 MG tablet Take 40 mg by mouth daily.   . pravastatin (PRAVACHOL) 20 MG tablet TAKE 1 TABLET EVERY EVENING (Patient taking differently: Take 20 mg by mouth every evening. )  . Propylene Glycol (SYSTANE BALANCE) 0.6 % SOLN Place 1 drop into both eyes 3 (three) times daily.  . [DISCONTINUED] Metoprolol Succinate 50 MG CS24 Take 0.5 tablets by mouth every other day.     Allergies:   Avelox [moxifloxacin hcl in nacl]; Altace [ramipril]; Hctz [hydrochlorothiazide]; Lisinopril; Losartan; Statins; and Sulfa antibiotics   Social History   Socioeconomic History  . Marital status: Divorced    Spouse name: Not on file  . Number of children: Not on file  . Years of education: Not on file  . Highest education level: Not on file  Occupational History  . Not on file  Social Needs  . Financial resource strain: Not on file  . Food insecurity:    Worry: Not on file    Inability: Not on file  . Transportation needs:    Medical: Not on file    Non-medical: Not on file  Tobacco Use  . Smoking status: Never Smoker  . Smokeless tobacco: Never Used  Substance and Sexual Activity  . Alcohol use: No    Alcohol/week: 0.0 standard drinks  . Drug use: No  . Sexual activity: Not on file  Lifestyle  . Physical activity:    Days per week: Not on file    Minutes per session: Not on file  . Stress: Not on file  Relationships  . Social connections:    Talks on phone: Not on file    Gets together: Not on file    Attends religious  service: Not on file    Active member of club or organization: Not on file    Attends meetings of clubs or organizations: Not on file    Relationship status: Not on file  Other Topics Concern  . Not on file  Social History Narrative  . Not on file     Family History: The patient's family history includes Cancer in her father; Heart attack (age of onset: 76) in her mother; Heart attack (age of onset:  22) in her maternal grandmother.  ROS:   Please see the history of present illness.     All other systems reviewed and are negative.  EKGs/Labs/Other Studies Reviewed:    The following studies were reviewed today:  none  EKG:  EKG is not ordered today.   Recent Labs: 02/14/2018: BUN 6; Creatinine, Ser 0.56; Hemoglobin 13.8; Platelets 305; Potassium 3.8; Sodium 138  Recent Lipid Panel    Component Value Date/Time   CHOL 186 01/14/2014 0805   TRIG 258 (H) 01/14/2014 0805   HDL 48 01/14/2014 0805   CHOLHDL 3.9 01/14/2014 0805   VLDL 52 (H) 01/14/2014 0805   LDLCALC 86 01/14/2014 0805    Physical Exam:    VS:  BP (!) 142/78 (BP Location: Right Arm, Patient Position: Sitting, Cuff Size: Normal)   Pulse 92   Ht 5' (1.524 m)   Wt 141 lb (64 kg)   SpO2 98%   BMI 27.54 kg/m     Wt Readings from Last 3 Encounters:  03/23/18 141 lb (64 kg)  02/14/18 140 lb (63.5 kg)  10/11/17 139 lb (63 kg)     GEN: Well nourished, well developed in no acute distress HEENT: Normal NECK: No JVD; No carotid bruits CARDIAC: RRR, no murmurs, rubs, gallops RESPIRATORY:  Clear to auscultation without rales, wheezing or rhonchi  ABDOMEN: Soft, non-tender, non-distended MUSCULOSKELETAL:  No edema; No deformity  SKIN: Warm and dry NEUROLOGIC:  Alert and oriented x 3 PSYCHIATRIC:  Normal affect   ASSESSMENT:    1. Coronary artery disease involving native coronary artery of native heart without angina pectoris   2. Essential hypertension   3. Pure hypercholesterolemia    PLAN:    In order  of problems listed above:  Coronary artery disease involving native coronary artery of native heart without angina pectoris Patient is taking Plavix about 3 times per week.  She denies anginal symptoms.  We discussed that a beta-blocker and her medication regimen is appropriate therapy for her heart.  She is willing to stop Toprol and start a very low dose of carvedilol.  We will start 3.125 mg Coreg twice daily.   Essential hypertension Pressure is mildly elevated here, but it generally runs in the 120s.  We will continue 2.5 mg of Norvasc.   Pure hypercholesterolemia She takes her statin most evenings.   Follow-up with me in 3 to 4 weeks.  Medication Adjustments/Labs and Tests Ordered: Current medicines are reviewed at length with the patient today.  Concerns regarding medicines are outlined above.  No orders of the defined types were placed in this encounter.  Meds ordered this encounter  Medications  . carvedilol (COREG) 3.125 MG tablet    Sig: Take 1 tablet (3.125 mg total) by mouth 2 (two) times daily with a meal.    Dispense:  60 tablet    Refill:  3    Signed, Ledora Bottcher, [place]  03/23/2018 8:58 AM    [facility]

## 2018-03-23 ENCOUNTER — Encounter: Payer: Self-pay | Admitting: Physician Assistant

## 2018-03-23 ENCOUNTER — Ambulatory Visit (INDEPENDENT_AMBULATORY_CARE_PROVIDER_SITE_OTHER): Payer: Medicare Other | Admitting: Physician Assistant

## 2018-03-23 VITALS — BP 142/78 | HR 92 | Ht 60.0 in | Wt 141.0 lb

## 2018-03-23 DIAGNOSIS — I2510 Atherosclerotic heart disease of native coronary artery without angina pectoris: Secondary | ICD-10-CM | POA: Diagnosis not present

## 2018-03-23 DIAGNOSIS — I10 Essential (primary) hypertension: Secondary | ICD-10-CM | POA: Diagnosis not present

## 2018-03-23 DIAGNOSIS — E7800 Pure hypercholesterolemia, unspecified: Secondary | ICD-10-CM | POA: Diagnosis not present

## 2018-03-23 MED ORDER — CARVEDILOL 3.125 MG PO TABS: 3.1250 mg | ORAL_TABLET | Freq: Two times a day (BID) | ORAL | 3 refills | Status: DC

## 2018-03-23 NOTE — Patient Instructions (Signed)
Medication Instructions:  STOP Metoprolol   START Carvedilol 3.125 mg twice daily.  If you need a refill on your cardiac medications before your next appointment, please call your pharmacy.   Lab work: NONE If you have labs (blood work) drawn today and your tests are completely normal, you will receive your results only by: [name] MyChart Message (if you have MyChart) OR . A paper copy in the mail If you have any lab test that is abnormal or we need to change your treatment, we will call you to review the results.  Testing/Procedures: NONE  Follow-Up: At [facility], you and your health needs are our priority.  As part of our continuing mission to provide you with exceptional heart care, we have created designated Provider Care Teams.  These Care Teams include your primary Cardiologist (physician) and Advanced Practice Providers (APPs -  Physician Assistants and Nurse Practitioners) who all work together to provide you with the care you need, when you need it. You will need a follow up appointment in 3-4 weeks with [name] Sharp, PA-C.  Any Other Special Instructions Will Be Listed Below (If Applicable). NONE

## 2018-03-27 DIAGNOSIS — D229 Melanocytic nevi, unspecified: Secondary | ICD-10-CM | POA: Diagnosis not present

## 2018-03-27 DIAGNOSIS — L821 Other seborrheic keratosis: Secondary | ICD-10-CM | POA: Diagnosis not present

## 2018-04-10 DIAGNOSIS — N814 Uterovaginal prolapse, unspecified: Secondary | ICD-10-CM | POA: Diagnosis not present

## 2018-04-17 DIAGNOSIS — B0230 Zoster ocular disease, unspecified: Secondary | ICD-10-CM | POA: Diagnosis not present

## 2018-04-19 ENCOUNTER — Ambulatory Visit (INDEPENDENT_AMBULATORY_CARE_PROVIDER_SITE_OTHER): Payer: Medicare Other | Admitting: Cardiology

## 2018-04-19 ENCOUNTER — Encounter: Payer: Self-pay | Admitting: Cardiology

## 2018-04-19 ENCOUNTER — Ambulatory Visit: Payer: Medicare Other | Admitting: Physician Assistant

## 2018-04-19 VITALS — BP 178/90 | HR 103 | Resp 16 | Ht 60.0 in | Wt 139.4 lb

## 2018-04-19 DIAGNOSIS — E119 Type 2 diabetes mellitus without complications: Secondary | ICD-10-CM

## 2018-04-19 DIAGNOSIS — I2510 Atherosclerotic heart disease of native coronary artery without angina pectoris: Secondary | ICD-10-CM

## 2018-04-19 DIAGNOSIS — E785 Hyperlipidemia, unspecified: Secondary | ICD-10-CM | POA: Diagnosis not present

## 2018-04-19 DIAGNOSIS — I10 Essential (primary) hypertension: Secondary | ICD-10-CM | POA: Diagnosis not present

## 2018-04-19 MED ORDER — DILTIAZEM HCL ER COATED BEADS 120 MG PO CP24: 120.0000 mg | ORAL_CAPSULE | Freq: Every day | ORAL | 3 refills | Status: DC

## 2018-04-19 NOTE — Progress Notes (Signed)
04/19/2018 [name] Wright   1940-07-14  [id number]  Primary Physician [name], MD Primary Cardiologist: Dr Gwenlyn Found  HPI:   Pleasant 77 y.o. frail-appearing divorced Caucasian female, mother of 27, grandmother of 50 grandchildren, who is followed by Dr Gwenlyn Found.. She has a history of CAD and is status post RCA stenting with a Cypher DES by Dr Terrence Dupont in May of 2006. She did have residual disease in her LAD and circumflex then treated medically. Her last Myoview, performed September 30, 2009, was non ischemic.[name] Other problems include hypertension and hyperlipidemia.  She has a history of multiple medication intolerances.  She can't tolerate an ACE or ARB.  She is intolerant to statins at higher doses.  She now has developed fatigue and intolerance to beta blockers, despite being on Toprol for some time.  On a recent office visit she complained of fatigue and "feeling bad" on Toprol. The dose was decreased with some improvement but her symptoms returned.  On 03/23/18 she was taken of Toprol and placed on Coreg 3.125 mg BID.  She is in the office today for follow up.  She tells me she could not tolerate the Coreg and cut it back to once a day.  This helped with her symptoms somewhat but she ended up stopping it altogether 2 days ago.  She says since stopping it she feels better.  Recent CBC and BMP from Sept 2019 are unremarkable except for random Glucose of 200.  TSH done last year was WNL.  She has noticed her HR is a little fast but she goes to her [employer] workout as usual and she tolerates that well.    Current Outpatient Medications  Medication Sig Dispense Refill  . clopidogrel (PLAVIX) 75 MG tablet Take 1 tablet (75 mg total) by mouth daily. (Patient taking differently: Take 75 mg by mouth every other day. ) 90 tablet 1  . Coenzyme Q10 100 MG capsule Take 100 mg by mouth daily.     . Difluprednate (DUREZOL) 0.05 % EMUL Place 1 drop into the right eye daily.     . DULoxetine (CYMBALTA) 30 MG  capsule Take 30 mg by mouth daily.    . famotidine (PEPCID AC) 10 MG chewable tablet Chew 10 mg by mouth 2 (two) times daily as needed for heartburn.    . feeding supplement, ENSURE ENLIVE, (ENSURE ENLIVE) LIQD Take 237 mLs by mouth 2 (two) times daily between meals. (Patient taking differently: Take 237 mLs by mouth daily. ) 237 mL 12  . fluticasone (FLONASE) 50 MCG/ACT nasal spray Place 2 sprays into both nostrils daily as needed for allergies or rhinitis.    [name] loratadine (CLARITIN) 10 MG tablet Take 10 mg by mouth every other day.     . metFORMIN (GLUCOPHAGE-XR) 500 MG 24 hr tablet Take 1,500 mg by mouth daily with lunch.    . nitroGLYCERIN (NITROSTAT) 0.4 MG SL tablet DISSOLVE 1 TABLET UNDER THE TONGUE EVERY 5 MINUTES AS NEEDED FOR CHEST PAIN (Patient taking differently: Place 0.4 mg under the tongue every 5 (five) minutes as needed for chest pain. ) 25 tablet 2  . pantoprazole (PROTONIX) 40 MG tablet Take 40 mg by mouth daily.     . pravastatin (PRAVACHOL) 20 MG tablet TAKE 1 TABLET EVERY EVENING (Patient taking differently: Take 20 mg by mouth every evening. ) 90 tablet 3  . Propylene Glycol (SYSTANE BALANCE) 0.6 % SOLN Place 1 drop into both eyes 3 (three) times daily.    [name]  diltiazem (CARDIZEM CD) 120 MG 24 hr capsule Take 1 capsule (120 mg total) by mouth daily. 90 capsule 3   No current facility-administered medications for this visit.     Allergies  Allergen Reactions  . Avelox [Moxifloxacin Hcl In Nacl] Anaphylaxis, Nausea Only, Swelling and Other (See Comments)    Throat and tongue swelling  . Altace [Ramipril] Other (See Comments)    Tremors and increased tinnitus  . Hctz [Hydrochlorothiazide] Other (See Comments)    Palpitations and tremors  . Lisinopril Other (See Comments)    Headaches  . Losartan Other (See Comments)    Palpitations and tremors  . Statins Other (See Comments)    Increased blood pressure  . Sulfa Antibiotics Rash    Past Medical History:  Diagnosis  Date  . Cancer ([place])    skin and eye  . Coronary artery disease    NUCLEAR STRESS TEST, 09/30/2009 - EKG negative for ischemia  . Diabetes mellitus without complication ([place])    Treated by diet  . Essential tremor    hands, sometimes legs   . Family history of coronary artery disease    Mother died 57  . H/O subconjunctival hemorrhage   . History of blood transfusion   . Hyperlipidemia   . Hypertension   . Myocardial infarction ([place]) 2006  . Retroperitoneal hematoma    S/P  . Shingles     Social History   Socioeconomic History  . Marital status: Divorced    Spouse name: Not on file  . Number of children: Not on file  . Years of education: Not on file  . Highest education level: Not on file  Occupational History  . Not on file  Social Needs  . Financial resource strain: Not on file  . Food insecurity:    Worry: Not on file    Inability: Not on file  . Transportation needs:    Medical: Not on file    Non-medical: Not on file  Tobacco Use  . Smoking status: Never Smoker  . Smokeless tobacco: Never Used  Substance and Sexual Activity  . Alcohol use: No    Alcohol/week: 0.0 standard drinks  . Drug use: No  . Sexual activity: Not on file  Lifestyle  . Physical activity:    Days per week: Not on file    Minutes per session: Not on file  . Stress: Not on file  Relationships  . Social connections:    Talks on phone: Not on file    Gets together: Not on file    Attends religious service: Not on file    Active member of club or organization: Not on file    Attends meetings of clubs or organizations: Not on file    Relationship status: Not on file  . Intimate partner violence:    Fear of current or ex partner: Not on file    Emotionally abused: Not on file    Physically abused: Not on file    Forced sexual activity: Not on file  Other Topics Concern  . Not on file  Social History Narrative  . Not on file     Family History  Problem Relation Age of Onset  .  Heart attack Mother 66  . Cancer Father        Colon  . Heart attack Maternal Grandmother 61     Review of Systems: General: negative for chills, fever, night sweats or weight changes.  Cardiovascular: negative for chest pain, dyspnea  on exertion, edema, orthopnea, palpitations, paroxysmal nocturnal dyspnea or shortness of breath Dermatological: negative for rash Respiratory: negative for cough or wheezing Urologic: negative for hematuria Abdominal: negative for nausea, vomiting, diarrhea, bright red blood per rectum, melena, or hematemesis Neurologic: negative for visual changes, syncope, or dizziness All other systems reviewed and are otherwise negative except as noted above.    Blood pressure (!) 178/90, pulse (!) 103, resp. rate 16, height 5' (1.524 m), weight 139 lb 6.4 oz (63.2 kg).  General appearance: alert, cooperative and no distress Lungs: clear to auscultation bilaterally Heart: regular rate and rhythm Extremities: no edema Skin: warm and dry Neurologic: Grossly normal  EKG NSR, ST- 103  ASSESSMENT AND PLAN:   CAD- RCA DES 2006 NUCLEAR STRESS TEST, 09/30/2009 - negative for ischemia  Non-insulin treated type 2 diabetes mellitus (HCC) On Glucophage  Hypertension Repeat B/P by me 142/74  Dyslipidemia, goal LDL below 70 On low dose Pravachol- followed by her PCP. Intolerant to higher dose statin Rx   PLAN  Unfortunately Ms Depp is intolerant to most of the medication she needs to be on.  I have suggested stopping her Norvasc 2.5 mg and adding diltiazem CD 120 mg, it may provide a little more chronotropic suppression.  She has a follow up with Dr Forde Dandy in two weeks.  If her heart rate is still elevated at that time we could consider trying Atenolol (cheap-generic),  Or Bystolic- (more expensive).   [name] PA-C 04/19/2018 12:23 PM

## 2018-04-19 NOTE — Assessment & Plan Note (Signed)
On low dose Pravachol- followed by her PCP. Intolerant to higher dose statin Rx

## 2018-04-19 NOTE — Patient Instructions (Signed)
Medication Instructions:  STOP Coreg STOP Amlodipine START Diltizem CD 120 mg daily. If you need a refill on your cardiac medications before your next appointment, please call your pharmacy.   Lab work: None ordered. If you have labs (blood work) drawn today and your tests are completely normal, you will receive your results only by: [name] MyChart Message (if you have MyChart) OR . A paper copy in the mail If you have any lab test that is abnormal or we need to change your treatment, we will call you to review the results.  Testing/Procedures: None Ordered.  Follow-Up: At [facility], you and your health needs are our priority.  As part of our continuing mission to provide you with exceptional heart care, we have created designated Provider Care Teams.  These Care Teams include your primary Cardiologist (physician) and Advanced Practice Providers (APPs -  Physician Assistants and Nurse Practitioners) who all work together to provide you with the care you need, when you need it. . You will need a follow up appointment in 2-3 months. You may see [name] Burow, MD  . Follow up with Primary Care Dr.South in 2 weeks.    Any Other Special Instructions Will Be Listed Below (If Applicable). None

## 2018-04-19 NOTE — Assessment & Plan Note (Signed)
NUCLEAR STRESS TEST, 09/30/2009 - negative for ischemia

## 2018-04-19 NOTE — Assessment & Plan Note (Signed)
Repeat B/P by me 142/74

## 2018-04-19 NOTE — Assessment & Plan Note (Signed)
On Glucophage 

## 2018-04-20 ENCOUNTER — Telehealth: Payer: Self-pay | Admitting: Cardiology

## 2018-04-20 MED ORDER — DILTIAZEM HCL ER COATED BEADS 120 MG PO CP24: 120.0000 mg | ORAL_CAPSULE | Freq: Every day | ORAL | 3 refills | Status: DC

## 2018-04-20 NOTE — Telephone Encounter (Signed)
Returned call to patient, patient states her pharmacy did not have the medication she was was suppose to start after  OV yesterday.   [facility], med is on backorder.     Patient request this be sent to [facility].  rx sent and called to verify they had medication.    Patient made aware and pick it up.

## 2018-04-20 NOTE — Telephone Encounter (Signed)
New message:       Pt c/o medication issue:  1. Name of Medication: diltiazem (CARDIZEM CD) 120 MG 24 hr capsule  2. How are you currently taking this medication (dosage and times per day)? Take 1 capsule (120 mg total) by mouth daily.  3. Are you having a reaction (difficulty breathing--STAT)? No  4. What is your medication issue? Pt is calling and states that a new prescription was supposed to be called into the pharmacy due to a change at her office visit on yesterday.

## 2018-05-01 DIAGNOSIS — I2510 Atherosclerotic heart disease of native coronary artery without angina pectoris: Secondary | ICD-10-CM | POA: Diagnosis not present

## 2018-05-01 DIAGNOSIS — M859 Disorder of bone density and structure, unspecified: Secondary | ICD-10-CM | POA: Diagnosis not present

## 2018-05-01 DIAGNOSIS — E7849 Other hyperlipidemia: Secondary | ICD-10-CM | POA: Diagnosis not present

## 2018-05-01 DIAGNOSIS — E1159 Type 2 diabetes mellitus with other circulatory complications: Secondary | ICD-10-CM | POA: Diagnosis not present

## 2018-05-01 DIAGNOSIS — F3289 Other specified depressive episodes: Secondary | ICD-10-CM | POA: Diagnosis not present

## 2018-05-01 DIAGNOSIS — E871 Hypo-osmolality and hyponatremia: Secondary | ICD-10-CM | POA: Diagnosis not present

## 2018-05-01 DIAGNOSIS — Z6825 Body mass index (BMI) 25.0-25.9, adult: Secondary | ICD-10-CM | POA: Diagnosis not present

## 2018-05-01 DIAGNOSIS — I10 Essential (primary) hypertension: Secondary | ICD-10-CM | POA: Diagnosis not present

## 2018-05-01 LAB — BASIC METABOLIC PANEL
BUN: 14 (ref 4–21)
CO2: 31 — AB (ref 13–22)
Chloride: 100 (ref 99–108)
Creatinine: 0.8 (ref 0.5–1.1)
Glucose: 342
Potassium: 4.5 (ref 3.4–5.3)
Sodium: 137 (ref 137–147)

## 2018-05-01 LAB — COMPREHENSIVE METABOLIC PANEL
Calcium: 9.7 (ref 8.7–10.7)
GFR calc Af Amer: 83.9
GFR calc non Af Amer: 69.4

## 2018-05-08 DIAGNOSIS — N814 Uterovaginal prolapse, unspecified: Secondary | ICD-10-CM | POA: Diagnosis not present

## 2018-05-08 DIAGNOSIS — R030 Elevated blood-pressure reading, without diagnosis of hypertension: Secondary | ICD-10-CM | POA: Insufficient documentation

## 2018-06-19 ENCOUNTER — Encounter (INDEPENDENT_AMBULATORY_CARE_PROVIDER_SITE_OTHER): Payer: Self-pay

## 2018-06-19 ENCOUNTER — Ambulatory Visit (INDEPENDENT_AMBULATORY_CARE_PROVIDER_SITE_OTHER): Payer: Medicare Other | Admitting: Cardiovascular Disease

## 2018-06-19 ENCOUNTER — Encounter: Payer: Self-pay | Admitting: Cardiovascular Disease

## 2018-06-19 VITALS — BP 124/70 | HR 99 | Ht 62.0 in

## 2018-06-19 DIAGNOSIS — I2510 Atherosclerotic heart disease of native coronary artery without angina pectoris: Secondary | ICD-10-CM | POA: Diagnosis not present

## 2018-06-19 DIAGNOSIS — I10 Essential (primary) hypertension: Secondary | ICD-10-CM

## 2018-06-19 DIAGNOSIS — E785 Hyperlipidemia, unspecified: Secondary | ICD-10-CM | POA: Diagnosis not present

## 2018-06-19 NOTE — Assessment & Plan Note (Signed)
History of dyslipidemia on statin therapy with lipid profile performed 06/27/2017 revealing a total cholesterol 191, LDL 94 and HDL 48.

## 2018-06-19 NOTE — Assessment & Plan Note (Signed)
History of CAD status post RCA stenting by Dr. Terrence Dupont May 2006.  She did have residual disease in her LAD and circumflex coronary arteries.  Last Myoview performed 09/30/2009 was nonischemic.  She denies chest pain or shortness of breath.

## 2018-06-19 NOTE — Patient Instructions (Signed)
Medication Instructions:  NO CHANGES If you need a refill on your cardiac medications before your next appointment, please call your pharmacy.   Lab work: NONE If you have labs (blood work) drawn today and your tests are completely normal, you will receive your results only by: [name] MyChart Message (if you have MyChart) OR . A paper copy in the mail If you have any lab test that is abnormal or we need to change your treatment, we will call you to review the results.  Testing/Procedures: NONE  Follow-Up: At [facility], you and your health needs are our priority.  As part of our continuing mission to provide you with exceptional heart care, we have created designated Provider Care Teams.  These Care Teams include your primary Cardiologist (physician) and Advanced Practice Providers (APPs -  Physician Assistants and Nurse Practitioners) who all work together to provide you with the care you need, when you need it. You will need a follow up appointment in 6 months with [name], PA and 12 months with Dr. Gwenlyn Found.  Please call our office 2 months in advance to schedule each appointment.

## 2018-06-19 NOTE — Progress Notes (Signed)
06/19/2018 ALYSON KI   1940/11/01  [id number]  Primary Physician [name], MD Primary Cardiologist: Lorretta Harp MD FACP, [place], [place], [place]  HPI:  [name] Wright is a 78 y.o.  thin and frail-appearing divorced Caucasian female, mother of 68, grandmother of 5 grandchildren, who I last saw in the 10/11/2017.[name] She has a history of CAD status post RCA stenting with a Cypher drug-eluting stent by Dr. Charolette Forward in May of 2006. She did have residual disease in her LAD and circumflex coronary arteries. Her last Myoview, performed September 30, 2009, was non ischemic.[name] Her other problems include hypertension, hyperlipidemia, on Pravachol.. She's done well since that time and is asymptomatic. She apparently may have needed GYN surgery but elected not to pursue this.   Since I saw her 7 months ago she has remained stable.  We were adjusting her blood pressure medicines including her metoprolol and carvedilol which she did not tolerate.  She is currently on diltiazem.  She denies chest pain or shortness of breath.  Current Meds  Medication Sig  . clopidogrel (PLAVIX) 75 MG tablet Take 1 tablet (75 mg total) by mouth daily. (Patient taking differently: Take 75 mg by mouth every other day. )  . Coenzyme Q10 100 MG capsule Take 100 mg by mouth daily.   . Difluprednate (DUREZOL) 0.05 % EMUL Place 1 drop into the right eye daily.   [name] diltiazem (CARDIZEM CD) 120 MG 24 hr capsule Take 1 capsule (120 mg total) by mouth daily.  . DULoxetine (CYMBALTA) 30 MG capsule Take 30 mg by mouth daily.  [name] Ertugliflozin L-PyroglutamicAc (STEGLATRO) 5 MG TABS Take 5 mg by mouth daily.  . famotidine (PEPCID AC) 10 MG chewable tablet Chew 10 mg by mouth 2 (two) times daily as needed for heartburn.  . feeding supplement, ENSURE ENLIVE, (ENSURE ENLIVE) LIQD Take 237 mLs by mouth 2 (two) times daily between meals. (Patient taking differently: Take 237 mLs by mouth daily. )  . fluticasone (FLONASE) 50 MCG/ACT nasal  spray Place 2 sprays into both nostrils daily as needed for allergies or rhinitis.  [name] loratadine (CLARITIN) 10 MG tablet Take 10 mg by mouth every other day.   . metFORMIN (GLUCOPHAGE-XR) 500 MG 24 hr tablet Take 1,500 mg by mouth daily with lunch.  . nitroGLYCERIN (NITROSTAT) 0.4 MG SL tablet DISSOLVE 1 TABLET UNDER THE TONGUE EVERY 5 MINUTES AS NEEDED FOR CHEST PAIN (Patient taking differently: Place 0.4 mg under the tongue every 5 (five) minutes as needed for chest pain. )  . pantoprazole (PROTONIX) 40 MG tablet Take 40 mg by mouth daily.   . pravastatin (PRAVACHOL) 20 MG tablet TAKE 1 TABLET EVERY EVENING (Patient taking differently: Take 20 mg by mouth every evening. )  . Propylene Glycol (SYSTANE BALANCE) 0.6 % SOLN Place 1 drop into both eyes 3 (three) times daily.     Allergies  Allergen Reactions  . Avelox [Moxifloxacin Hcl In Nacl] Anaphylaxis, Nausea Only, Swelling and Other (See Comments)    Throat and tongue swelling  . Altace [Ramipril] Other (See Comments)    Tremors and increased tinnitus  . Hctz [Hydrochlorothiazide] Other (See Comments)    Palpitations and tremors  . Lisinopril Other (See Comments)    Headaches  . Losartan Other (See Comments)    Palpitations and tremors  . Statins Other (See Comments)    Increased blood pressure  . Sulfa Antibiotics Rash    Social History   Socioeconomic History  .  Marital status: Divorced    Spouse name: Not on file  . Number of children: Not on file  . Years of education: Not on file  . Highest education level: Not on file  Occupational History  . Not on file  Social Needs  . Financial resource strain: Not on file  . Food insecurity:    Worry: Not on file    Inability: Not on file  . Transportation needs:    Medical: Not on file    Non-medical: Not on file  Tobacco Use  . Smoking status: Never Smoker  . Smokeless tobacco: Never Used  Substance and Sexual Activity  . Alcohol use: No    Alcohol/week: 0.0 standard  drinks  . Drug use: No  . Sexual activity: Not on file  Lifestyle  . Physical activity:    Days per week: Not on file    Minutes per session: Not on file  . Stress: Not on file  Relationships  . Social connections:    Talks on phone: Not on file    Gets together: Not on file    Attends religious service: Not on file    Active member of club or organization: Not on file    Attends meetings of clubs or organizations: Not on file    Relationship status: Not on file  . Intimate partner violence:    Fear of current or ex partner: Not on file    Emotionally abused: Not on file    Physically abused: Not on file    Forced sexual activity: Not on file  Other Topics Concern  . Not on file  Social History Narrative  . Not on file     Review of Systems: General: negative for chills, fever, night sweats or weight changes.  Cardiovascular: negative for chest pain, dyspnea on exertion, edema, orthopnea, palpitations, paroxysmal nocturnal dyspnea or shortness of breath Dermatological: negative for rash Respiratory: negative for cough or wheezing Urologic: negative for hematuria Abdominal: negative for nausea, vomiting, diarrhea, bright red blood per rectum, melena, or hematemesis Neurologic: negative for visual changes, syncope, or dizziness All other systems reviewed and are otherwise negative except as noted above.    Blood pressure 124/70, pulse 99, height 5\' 2"  (1.575 m).  General appearance: alert and no distress Neck: no adenopathy, no carotid bruit, no JVD, supple, symmetrical, trachea midline and thyroid not enlarged, symmetric, no tenderness/mass/nodules Lungs: clear to auscultation bilaterally Heart: regular rate and rhythm, S1, S2 normal, no murmur, click, rub or gallop Extremities: extremities normal, atraumatic, no cyanosis or edema Pulses: 2+ and symmetric Skin: Skin color, texture, turgor normal. No rashes or lesions Neurologic: Alert and oriented X 3, normal strength  and tone. Normal symmetric reflexes. Normal coordination and gait  EKG sinus rhythm at 99 without ST or T wave changes. I Personally reviewed this EKG.  ASSESSMENT AND PLAN:   CAD- RCA DES 2006 History of CAD status post RCA stenting by Dr. Terrence Dupont May 2006.  She did have residual disease in her LAD and circumflex coronary arteries.  Last Myoview performed 09/30/2009 was nonischemic.  She denies chest pain or shortness of breath.  Hypertension History of essential hypertension with blood pressure measured today 124/70.  She is on diltiazem.  Continue current medications.  She apparently was on metoprolol and carvedilol in the past which she was intolerant to.  Dyslipidemia, goal LDL below 70 History of dyslipidemia on statin therapy with lipid profile performed 06/27/2017 revealing a total cholesterol 191, LDL 94  and HDL 48.      Lorretta Harp MD FACP,FACC,FAHA, [facility] 06/19/2018 1:50 PM

## 2018-06-19 NOTE — Assessment & Plan Note (Signed)
History of essential hypertension with blood pressure measured today 124/70.  She is on diltiazem.  Continue current medications.  She apparently was on metoprolol and carvedilol in the past which she was intolerant to.

## 2018-06-22 DIAGNOSIS — N814 Uterovaginal prolapse, unspecified: Secondary | ICD-10-CM | POA: Diagnosis not present

## 2018-07-19 ENCOUNTER — Telehealth: Payer: Self-pay | Admitting: Cardiovascular Disease

## 2018-07-19 NOTE — Telephone Encounter (Signed)
Per pt continues to c/o constipation and has tried Miralax with some relief continues to feel miserable .Discussed this at last office visit with Dr Gwenlyn Found . Will forward to Dr Gwenlyn Found for review and recommendations

## 2018-07-19 NOTE — Telephone Encounter (Signed)
Please refer to [name] Wright to discuss

## 2018-07-19 NOTE — Telephone Encounter (Signed)
New message    Pt c/o medication issue:  1. Name of Medication: diltiazem (CARDIZEM CD) 120 MG 24 hr capsule  2. How are you currently taking this medication (dosage and times per day)?1 time daily    3. Are you having a reaction (difficulty breathing--STAT)? No   4. What is your medication issue? Patient states that this medication is causing constipation. Patient would like to try another medication.

## 2018-07-20 MED ORDER — AMLODIPINE BESYLATE 2.5 MG PO TABS: 2.5000 mg | ORAL_TABLET | Freq: Every day | ORAL | 3 refills | Status: DC

## 2018-07-20 NOTE — Telephone Encounter (Signed)
Reviewed chart.  Patient was apparently on amlodipine 2.5 mg prior to starting diltiazem.  No indication in allergy section of adverse reaction.  Had problems with other antihypertensive medications in past.  Will have her stop diltiazem and start amlodipine 2.5 mg daily.  Scheduled [facility] appt for 2 weeks for follow up.

## 2018-07-31 DIAGNOSIS — Z124 Encounter for screening for malignant neoplasm of cervix: Secondary | ICD-10-CM | POA: Diagnosis not present

## 2018-07-31 DIAGNOSIS — Z1231 Encounter for screening mammogram for malignant neoplasm of breast: Secondary | ICD-10-CM | POA: Diagnosis not present

## 2018-08-03 ENCOUNTER — Ambulatory Visit: Payer: Medicare Other

## 2018-08-03 ENCOUNTER — Telehealth: Payer: Self-pay | Admitting: Cardiovascular Disease

## 2018-08-03 DIAGNOSIS — B977 Papillomavirus as the cause of diseases classified elsewhere: Secondary | ICD-10-CM | POA: Insufficient documentation

## 2018-08-03 NOTE — Telephone Encounter (Signed)
Spoke with patient and she cancelled blood pressure check today secondary to the weather. She did have blood pressure checked at GYN this week and her reading was 120's/? She does npt check her blood pressure at home, no home monitor. Patient wanted to know if she needed to reschedule HTN clinic appointment Discussed with Pharm D and would recommend follow up in 2-3 weeks. Rescheduled appointment, patient aware of date and time.

## 2018-08-03 NOTE — Telephone Encounter (Signed)
New Messaage:   Patient calling because she cancel her pharmacy appt. Patient states she went to her GYN and they took her BP and states is was fine. Please cal patient if she need to reschedule her appt.

## 2018-08-07 DIAGNOSIS — E7849 Other hyperlipidemia: Secondary | ICD-10-CM | POA: Diagnosis not present

## 2018-08-07 DIAGNOSIS — E871 Hypo-osmolality and hyponatremia: Secondary | ICD-10-CM | POA: Diagnosis not present

## 2018-08-07 DIAGNOSIS — Z6824 Body mass index (BMI) 24.0-24.9, adult: Secondary | ICD-10-CM | POA: Diagnosis not present

## 2018-08-07 DIAGNOSIS — E1159 Type 2 diabetes mellitus with other circulatory complications: Secondary | ICD-10-CM | POA: Diagnosis not present

## 2018-08-07 DIAGNOSIS — I10 Essential (primary) hypertension: Secondary | ICD-10-CM | POA: Diagnosis not present

## 2018-08-07 DIAGNOSIS — I2510 Atherosclerotic heart disease of native coronary artery without angina pectoris: Secondary | ICD-10-CM | POA: Diagnosis not present

## 2018-08-09 ENCOUNTER — Other Ambulatory Visit: Payer: Self-pay | Admitting: Cardiovascular Disease

## 2018-08-09 MED ORDER — NITROGLYCERIN 0.4 MG SL SUBL: 0.4000 mg | SUBLINGUAL_TABLET | SUBLINGUAL | 3 refills | Status: DC | PRN

## 2018-08-09 NOTE — Telephone Encounter (Signed)
Rx(s) sent to pharmacy electronically.  

## 2018-08-09 NOTE — Telephone Encounter (Signed)
New Message   *STAT* If patient is at the pharmacy, call can be transferred to refill team.   1. Which medications need to be refilled? (please list name of each medication and dose if known) Nitroglycerin   2. Which pharmacy/location (including street and city if local pharmacy) is medication to be sent to? [facility] on [address]   3. Do they need a 30 day or 90 day supply? [place]

## 2018-08-23 ENCOUNTER — Other Ambulatory Visit: Payer: Self-pay

## 2018-08-23 ENCOUNTER — Ambulatory Visit (INDEPENDENT_AMBULATORY_CARE_PROVIDER_SITE_OTHER): Payer: Medicare Other | Admitting: Pharmacist Clinician (PhC)/ Clinical Pharmacy Specialist

## 2018-08-23 DIAGNOSIS — I10 Essential (primary) hypertension: Secondary | ICD-10-CM | POA: Diagnosis not present

## 2018-08-23 DIAGNOSIS — I2510 Atherosclerotic heart disease of native coronary artery without angina pectoris: Secondary | ICD-10-CM

## 2018-08-23 MED ORDER — CARVEDILOL 3.125 MG PO TABS: 3.1250 mg | ORAL_TABLET | Freq: Two times a day (BID) | ORAL | 3 refills | Status: DC

## 2018-08-23 NOTE — Progress Notes (Signed)
08/24/2018 SHARRONDA SCHWEERS 17-Jan-1941 [id number]   HPI:  [name] Wright is a 78 y.o. female patient of Dr Gwenlyn Found, with a PMH below who presents today for hypertension clinic evaluation.  In addition to hypertension, her medical history is significant for CAD (s/p DES to RCA 2006), hyperlipidemia and DM2 (A1c 8.7).    Patient had been previously prescribed diltiazem, but has recently noted that it is increasing her problems with constipation.  She was switched to amlodipine 2.5 mg once daily about 4-6 weeks ago.  She reports constipation has gone and is feeling well.  She does not have a home BP monitor, but does see her gynecologist on a monthly basis and it usually checks out fine. She had shingles in 2018 and still has some residual itching and pain around her eye socket, although it comes and goes.  Patient also notes that she has an essential tremor and baseline tinnitus, and has found that some medications make one or the other of these issues worse.     Blood Pressure Goal:  130/80  Current Medications:  Amlodipine 2.5 mg qd  Family Hx:  Mother died early in life (30) multiple medical and mental issues  Father had MI in his 75's, lived to 99  No siblings  Two children with hypertension   Social Hx:  No tobacco, occasional wine when eating out; 1 coffee per day; drinks some diet soda, ginger ale  Diet:  Mostly home cooked; no deep fried; not big on veggies; likes fruit - grapes, banana, berries (blueberries every morning in her cereal)  Exercise:  Silver Sneakers at [facility] twice weekly  Home BP readings:  Does not have a home meter, states monthly readings at gynecology office all seem to be WNL  Intolerances:   Beta blockers - fatigue  Ramipril - tremors, tinnitus  HCTZ - palpitations and tremors  Lisinopril - headaches  Losartan - palpitations, tremors  Labs:  02/2018:  Na 138, K 3.8, Glu 182, BUN 6, SCr 0.56 Wt Readings from Last 3 Encounters:  04/19/18 139 lb 6.4  oz (63.2 kg)  03/23/18 141 lb (64 kg)  02/14/18 140 lb (63.5 kg)   BP Readings from Last 3 Encounters:  08/23/18 (!) 142/78  06/19/18 124/70  04/19/18 (!) 178/90   Pulse Readings from Last 3 Encounters:  08/23/18 92  06/19/18 99  04/19/18 (!) 103    Current Outpatient Medications  Medication Sig Dispense Refill   amLODipine (NORVASC) 2.5 MG tablet Take 1 tablet (2.5 mg total) by mouth daily. 30 tablet 3   carvedilol (COREG) 3.125 MG tablet Take 1 tablet (3.125 mg total) by mouth 2 (two) times daily. 60 tablet 3   clopidogrel (PLAVIX) 75 MG tablet Take 1 tablet (75 mg total) by mouth daily. (Patient taking differently: Take 75 mg by mouth every other day. ) 90 tablet 1   Coenzyme Q10 100 MG capsule Take 100 mg by mouth daily.      Difluprednate (DUREZOL) 0.05 % EMUL Place 1 drop into the right eye daily.      DULoxetine (CYMBALTA) 30 MG capsule Take 30 mg by mouth daily.     Ertugliflozin L-PyroglutamicAc (STEGLATRO) 5 MG TABS Take 5 mg by mouth daily.     famotidine (PEPCID AC) 10 MG chewable tablet Chew 10 mg by mouth 2 (two) times daily as needed for heartburn.     feeding supplement, ENSURE ENLIVE, (ENSURE ENLIVE) LIQD Take 237 mLs by mouth 2 (two)  times daily between meals. (Patient taking differently: Take 237 mLs by mouth daily. ) 237 mL 12   fluticasone (FLONASE) 50 MCG/ACT nasal spray Place 2 sprays into both nostrils daily as needed for allergies or rhinitis.     loratadine (CLARITIN) 10 MG tablet Take 10 mg by mouth every other day.      metFORMIN (GLUCOPHAGE-XR) 500 MG 24 hr tablet Take 1,500 mg by mouth daily with lunch.     nitroGLYCERIN (NITROSTAT) 0.4 MG SL tablet Place 1 tablet (0.4 mg total) under the tongue every 5 (five) minutes as needed for chest pain. 25 tablet 3   pantoprazole (PROTONIX) 40 MG tablet Take 40 mg by mouth daily.      pravastatin (PRAVACHOL) 20 MG tablet TAKE 1 TABLET EVERY EVENING (Patient taking differently: Take 20 mg by mouth  every evening. ) 90 tablet 3   Propylene Glycol (SYSTANE BALANCE) 0.6 % SOLN Place 1 drop into both eyes 3 (three) times daily.     No current facility-administered medications for this visit.     Allergies  Allergen Reactions   Avelox [Moxifloxacin Hcl In Nacl] Anaphylaxis, Nausea Only, Swelling and Other (See Comments)    Throat and tongue swelling   Altace [Ramipril] Other (See Comments)    Tremors and increased tinnitus   Hctz [Hydrochlorothiazide] Other (See Comments)    Palpitations and tremors   Lisinopril Other (See Comments)    Headaches   Losartan Other (See Comments)    Palpitations and tremors   Statins Other (See Comments)    Increased blood pressure   Sulfa Antibiotics Rash    Past Medical History:  Diagnosis Date   Cancer ([place])    skin and eye   Coronary artery disease    NUCLEAR STRESS TEST, 09/30/2009 - EKG negative for ischemia   Diabetes mellitus without complication (HCC)    Treated by diet   Essential tremor    hands, sometimes legs    Family history of coronary artery disease    Mother died 33   H/O subconjunctival hemorrhage    History of blood transfusion    Hyperlipidemia    Hypertension    Myocardial infarction ([place]) 2006   Retroperitoneal hematoma    S/P   Shingles     Blood pressure (!) 142/78, pulse 92.  Hypertension Patient with essential hypertension, currently not at goal.  She still has plenty of carvedilol 3.125 mg, so will have her re-start that today with 3.125 mg bid.  I believe this dose will be low enough as to not cause her any problems with fatigue.  She will continue to monitor her BP thru her other physician visits and knows to call should she have any concerns with elevated readings or side effects.    Tommy Medal PharmD CPP [facility] [address] [place], [place] [address] [phone]

## 2018-08-23 NOTE — Patient Instructions (Signed)
  Your blood pressure today is 142/78  Check your blood pressure at home as able and keep record of the readings.  Take your BP meds as follows:  Continue amlodipine 2.5 mg daily  Re-start carvedilol 3.125 mg twice daily (breakfast and dinner or bedtime)  Bring all of your meds, your BP cuff and your record of home blood pressures to your next appointment.  Exercise as you're able, try to walk approximately 30 minutes per day.  Keep salt intake to a minimum, especially watch canned and prepared boxed foods.  Eat more fresh fruits and vegetables and fewer canned items.  Avoid eating in fast food restaurants.    HOW TO TAKE YOUR BLOOD PRESSURE: . Rest 5 minutes before taking your blood pressure. .  Don't smoke or drink caffeinated beverages for at least 30 minutes before. . Take your blood pressure before (not after) you eat. . Sit comfortably with your back supported and both feet on the floor (don't cross your legs). . Elevate your arm to heart level on a table or a desk. . Use the proper sized cuff. It should fit smoothly and snugly around your bare upper arm. There should be enough room to slip a fingertip under the cuff. The bottom edge of the cuff should be 1 inch above the crease of the elbow. . Ideally, take 3 measurements at one sitting and record the average.

## 2018-08-24 ENCOUNTER — Encounter: Payer: Self-pay | Admitting: Pharmacist Clinician (PhC)/ Clinical Pharmacy Specialist

## 2018-08-24 NOTE — Assessment & Plan Note (Addendum)
Patient with essential hypertension, currently not at goal.  She still has plenty of carvedilol 3.125 mg, so will have her re-start that today with 3.125 mg bid.  I believe this dose will be low enough as to not cause her any problems with fatigue.  She will continue to monitor her BP thru her other physician visits and knows to call should she have any concerns with elevated readings or side effects.

## 2018-10-12 DIAGNOSIS — N814 Uterovaginal prolapse, unspecified: Secondary | ICD-10-CM | POA: Diagnosis not present

## 2018-10-16 DIAGNOSIS — B0230 Zoster ocular disease, unspecified: Secondary | ICD-10-CM | POA: Diagnosis not present

## 2018-10-16 DIAGNOSIS — Z961 Presence of intraocular lens: Secondary | ICD-10-CM | POA: Diagnosis not present

## 2018-12-06 DIAGNOSIS — N814 Uterovaginal prolapse, unspecified: Secondary | ICD-10-CM | POA: Diagnosis not present

## 2018-12-11 DIAGNOSIS — I2510 Atherosclerotic heart disease of native coronary artery without angina pectoris: Secondary | ICD-10-CM | POA: Diagnosis not present

## 2018-12-11 DIAGNOSIS — I10 Essential (primary) hypertension: Secondary | ICD-10-CM | POA: Diagnosis not present

## 2018-12-11 DIAGNOSIS — E785 Hyperlipidemia, unspecified: Secondary | ICD-10-CM | POA: Diagnosis not present

## 2018-12-11 DIAGNOSIS — K219 Gastro-esophageal reflux disease without esophagitis: Secondary | ICD-10-CM | POA: Diagnosis not present

## 2018-12-11 DIAGNOSIS — N814 Uterovaginal prolapse, unspecified: Secondary | ICD-10-CM | POA: Diagnosis not present

## 2018-12-11 DIAGNOSIS — E1159 Type 2 diabetes mellitus with other circulatory complications: Secondary | ICD-10-CM | POA: Diagnosis not present

## 2018-12-11 DIAGNOSIS — E871 Hypo-osmolality and hyponatremia: Secondary | ICD-10-CM | POA: Diagnosis not present

## 2018-12-11 DIAGNOSIS — F329 Major depressive disorder, single episode, unspecified: Secondary | ICD-10-CM | POA: Diagnosis not present

## 2018-12-11 DIAGNOSIS — M8580 Other specified disorders of bone density and structure, unspecified site: Secondary | ICD-10-CM | POA: Diagnosis not present

## 2018-12-16 ENCOUNTER — Other Ambulatory Visit: Payer: Self-pay | Admitting: Cardiovascular Disease

## 2018-12-20 ENCOUNTER — Encounter: Payer: Self-pay | Admitting: Cardiology

## 2018-12-20 ENCOUNTER — Ambulatory Visit (INDEPENDENT_AMBULATORY_CARE_PROVIDER_SITE_OTHER): Payer: Medicare Other | Admitting: Cardiology

## 2018-12-20 ENCOUNTER — Other Ambulatory Visit: Payer: Self-pay

## 2018-12-20 VITALS — BP 139/79 | HR 105 | Ht 60.0 in | Wt 126.0 lb

## 2018-12-20 DIAGNOSIS — I2510 Atherosclerotic heart disease of native coronary artery without angina pectoris: Secondary | ICD-10-CM | POA: Diagnosis not present

## 2018-12-20 DIAGNOSIS — Z789 Other specified health status: Secondary | ICD-10-CM | POA: Diagnosis not present

## 2018-12-20 DIAGNOSIS — R0609 Other forms of dyspnea: Secondary | ICD-10-CM | POA: Diagnosis not present

## 2018-12-20 MED ORDER — CLOPIDOGREL BISULFATE 75 MG PO TABS: 75.0000 mg | ORAL_TABLET | Freq: Every day | ORAL | 3 refills | Status: DC

## 2018-12-20 NOTE — Progress Notes (Signed)
Patient referred by [name], MD for coronary artery disease  Subjective:   [name] Wright, female    DOB: [birth year], 78 y.o.   MRN: [id number]   Chief Complaint  Patient presents with  . New Patient (Initial Visit)  . Coronary Artery Disease    s/p stent     HPI  78 y.o. Caucasian female with hypertension, hyperlipidemia, CAD s/p RCA PCI in 2006 (Dr. Terrence Dupont), residual nonobstructive disease in LAD, LCx, stress test wiht no ischemia 2011.  She is here to establish cardiac care.  Patient lives with her daughter and son-in-law.  Her physical activity includes walking around the house.  She climbs flight of stairs inside her house at least twice a day.  She has mild exertional dyspnea with this level of activity.  She denies any chest pain.  Patient reports having had chest pain in 2006 when she had an MI that led to PCI to RCA.  Hypertension and hyperlipidemia managed by Dr. Forde Dandy.  She is intolerant to several different medications.  She currently takes Plavix every other day and amlodipine 2.5 mg every day.   Past Medical History:  Diagnosis Date  . BCC (basal cell carcinoma of skin) 06/24/1993   Left Shoulder  . 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 Cancer ([place])    skin and eye  . Coronary artery disease    NUCLEAR STRESS TEST, 09/30/2009 - EKG negative for ischemia  . Diabetes mellitus without complication ([place])    Treated by diet  . Essential tremor    hands, sometimes legs   . Family history of coronary artery disease    Mother died 57  . H/O subconjunctival hemorrhage   . History of blood transfusion   . Hyperlipidemia   . Hypertension   . 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 Myocardial infarction ([place]) 2006  . 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 Retroperitoneal hematoma    S/P  . Shingles   . 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 Superficial nodular basal cell carcinoma (BCC) 03/02/2016   Left Shoulder     Past Surgical History:  Procedure Laterality Date  . COLONOSCOPY    . CORONARY ANGIOPLASTY WITH STENT PLACEMENT  11/05/2004   RCA DES  . DILATION AND CURETTAGE OF UTERUS    . LID LESION EXCISION Left 02/14/2018   Procedure: LEFT LID LESION EXCISION;  Surgeon: Clista Bernhardt, MD;  Location: [place];  Service: Ophthalmology;  Laterality: Left;  . RECONSTRUCTION OF EYELID Left 02/14/2018   Procedure: TOTAL RECONSTRUCTION OF EYELID;  Surgeon: Clista Bernhardt, MD;  Location: [place];  Service: Ophthalmology;  Laterality: Left;  . SKIN FULL THICKNESS GRAFT Left 02/14/2018   Procedure: SKIN GRAFT FULL THICKNESS LEFT EYE;  Surgeon: Clista Bernhardt, MD;  Location: [place];  Service: Ophthalmology;  Laterality: Left;  . TONSILECTOMY/ADENOIDECTOMY WITH MYRINGOTOMY    . TUBAL LIGATION       Social History   Socioeconomic History  . Marital status: Widowed    Spouse name: Not on file  . Number of children: 4  . Years of education: Not on file  . Highest education level: Not on file  Occupational History  . Not on file  Social Needs  . Financial resource strain: Not on file  . Food  insecurity    Worry: Not on file    Inability: Not on file  . Transportation needs    Medical: Not on file    Non-medical: Not on file  Tobacco Use  . Smoking status: Never Smoker  . Smokeless tobacco: Never Used  Substance and Sexual Activity  . Alcohol use: No    Alcohol/week: 0.0 standard drinks  . Drug use: No  . Sexual activity: Not on file  Lifestyle  . Physical activity    Days per week: Not on file    Minutes per session: Not on file  . Stress: Not on file  Relationships  . Social Herbalist on phone: Not on file    Gets together: Not on file    Attends religious service: Not on file    Active member of club or organization: Not on file    Attends meetings of clubs or  organizations: Not on file    Relationship status: Not on file  . Intimate partner violence    Fear of current or ex partner: Not on file    Emotionally abused: Not on file    Physically abused: Not on file    Forced sexual activity: Not on file  Other Topics Concern  . Not on file  Social History Narrative  . Not on file     Family History  Problem Relation Age of Onset  . Heart attack Mother 7  . Cancer Father        Colon  . Heart attack Maternal Grandmother 61     Current Outpatient Medications on File Prior to Visit  Medication Sig Dispense Refill  . clopidogrel (PLAVIX) 75 MG tablet TAKE ONE TABLET BY MOUTH DAILY 90 tablet 2  . Coenzyme Q10 100 MG capsule Take 100 mg by mouth daily.     . Difluprednate (DUREZOL) 0.05 % EMUL Place 1 drop into the right eye daily.     . DULoxetine (CYMBALTA) 30 MG capsule Take 30 mg by mouth daily.    [name] Ertugliflozin L-PyroglutamicAc (STEGLATRO) 5 MG TABS Take 5 mg by mouth daily.    . famotidine (PEPCID AC) 10 MG chewable tablet Chew 10 mg by mouth 2 (two) times daily as needed for heartburn.    . feeding supplement, ENSURE ENLIVE, (ENSURE ENLIVE) LIQD Take 237 mLs by mouth 2 (two) times daily between meals. (Patient taking differently: Take 237 mLs by mouth daily. ) 237 mL 12  . fluticasone (FLONASE) 50 MCG/ACT nasal spray Place 2 sprays into both nostrils daily as needed for allergies or rhinitis.    [name] loratadine (CLARITIN) 10 MG tablet Take 10 mg by mouth every other day.     . metFORMIN (GLUCOPHAGE-XR) 500 MG 24 hr tablet Take 1,500 mg by mouth daily with lunch.    . nitroGLYCERIN (NITROSTAT) 0.4 MG SL tablet Place 1 tablet (0.4 mg total) under the tongue every 5 (five) minutes as needed for chest pain. 25 tablet 3  . pantoprazole (PROTONIX) 40 MG tablet Take 40 mg by mouth daily.     . pravastatin (PRAVACHOL) 20 MG tablet TAKE 1 TABLET EVERY EVENING (Patient taking differently: Take 20 mg by mouth every evening. ) 90 tablet 3  .  Propylene Glycol (SYSTANE BALANCE) 0.6 % SOLN Place 1 drop into both eyes 3 (three) times daily.    [name] amLODipine (NORVASC) 2.5 MG tablet Take 1 tablet (2.5 mg total) by mouth daily. 30 tablet 3  . carvedilol (COREG)  3.125 MG tablet Take 1 tablet (3.125 mg total) by mouth 2 (two) times daily. 60 tablet 3   No current facility-administered medications on file prior to visit.     Cardiovascular studies:  EKG 12/20/2018: Sinus rhythm 100 bpm.  Left atrial enlargement.  Old anterior infarct.     Recent labs: Not available.   Review of Systems  Constitution: Negative for decreased appetite, malaise/fatigue, weight gain and weight loss.  HENT: Negative for congestion.   Eyes: Negative for visual disturbance.  Cardiovascular: Positive for dyspnea on exertion. Negative for chest pain, leg swelling, palpitations and syncope.  Respiratory: Negative for cough.   Endocrine: Negative for cold intolerance.  Hematologic/Lymphatic: Does not bruise/bleed easily.  Skin: Negative for itching and rash.  Musculoskeletal: Negative for myalgias.  Gastrointestinal: Negative for abdominal pain, nausea and vomiting.  Genitourinary: Negative for dysuria.  Neurological: Negative for dizziness and weakness.  Psychiatric/Behavioral: The patient is not nervous/anxious.   All other systems reviewed and are negative.        Vitals:   12/20/18 1451  BP: 139/79  Pulse: (!) 105  SpO2: 96%     Body mass index is 24.61 kg/m. Filed Weights   12/20/18 1451  Weight: 126 lb (57.2 kg)     Objective:   Physical Exam  Constitutional: She is oriented to person, place, and time. She appears well-developed and well-nourished. No distress.  HENT:  Head: Normocephalic and atraumatic.  Eyes: Pupils are equal, round, and reactive to light. Conjunctivae are normal.  Neck: No JVD present.  Cardiovascular: Normal rate, regular rhythm and intact distal pulses.  Pulmonary/Chest: Effort normal and breath sounds  normal. She has no wheezes. She has no rales.  Abdominal: Soft. Bowel sounds are normal. There is no rebound.  Musculoskeletal:        General: No edema.  Lymphadenopathy:    She has no cervical adenopathy.  Neurological: She is alert and oriented to person, place, and time. No cranial nerve deficit.  Skin: Skin is warm and dry.  Psychiatric: She has a normal mood and affect.  Nursing note and vitals reviewed.         Assessment & Recommendations:   78 y.o. Caucasian female with hypertension, hyperlipidemia, CAD s/p RCA PCI in 2006 (Dr. Terrence Dupont), residual nonobstructive disease in LAD, LCx, stress test wiht no ischemia 2011.  1. Coronary artery disease involving native coronary artery of native heart without angina pectoris No angina symptoms. Given her aspirin intolerance, okay to continue plavix.   2. Exertional dyspnea Will obtain echocardiogram.  Will obtain baseline labs from Dr. Baldwin Crown office.    Thank you for referring the patient to [place]. Please feel free to contact with any questions.  Nigel Mormon, MD [facility] Pager: [phone] Office: [phone] If no answer Cell [phone]

## 2018-12-20 NOTE — Progress Notes (Deleted)
Patient referred by [name], MD for ***  Subjective:   [name] Wright, female    DOB: May 07, 1941, 78 y.o.   MRN: [id number]   No chief complaint on file.   *** HPI  78 y.o. Caucasian female with PMH significant for CAD s/p DES to RCA in 2006, April 2011 myoview non ischemic.  HTN, HLD,   *** Past Medical History:  Diagnosis Date  . BCC (basal cell carcinoma of skin) 06/24/1993   Left Shoulder  . 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 Cancer ([place])    skin and eye  . Coronary artery disease    NUCLEAR STRESS TEST, 09/30/2009 - EKG negative for ischemia  . Diabetes mellitus without complication ([employer])    Treated by diet  . Essential tremor    hands, sometimes legs   . Family history of coronary artery disease    Mother died 25  . H/O subconjunctival hemorrhage   . History of blood transfusion   . Hyperlipidemia   . Hypertension   . 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 Myocardial infarction ([place]) 2006  . 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 Retroperitoneal hematoma    S/P  . Shingles   . 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 Superficial nodular basal cell carcinoma (BCC) 03/02/2016   Left Shoulder    *** Past Surgical History:  Procedure Laterality Date  . COLONOSCOPY    . CORONARY ANGIOPLASTY WITH STENT PLACEMENT  11/05/2004   RCA DES  . DILATION AND CURETTAGE OF UTERUS    . LID LESION EXCISION Left 02/14/2018   Procedure: LEFT LID LESION EXCISION;  Surgeon: Clista Bernhardt, MD;  Location: [facility];  Service: Ophthalmology;  Laterality: Left;  . RECONSTRUCTION OF EYELID Left 02/14/2018   Procedure: TOTAL RECONSTRUCTION OF EYELID;  Surgeon: Clista Bernhardt, MD;  Location: [place] [facility];  Service: Ophthalmology;  Laterality: Left;  . SKIN FULL THICKNESS GRAFT Left 02/14/2018   Procedure: SKIN GRAFT FULL THICKNESS LEFT EYE;   Surgeon: Clista Bernhardt, MD;  Location: [facility];  Service: Ophthalmology;  Laterality: Left;  . TONSILECTOMY/ADENOIDECTOMY WITH MYRINGOTOMY    . TUBAL LIGATION      *** Social History   Socioeconomic History  . Marital status: Divorced    Spouse name: Not on file  . Number of children: Not on file  . Years of education: Not on file  . Highest education level: Not on file  Occupational History  . Not on file  Social Needs  . Financial resource strain: Not on file  . Food insecurity    Worry: Not on file    Inability: Not on file  . Transportation needs    Medical: Not on file    Non-medical: Not on file  Tobacco Use  . Smoking status: Never Smoker  . Smokeless tobacco: Never Used  Substance and Sexual Activity  . Alcohol use: No    Alcohol/week: 0.0 standard drinks  . Drug use: No  . Sexual activity: Not on file  Lifestyle  . Physical activity    Days per week: Not on file    Minutes per session: Not on file  . Stress: Not on file  Relationships  . Social Herbalist on phone: Not on file    Gets together:  Not on file    Attends religious service: Not on file    Active member of club or organization: Not on file    Attends meetings of clubs or organizations: Not on file    Relationship status: Not on file  . Intimate partner violence    Fear of current or ex partner: Not on file    Emotionally abused: Not on file    Physically abused: Not on file    Forced sexual activity: Not on file  Other Topics Concern  . Not on file  Social History Narrative  . Not on file    *** Family History  Problem Relation Age of Onset  . Heart attack Mother 66  . Cancer Father        Colon  . Heart attack Maternal Grandmother 61    *** Current Outpatient Medications on File Prior to Visit  Medication Sig Dispense Refill  . amLODipine (NORVASC) 2.5 MG tablet Take 1 tablet (2.5 mg total) by mouth daily. 30 tablet 3  . carvedilol (COREG) 3.125 MG tablet Take 1  tablet (3.125 mg total) by mouth 2 (two) times daily. 60 tablet 3  . clopidogrel (PLAVIX) 75 MG tablet TAKE ONE TABLET BY MOUTH DAILY 90 tablet 2  . Coenzyme Q10 100 MG capsule Take 100 mg by mouth daily.     . Difluprednate (DUREZOL) 0.05 % EMUL Place 1 drop into the right eye daily.     . DULoxetine (CYMBALTA) 30 MG capsule Take 30 mg by mouth daily.    [name] Ertugliflozin L-PyroglutamicAc (STEGLATRO) 5 MG TABS Take 5 mg by mouth daily.    . famotidine (PEPCID AC) 10 MG chewable tablet Chew 10 mg by mouth 2 (two) times daily as needed for heartburn.    . feeding supplement, ENSURE ENLIVE, (ENSURE ENLIVE) LIQD Take 237 mLs by mouth 2 (two) times daily between meals. (Patient taking differently: Take 237 mLs by mouth daily. ) 237 mL 12  . fluticasone (FLONASE) 50 MCG/ACT nasal spray Place 2 sprays into both nostrils daily as needed for allergies or rhinitis.    [name] loratadine (CLARITIN) 10 MG tablet Take 10 mg by mouth every other day.     . metFORMIN (GLUCOPHAGE-XR) 500 MG 24 hr tablet Take 1,500 mg by mouth daily with lunch.    . nitroGLYCERIN (NITROSTAT) 0.4 MG SL tablet Place 1 tablet (0.4 mg total) under the tongue every 5 (five) minutes as needed for chest pain. 25 tablet 3  . pantoprazole (PROTONIX) 40 MG tablet Take 40 mg by mouth daily.     . pravastatin (PRAVACHOL) 20 MG tablet TAKE 1 TABLET EVERY EVENING (Patient taking differently: Take 20 mg by mouth every evening. ) 90 tablet 3  . Propylene Glycol (SYSTANE BALANCE) 0.6 % SOLN Place 1 drop into both eyes 3 (three) times daily.     No current facility-administered medications on file prior to visit.     Cardiovascular studies:  ***  *** Recent labs: CBC    Component Value Date/Time   WBC 10.2 02/14/2018 1316   RBC 4.65 02/14/2018 1316   HGB 13.8 02/14/2018 1316   HCT 42.1 02/14/2018 1316   PLT 305 02/14/2018 1316   MCV 90.5 02/14/2018 1316   MCH 29.7 02/14/2018 1316   MCHC 32.8 02/14/2018 1316   RDW 12.5 02/14/2018 1316    LYMPHSABS 2.2 01/29/2017 1018   MONOABS 0.7 01/29/2017 1018   EOSABS 0.0 01/29/2017 1018   BASOSABS 0.0 01/29/2017 1018  BMP Latest Ref Rng & Units 02/14/2018 02/20/2017 02/19/2017  Glucose 70 - 99 mg/dL 182(H) 178(H) 172(H)  BUN 8 - 23 mg/dL 6(L) 7 7  Creatinine 0.44 - 1.00 mg/dL 0.56 0.49 0.56  Sodium 135 - 145 mmol/L 138 136 132(L)  Potassium 3.5 - 5.1 mmol/L 3.8 4.2 3.5  Chloride 98 - 111 mmol/L 101 109 101  CO2 22 - 32 mmol/L 23 20(L) 22  Calcium 8.9 - 10.3 mg/dL 9.3 8.3(L) 9.4    *** ROS      *** There were no vitals filed for this visit.   There is no height or weight on file to calculate BMI. There were no vitals filed for this visit.  *** Objective:   Physical Exam        Assessment & Recommendations:   ***  ***   Thank you for referring the patient to [place]. Please feel free to contact with any questions.  @[email or web address] @

## 2019-01-02 ENCOUNTER — Other Ambulatory Visit: Payer: Self-pay | Admitting: Cardiovascular Disease

## 2019-01-04 ENCOUNTER — Encounter (HOSPITAL_COMMUNITY): Payer: Self-pay | Admitting: Emergency Medicine

## 2019-01-04 ENCOUNTER — Emergency Department (HOSPITAL_COMMUNITY)
Admission: EM | Admit: 2019-01-04 | Discharge: 2019-01-05 | Disposition: A | Discharge status: Home / Self Care | Payer: Medicare Other | Attending: Emergency Medicine | Admitting: Emergency Medicine

## 2019-01-04 ENCOUNTER — Other Ambulatory Visit: Payer: Self-pay

## 2019-01-04 DIAGNOSIS — I252 Old myocardial infarction: Secondary | ICD-10-CM | POA: Diagnosis not present

## 2019-01-04 DIAGNOSIS — E119 Type 2 diabetes mellitus without complications: Secondary | ICD-10-CM | POA: Diagnosis not present

## 2019-01-04 DIAGNOSIS — Z79899 Other long term (current) drug therapy: Secondary | ICD-10-CM | POA: Diagnosis not present

## 2019-01-04 DIAGNOSIS — N939 Abnormal uterine and vaginal bleeding, unspecified: Secondary | ICD-10-CM | POA: Insufficient documentation

## 2019-01-04 DIAGNOSIS — I2510 Atherosclerotic heart disease of native coronary artery without angina pectoris: Secondary | ICD-10-CM | POA: Diagnosis not present

## 2019-01-04 DIAGNOSIS — Z85828 Personal history of other malignant neoplasm of skin: Secondary | ICD-10-CM | POA: Diagnosis not present

## 2019-01-04 DIAGNOSIS — I10 Essential (primary) hypertension: Secondary | ICD-10-CM | POA: Diagnosis not present

## 2019-01-04 LAB — CBC WITH DIFFERENTIAL/PLATELET
Abs Immature Granulocytes: 0.04 10*3/uL (ref 0.00–0.07)
Basophils Absolute: 0.1 10*3/uL (ref 0.0–0.1)
Basophils Relative: 1 %
Eosinophils Absolute: 0.1 10*3/uL (ref 0.0–0.5)
Eosinophils Relative: 1 %
HCT: 43.4 % (ref 36.0–46.0)
Hemoglobin: 14 g/dL (ref 12.0–15.0)
Immature Granulocytes: 0 %
Lymphocytes Relative: 31 %
Lymphs Abs: 3.3 10*3/uL (ref 0.7–4.0)
MCH: 29.5 pg (ref 26.0–34.0)
MCHC: 32.3 g/dL (ref 30.0–36.0)
MCV: 91.6 fL (ref 80.0–100.0)
Monocytes Absolute: 0.8 10*3/uL (ref 0.1–1.0)
Monocytes Relative: 7 %
Neutro Abs: 6.6 10*3/uL (ref 1.7–7.7)
Neutrophils Relative %: 60 %
Platelets: 331 10*3/uL (ref 150–400)
RBC: 4.74 MIL/uL (ref 3.87–5.11)
RDW: 12.7 % (ref 11.5–15.5)
WBC: 11 10*3/uL — ABNORMAL HIGH (ref 4.0–10.5)
nRBC: 0 % (ref 0.0–0.2)

## 2019-01-04 LAB — COMPREHENSIVE METABOLIC PANEL
ALT: 22 U/L (ref 0–44)
AST: 23 U/L (ref 15–41)
Albumin: 3.9 g/dL (ref 3.5–5.0)
Alkaline Phosphatase: 88 U/L (ref 38–126)
Anion gap: 13 (ref 5–15)
BUN: 11 mg/dL (ref 8–23)
CO2: 22 mmol/L (ref 22–32)
Calcium: 9.1 mg/dL (ref 8.9–10.3)
Chloride: 104 mmol/L (ref 98–111)
Creatinine, Ser: 0.6 mg/dL (ref 0.44–1.00)
GFR calc Af Amer: >60 mL/min (ref >60)
GFR calc non Af Amer: >60 mL/min (ref >60)
Glucose, Bld: 227 mg/dL — ABNORMAL HIGH (ref 70–99)
Potassium: 3.6 mmol/L (ref 3.5–5.1)
Sodium: 139 mmol/L (ref 135–145)
Total Bilirubin: 0.7 mg/dL (ref 0.3–1.2)
Total Protein: 6.7 g/dL (ref 6.5–8.1)

## 2019-01-04 NOTE — ED Triage Notes (Signed)
Patient with vaginal bleeding since yesterday.  Patient saw her OBGYN today and had her pessary taken out and now she is bleeding more than she had been.  No pain.  Patient is on blood thinners but has not taken it for 2-3 days.  She states she takes Plavix.

## 2019-01-04 NOTE — ED Notes (Signed)
[phone] son Taneia Mealor please call when ready

## 2019-01-05 ENCOUNTER — Other Ambulatory Visit: Payer: Self-pay

## 2019-01-05 NOTE — ED Notes (Signed)
Pt discharged with all belongings. Discharge instructions reviewed with pt, and pt verbalized understanding. Opportunity for questions provided.  

## 2019-01-05 NOTE — ED Provider Notes (Signed)
TIME SEEN: 12:51 AM  CHIEF COMPLAINT: Vaginal bleeding  HPI: Patient is a 78 year old female with history of CAD with 1 stent in 2006 still on Plavix who presents to the emergency department with vaginal bleeding.  She states vaginal bleeding started Thursday night July 23.  She went to her OB/GYN Dr. Rogue Bussing at [facility] the next day and had her pessary removed.  States she stopped bleeding but then at 5:30 PM last night after the doctor's appointment she started bleeding heavily and was passing clots.  She states that whenever she stands up she will feel a "gush".  No fevers, chills, nausea, vomiting, diarrhea, abdominal pain.  She has not taken her Plavix in 3 days.  She is not sexually active.  She has not put anything else into her vagina other than tampons.  She has removed her tampon currently.  She is concerned that she is still having uterine prolapse and is asking if another pessary device will be put in today in the ED.  ROS: See HPI Constitutional: no fever  Eyes: no drainage  ENT: no runny nose   Cardiovascular:  no chest pain  Resp: no SOB  GI: no vomiting GU: no dysuria Integumentary: no rash  Allergy: no hives  Musculoskeletal: no leg swelling  Neurological: no slurred speech ROS otherwise negative  PAST MEDICAL HISTORY/PAST SURGICAL HISTORY:  Past Medical History:  Diagnosis Date  . BCC (basal cell carcinoma of skin) 06/24/1993   Left Shoulder  . 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 Cancer ([place])    skin and eye  . Coronary artery disease    NUCLEAR STRESS TEST, 09/30/2009 - EKG negative for ischemia  . Diabetes mellitus without complication ([place])    Treated by diet  . Essential tremor    hands, sometimes legs   . Family history of coronary artery disease    Mother died 63  . H/O subconjunctival hemorrhage   . History of blood transfusion   . Hyperlipidemia   . Hypertension   .  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 Myocardial infarction ([place]) 2006  . 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 Retroperitoneal hematoma    S/P  . Shingles   . 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 Superficial nodular basal cell carcinoma (BCC) 03/02/2016   Left Shoulder    MEDICATIONS:  Prior to Admission medications   Medication Sig Start Date End Date Taking? Authorizing Provider  amLODipine (NORVASC) 2.5 MG tablet TAKE ONE TABLET BY MOUTH DAILY 01/02/19   Patwardhan, Manish J, MD  carvedilol (COREG) 3.125 MG tablet Take 1 tablet (3.125 mg total) by mouth 2 (two) times daily. 08/23/18 11/21/18  Lorretta Harp, MD  clopidogrel (PLAVIX) 75 MG tablet Take 1 tablet (75 mg total) by mouth daily. 12/20/18   Patwardhan, [name], MD  Coenzyme Q10 100 MG capsule Take 100 mg by mouth daily.     [provider]  Difluprednate (DUREZOL) 0.05 % EMUL Place 1 drop into the right eye daily.     [provider]  DULoxetine (CYMBALTA) 30 MG capsule Take 30 mg by mouth daily.    [provider]  Ertugliflozin L-PyroglutamicAc (STEGLATRO) 5 MG TABS Take 5 mg by mouth daily.    [provider]  famotidine (PEPCID AC) 10 MG chewable tablet  Chew 10 mg by mouth 2 (two) times daily as needed for heartburn.    [provider]  feeding supplement, ENSURE ENLIVE, (ENSURE ENLIVE) LIQD Take 237 mLs by mouth 2 (two) times daily between meals. Patient taking differently: Take 237 mLs by mouth daily.  02/21/17   Elodia Florence., MD  fluticasone [facility]) 50 MCG/ACT nasal spray Place 2 sprays into both nostrils daily as needed for allergies or rhinitis.    [provider]  loratadine (CLARITIN) 10 MG tablet Take 10 mg by mouth every other day.     [provider]  metFORMIN (GLUCOPHAGE-XR) 500 MG 24 hr tablet Take 1,500 mg by mouth daily with lunch.    [provider]   nitroGLYCERIN (NITROSTAT) 0.4 MG SL tablet Place 1 tablet (0.4 mg total) under the tongue every 5 (five) minutes as needed for chest pain. 08/09/18   Lorretta Harp, MD  pantoprazole (PROTONIX) 40 MG tablet Take 40 mg by mouth daily.     [provider]  pravastatin (PRAVACHOL) 20 MG tablet TAKE 1 TABLET EVERY EVENING Patient taking differently: Take 20 mg by mouth every evening.  10/26/17   Lorretta Harp, MD  Propylene Glycol (SYSTANE BALANCE) 0.6 % SOLN Place 1 drop into both eyes 3 (three) times daily.    [provider]    ALLERGIES:  Allergies  Allergen Reactions  . Avelox [Moxifloxacin Hcl In Nacl] Anaphylaxis, Nausea Only, Swelling and Other (See Comments)    Throat and tongue swelling  . Altace [Ramipril] Other (See Comments)    Tremors and increased tinnitus  . Hctz [Hydrochlorothiazide] Other (See Comments)    Palpitations and tremors  . Lisinopril Other (See Comments)    Headaches  . Losartan Other (See Comments)    Palpitations and tremors  . Statins Other (See Comments)    Increased blood pressure  . Sulfa Antibiotics Rash    SOCIAL HISTORY:  Social History   Tobacco Use  . Smoking status: Never Smoker  . Smokeless tobacco: Never Used  Substance Use Topics  . Alcohol use: No    Alcohol/week: 0.0 standard drinks    FAMILY HISTORY: Family History  Problem Relation Age of Onset  . Heart attack Mother 44  . Cancer Father        Colon  . Heart attack Maternal Grandmother 61    EXAM: BP 133/81   Pulse (!) 105   Temp 98.5 F (36.9 C) (Oral)   Resp 16   SpO2 97%  CONSTITUTIONAL: Alert and oriented and responds appropriately to questions. Well-appearing; well-nourished, elderly, in no distress HEAD: Normocephalic EYES: Conjunctivae clear, pupils appear equal, EOMI ENT: normal nose; moist mucous membranes NECK: Supple, no meningismus, no nuchal rigidity, no LAD  CARD: Regular and minimally tachycardic; S1 and S2 appreciated; no  murmurs, no clicks, no rubs, no gallops RESP: Normal chest excursion without splinting or tachypnea; breath sounds clear and equal bilaterally; no wheezes, no rhonchi, no rales, no hypoxia or respiratory distress, speaking full sentences ABD/GI: Normal bowel sounds; non-distended; soft, non-tender, no rebound, no guarding, no peritoneal signs, no hepatosplenomegaly GU:  Normal external genitalia. No lesions, rashes noted. Patient has no active vaginal bleeding on exam. No vaginal discharge.  I am unable to view her cervix secondary to redundant vaginal tissue and uterine prolapse.  I have cleaned the vagina multiple times with sterile swabs and there is no active bleeding present.  There is a small amount of blood in the vagina  that was removed.  I have monitored this for several minutes and there is no recurrent bleeding.  She does have friable appearing, irritated vaginal tissue that appears thin.  There is no laceration.  No foreign body present.  No vaginal pain on exam. BACK:  The back appears normal and is non-tender to palpation, there is no CVA tenderness EXT: Normal ROM in all joints; non-tender to palpation; no edema; normal capillary refill; no cyanosis, no calf tenderness or swelling    SKIN: Normal color for age and race; warm; no rash NEURO: Moves all extremities equally PSYCH: The patient's mood and manner are appropriate. Grooming and personal hygiene are appropriate.  MEDICAL DECISION MAKING: Patient here with vaginal bleeding.  I talked to Dr. Caryl Comes on call for [facility].  Appreciate her help.  She has been able to review patient's chart.  She states patient did have an ultrasound that showed a thin endometrial stripe suggesting that the bleeding is coming from her vagina.  Discussed with patient that this is likely due to her recent pessary device and the fact that she is postmenopausal within, friable vaginal tissue and is on Plavix.  I agree that she should continue to  hold her Plavix at this time and have discussed this with her son Karletta Millay who states that she does not take her Plavix regularly anyway.  He states that she only takes it a few times a week.  She is only had 1 stent in 2006 and it is unclear why she is still on Plavix and not on aspirin.  Have advised her to call her cardiologist on Monday to see if she can be transitioned off of Plavix.  Her son is comfortable with this plan as well.  She can follow-up with her OB/GYN on Monday and will call to make an appointment.  She was prescribed topical estrogen which she has not yet filled.  Have advised her to fill this medication and use it only externally.  Have strongly advised her with OB/GYN's recommendations not to put anything else her into her vagina at this time including tampons.  She can use pads for bleeding.  Have recommended that she rest for the next several days.  She seems very concerned about her uterine prolapse and wants another pessary device today.  Explained to her that this is not something we normally do from the ER and she is not prolapsing at all outside of her vagina and that this is something she can discuss with her OB/GYN on Monday.  Discussed that if we place another pessary device today that we could irritate the tissue again which could cause bleeding to start again.  She verbalized understanding.  She is hemodynamically stable here.  Her hemoglobin is 14 and her platelets are 331,000.  I feel she is safe to be discharged.  Discussed plan of care with patient and her son by phone.  They verbalized understanding.  Patient to be discharged home with bleeding return precautions discussed.  She will follow-up with her OB/GYN and cardiologist in 2 days.   At this time, I do not feel there is any life-threatening condition present. I have reviewed and discussed all results (EKG, imaging, lab, urine as appropriate) and exam findings with patient/family. I have reviewed nursing notes and  appropriate previous records.  I feel the patient is safe to be discharged home without further emergent workup and can continue workup as an outpatient as needed. Discussed usual and customary return  precautions. Patient/family verbalize understanding and are comfortable with this plan.  Outpatient follow-up has been provided as needed. All questions have been answered.      Chanae Gemma, Delice Bison, DO 01/05/19 [phone]

## 2019-01-05 NOTE — Discharge Instructions (Addendum)
Please do not put anything into your vagina.  You may use the topical estrogen that was prescribed by your OB/GYN on the outside of your vagina.  Please do not use tampons at this time.  You may use pads for vaginal bleeding.  If you begin to bleed so heavily that you are soaking through a pad every hour for more than 2 straight hours, have chest pain or shortness of breath, feel like you are going to pass out, please return the emergency department.  I feel it is reasonable to hold your Plavix at this time.  I recommend you to call your cardiologist on Monday to see if this is a medication you should continue or if you could be transitioned to aspirin daily.  Please also call your OB/GYN on-call to schedule follow-up Monday morning.  Your hemoglobin was 14 today.  You do not need a blood transfusion.

## 2019-01-09 ENCOUNTER — Telehealth: Payer: Self-pay

## 2019-01-09 NOTE — Telephone Encounter (Signed)
She is on plavix due to aspirin intolerance. No recent stents. Okay to hold plavix. She should follow up with her OBGYN and resume plavix, if and when deemed safe by then.   Thanks MJP

## 2019-01-09 NOTE — Telephone Encounter (Signed)
Telephone encounter:  Reason for call: PLAVIX  Usual provider: MP  Last office visit: 12/20/18  Next office visit: 06/27/19   Last hospitalization: 01/04/19   She has f/u w/ Gyn tomorrow and she will let us know if ok to start aspirin 81 again. ahe will stop Plavix.    Current Outpatient Medications on File Prior to Visit  Medication Sig Dispense Refill  . amLODipine (NORVASC) 2.5 MG tablet TAKE ONE TABLET BY MOUTH DAILY 90 tablet 2  . carvedilol (COREG) 3.125 MG tablet Take 1 tablet (3.125 mg total) by mouth 2 (two) times daily. 60 tablet 3  . clopidogrel (PLAVIX) 75 MG tablet Take 1 tablet (75 mg total) by mouth daily. 90 tablet 3  . Coenzyme Q10 100 MG capsule Take 100 mg by mouth daily.     . Difluprednate (DUREZOL) 0.05 % EMUL Place 1 drop into the right eye daily.     . DULoxetine (CYMBALTA) 30 MG capsule Take 30 mg by mouth daily.    [name] Ertugliflozin L-PyroglutamicAc (STEGLATRO) 5 MG TABS Take 5 mg by mouth daily.    . famotidine (PEPCID AC) 10 MG chewable tablet Chew 10 mg by mouth 2 (two) times daily as needed for heartburn.    . feeding supplement, ENSURE ENLIVE, (ENSURE ENLIVE) LIQD Take 237 mLs by mouth 2 (two) times daily between meals. (Patient taking differently: Take 237 mLs by mouth daily. ) 237 mL 12  . fluticasone (FLONASE) 50 MCG/ACT nasal spray Place 2 sprays into both nostrils daily as needed for allergies or rhinitis.    [name] loratadine (CLARITIN) 10 MG tablet Take 10 mg by mouth every other day.     . metFORMIN (GLUCOPHAGE-XR) 500 MG 24 hr tablet Take 1,500 mg by mouth daily with lunch.    . nitroGLYCERIN (NITROSTAT) 0.4 MG SL tablet Place 1 tablet (0.4 mg total) under the tongue every 5 (five) minutes as needed for chest pain. 25 tablet 3  . pantoprazole (PROTONIX) 40 MG tablet Take 40 mg by mouth daily.     . pravastatin (PRAVACHOL) 20 MG tablet TAKE 1 TABLET EVERY EVENING (Patient taking differently: Take 20 mg by mouth every evening. ) 90 tablet 3  . Propylene  Glycol (SYSTANE BALANCE) 0.6 % SOLN Place 1 drop into both eyes 3 (three) times daily.     No current facility-administered medications on file prior to visit.

## 2019-01-09 NOTE — Telephone Encounter (Signed)
Okay to stop plavix and start Aspirin 81 mg when okayed by OBGYN.  Thanks MJP

## 2019-01-09 NOTE — Telephone Encounter (Signed)
Telephone encounter:  Reason for call: Vaginal Bleeding/Plavix  Pt was in ER for Vaginal Bleeding, was tod Plavix was the cause and to stop it, please advise.  Usual provider: mp  Last office visit: 12/20/18 Next office visit: 1/18//21   Last hospitalization:  01/04/19   Current Outpatient Medications on File Prior to Visit  Medication Sig Dispense Refill  . amLODipine (NORVASC) 2.5 MG tablet TAKE ONE TABLET BY MOUTH DAILY 90 tablet 2  . carvedilol (COREG) 3.125 MG tablet Take 1 tablet (3.125 mg total) by mouth 2 (two) times daily. 60 tablet 3  . clopidogrel (PLAVIX) 75 MG tablet Take 1 tablet (75 mg total) by mouth daily. 90 tablet 3  . Coenzyme Q10 100 MG capsule Take 100 mg by mouth daily.     . Difluprednate (DUREZOL) 0.05 % EMUL Place 1 drop into the right eye daily.     . DULoxetine (CYMBALTA) 30 MG capsule Take 30 mg by mouth daily.    [name] Ertugliflozin L-PyroglutamicAc (STEGLATRO) 5 MG TABS Take 5 mg by mouth daily.    . famotidine (PEPCID AC) 10 MG chewable tablet Chew 10 mg by mouth 2 (two) times daily as needed for heartburn.    . feeding supplement, ENSURE ENLIVE, (ENSURE ENLIVE) LIQD Take 237 mLs by mouth 2 (two) times daily between meals. (Patient taking differently: Take 237 mLs by mouth daily. ) 237 mL 12  . fluticasone (FLONASE) 50 MCG/ACT nasal spray Place 2 sprays into both nostrils daily as needed for allergies or rhinitis.    [name] loratadine (CLARITIN) 10 MG tablet Take 10 mg by mouth every other day.     . metFORMIN (GLUCOPHAGE-XR) 500 MG 24 hr tablet Take 1,500 mg by mouth daily with lunch.    . nitroGLYCERIN (NITROSTAT) 0.4 MG SL tablet Place 1 tablet (0.4 mg total) under the tongue every 5 (five) minutes as needed for chest pain. 25 tablet 3  . pantoprazole (PROTONIX) 40 MG tablet Take 40 mg by mouth daily.     . pravastatin (PRAVACHOL) 20 MG tablet TAKE 1 TABLET EVERY EVENING (Patient taking differently: Take 20 mg by mouth every evening. ) 90 tablet 3  . Propylene  Glycol (SYSTANE BALANCE) 0.6 % SOLN Place 1 drop into both eyes 3 (three) times daily.     No current facility-administered medications on file prior to visit.

## 2019-01-10 DIAGNOSIS — N950 Postmenopausal bleeding: Secondary | ICD-10-CM | POA: Diagnosis not present

## 2019-01-10 DIAGNOSIS — N814 Uterovaginal prolapse, unspecified: Secondary | ICD-10-CM | POA: Diagnosis not present

## 2019-01-16 ENCOUNTER — Other Ambulatory Visit: Payer: TRICARE For Life (TFL)

## 2019-03-05 DIAGNOSIS — N814 Uterovaginal prolapse, unspecified: Secondary | ICD-10-CM | POA: Diagnosis not present

## 2019-04-08 DIAGNOSIS — E119 Type 2 diabetes mellitus without complications: Secondary | ICD-10-CM | POA: Diagnosis not present

## 2019-04-11 DIAGNOSIS — M8580 Other specified disorders of bone density and structure, unspecified site: Secondary | ICD-10-CM | POA: Diagnosis not present

## 2019-04-11 DIAGNOSIS — I2510 Atherosclerotic heart disease of native coronary artery without angina pectoris: Secondary | ICD-10-CM | POA: Diagnosis not present

## 2019-04-11 DIAGNOSIS — R634 Abnormal weight loss: Secondary | ICD-10-CM | POA: Diagnosis not present

## 2019-04-11 DIAGNOSIS — I10 Essential (primary) hypertension: Secondary | ICD-10-CM | POA: Diagnosis not present

## 2019-04-11 DIAGNOSIS — E871 Hypo-osmolality and hyponatremia: Secondary | ICD-10-CM | POA: Diagnosis not present

## 2019-04-11 DIAGNOSIS — E785 Hyperlipidemia, unspecified: Secondary | ICD-10-CM | POA: Diagnosis not present

## 2019-04-11 DIAGNOSIS — N814 Uterovaginal prolapse, unspecified: Secondary | ICD-10-CM | POA: Diagnosis not present

## 2019-04-11 DIAGNOSIS — E1159 Type 2 diabetes mellitus with other circulatory complications: Secondary | ICD-10-CM | POA: Diagnosis not present

## 2019-04-11 DIAGNOSIS — K219 Gastro-esophageal reflux disease without esophagitis: Secondary | ICD-10-CM | POA: Diagnosis not present

## 2019-04-11 DIAGNOSIS — F329 Major depressive disorder, single episode, unspecified: Secondary | ICD-10-CM | POA: Diagnosis not present

## 2019-04-15 DIAGNOSIS — M859 Disorder of bone density and structure, unspecified: Secondary | ICD-10-CM | POA: Diagnosis not present

## 2019-04-15 DIAGNOSIS — R634 Abnormal weight loss: Secondary | ICD-10-CM | POA: Diagnosis not present

## 2019-04-15 DIAGNOSIS — E1159 Type 2 diabetes mellitus with other circulatory complications: Secondary | ICD-10-CM | POA: Diagnosis not present

## 2019-04-15 DIAGNOSIS — I10 Essential (primary) hypertension: Secondary | ICD-10-CM | POA: Diagnosis not present

## 2019-04-15 DIAGNOSIS — E7849 Other hyperlipidemia: Secondary | ICD-10-CM | POA: Diagnosis not present

## 2019-04-15 DIAGNOSIS — Z23 Encounter for immunization: Secondary | ICD-10-CM | POA: Diagnosis not present

## 2019-04-15 LAB — VITAMIN D 25 HYDROXY (VIT D DEFICIENCY, FRACTURES): Vit D, 25-Hydroxy: 51.1

## 2019-04-15 LAB — HEPATIC FUNCTION PANEL
ALT: 18 (ref 7–35)
AST: 14 (ref 13–35)
Alkaline Phosphatase: 104 (ref 25–125)
Bilirubin, Direct: 0.2 (ref 0.01–0.4)
Bilirubin, Total: 0.6

## 2019-04-15 LAB — LIPID PANEL
Cholesterol: 199 (ref 0–200)
HDL: 60 (ref 35–70)
LDL Cholesterol: 102
Triglycerides: 186 — AB (ref 40–160)

## 2019-04-15 LAB — BASIC METABOLIC PANEL
BUN: 11 (ref 4–21)
CO2: 21 (ref 13–22)
Chloride: 102 (ref 99–108)
Creatinine: 0.8 (ref 0.5–1.1)
Glucose: 297
Potassium: 4.7 (ref 3.4–5.3)
Sodium: 135 — AB (ref 137–147)

## 2019-04-15 LAB — CBC: RBC: 5.1 (ref 3.87–5.11)

## 2019-04-15 LAB — COMPREHENSIVE METABOLIC PANEL
Albumin: 3.6 (ref 3.5–5.0)
Calcium: 9.3 (ref 8.7–10.7)
GFR calc Af Amer: 83.7
GFR calc non Af Amer: 69.2

## 2019-04-15 LAB — CBC AND DIFFERENTIAL
HCT: 45 (ref 36–46)
Hemoglobin: 14.3 (ref 12.0–16.0)
Platelets: 273 (ref 150–399)
WBC: 8.7

## 2019-04-15 LAB — TSH: TSH: 1.64 (ref 0.41–5.90)

## 2019-04-15 LAB — HEMOGLOBIN A1C: Hemoglobin A1C: 8.3

## 2019-04-23 DIAGNOSIS — N814 Uterovaginal prolapse, unspecified: Secondary | ICD-10-CM | POA: Diagnosis not present

## 2019-04-30 ENCOUNTER — Other Ambulatory Visit: Payer: Self-pay

## 2019-04-30 ENCOUNTER — Ambulatory Visit (INDEPENDENT_AMBULATORY_CARE_PROVIDER_SITE_OTHER): Payer: Medicare Other

## 2019-04-30 DIAGNOSIS — R0600 Dyspnea, unspecified: Secondary | ICD-10-CM

## 2019-04-30 DIAGNOSIS — R0609 Other forms of dyspnea: Secondary | ICD-10-CM

## 2019-05-06 ENCOUNTER — Other Ambulatory Visit: Payer: Self-pay

## 2019-05-06 DIAGNOSIS — E7800 Pure hypercholesterolemia, unspecified: Secondary | ICD-10-CM

## 2019-05-06 MED ORDER — PRAVASTATIN SODIUM 20 MG PO TABS: 20.0000 mg | ORAL_TABLET | Freq: Every evening | ORAL | 3 refills | Status: AC

## 2019-05-06 NOTE — Progress Notes (Signed)
LVM with details.

## 2019-06-27 ENCOUNTER — Ambulatory Visit (INDEPENDENT_AMBULATORY_CARE_PROVIDER_SITE_OTHER): Payer: Medicare Other | Admitting: Cardiology

## 2019-06-27 ENCOUNTER — Encounter: Payer: Self-pay | Admitting: Cardiology

## 2019-06-27 ENCOUNTER — Other Ambulatory Visit: Payer: Self-pay

## 2019-06-27 VITALS — BP 143/72 | HR 100 | Ht 60.0 in | Wt 117.7 lb

## 2019-06-27 DIAGNOSIS — I10 Essential (primary) hypertension: Secondary | ICD-10-CM

## 2019-06-27 DIAGNOSIS — I2510 Atherosclerotic heart disease of native coronary artery without angina pectoris: Secondary | ICD-10-CM

## 2019-06-27 DIAGNOSIS — G8929 Other chronic pain: Secondary | ICD-10-CM | POA: Diagnosis not present

## 2019-06-27 DIAGNOSIS — M545 Low back pain: Secondary | ICD-10-CM | POA: Diagnosis not present

## 2019-06-27 DIAGNOSIS — Z789 Other specified health status: Secondary | ICD-10-CM | POA: Insufficient documentation

## 2019-06-27 NOTE — Progress Notes (Signed)
Patient referred by [name], MD for coronary artery disease  Subjective:   [name] Wright, female    DOB: 1941-01-13, 79 y.o.   MRN: [id number]   Chief Complaint  Patient presents with  . Coronary Artery Disease  . Hypertension  . Follow-up    50mo     HPI  79 y.o. Caucasian female with hypertension, hyperlipidemia, CAD s/p RCA PCI in 2006 (Dr. Terrence Dupont), residual nonobstructive disease in LAD, LCx, stress test with no ischemia 2011.  She denies chest pain, shortness of breath, palpitations, leg edema, orthopnea, PND, TIA/syncope. Her biggest complaint today is back pain. She reports weakness in her thighs, loss of muscle mass, as well as tingling and numbness in both her feet.     Current Outpatient Medications on File Prior to Visit  Medication Sig Dispense Refill  . amLODipine (NORVASC) 2.5 MG tablet TAKE ONE TABLET BY MOUTH DAILY 90 tablet 2  . Aspirin Buf,CaCarb-MgCarb-MgO, 81 MG TABS daily.    . Coenzyme Q10 100 MG capsule Take 100 mg by mouth daily.     . Difluprednate (DUREZOL) 0.05 % EMUL Place 1 drop into the right eye daily.     . DULoxetine (CYMBALTA) 30 MG capsule Take 30 mg by mouth daily.    [name] Ertugliflozin L-PyroglutamicAc (STEGLATRO) 5 MG TABS Take 5 mg by mouth daily.    [name] estradiol (ESTRACE VAGINAL) 0.1 MG/GM vaginal cream as needed.    . famotidine (PEPCID AC) 10 MG chewable tablet Chew 10 mg by mouth 2 (two) times daily as needed for heartburn.    . fluticasone (FLONASE) 50 MCG/ACT nasal spray Place 2 sprays into both nostrils daily as needed for allergies or rhinitis.    [name] lactose free nutrition (BOOST) LIQD Take 237 mLs by mouth 2 (two) times daily between meals.    [name] loratadine (CLARITIN) 10 MG tablet Take 10 mg by mouth every other day.     . metFORMIN (GLUCOPHAGE-XR) 500 MG 24 hr tablet Take 500 mg by mouth daily with lunch.     . nitroGLYCERIN (NITROSTAT) 0.4 MG SL tablet Place 1 tablet (0.4 mg total) under the tongue every 5 (five) minutes  as needed for chest pain. 25 tablet 3  . pantoprazole (PROTONIX) 40 MG tablet Take 40 mg by mouth daily.     . pravastatin (PRAVACHOL) 20 MG tablet Take 1 tablet (20 mg total) by mouth every evening. 90 tablet 3  . Propylene Glycol (SYSTANE BALANCE) 0.6 % SOLN Place 1 drop into both eyes 3 (three) times daily.     No current facility-administered medications on file prior to visit.    Cardiovascular studies:  EKG 06/27/2019: Sinus rhythm 98 bpm. Normal EKG.  Echocardiogram 04/30/2019: Left ventricle cavity is normal in size. Normal LV systolic function with visual EF 50-55%. Moderate concentric hypertrophy of the left ventricle. Normal global wall motion. Indeterminate diastolic filling pattern.  Left atrial cavity is mildly dilated. Moderate (Grade II) mitral regurgitation. Mild tricuspid regurgitation.  No evidence of pulmonary hypertension.  EKG 12/20/2018: Sinus rhythm 100 bpm.  Left atrial enlargement.  Old anterior infarct.     Recent labs: Not available.   Review of Systems  Cardiovascular: Negative for chest pain, dyspnea on exertion, leg swelling, palpitations and syncope.  Musculoskeletal: Positive for back pain and muscle weakness.        Vitals:   06/27/19 1422 06/27/19 1441  BP: (!) 166/77 (!) 143/72  Pulse: (!) 103 100  SpO2: 95%  Body mass index is 22.99 kg/m. Filed Weights   06/27/19 1422  Weight: 117 lb 11.2 oz (53.4 kg)     Objective:   Physical Exam  Constitutional: She appears well-developed and well-nourished.  Neck: No JVD present.  Cardiovascular: Normal rate, regular rhythm, normal heart sounds and intact distal pulses.  No murmur heard. Pulmonary/Chest: Effort normal and breath sounds normal. She has no wheezes. She has no rales.  Musculoskeletal:        General: No edema.  Neurological:  Decreased dorsiflextion of L>R toe  Nursing note and vitals reviewed.         Assessment & Recommendations:   79 y.o. Caucasian  female with hypertension, hyperlipidemia, CAD s/p RCA PCI in 2006 (Dr. Terrence Dupont), residual nonobstructive disease in LAD, LCx, stress test with no ischemia 2011.  1. Coronary artery disease involving native coronary artery of native heart without angina pectoris No angina symptoms. Continue Aspirin, pravastatin.  2. Hypertension: Elevated on first check, subsequently improved.   3. Back pain: I am concerned she may have spinal stenosis, with possibly motor and sensory symptoms. Consider further workup and management.   Nigel Mormon, MD [facility] Pager: [phone] Office: [phone] If no answer Cell [phone]

## 2019-07-03 ENCOUNTER — Other Ambulatory Visit: Payer: Self-pay

## 2019-07-03 ENCOUNTER — Encounter: Payer: Self-pay | Admitting: Physical Therapy

## 2019-07-03 ENCOUNTER — Ambulatory Visit: Payer: Medicare Other | Attending: Family Medicine | Admitting: Physical Therapy

## 2019-07-03 DIAGNOSIS — G8929 Other chronic pain: Secondary | ICD-10-CM | POA: Diagnosis not present

## 2019-07-03 DIAGNOSIS — M542 Cervicalgia: Secondary | ICD-10-CM | POA: Diagnosis not present

## 2019-07-03 DIAGNOSIS — M6281 Muscle weakness (generalized): Secondary | ICD-10-CM | POA: Diagnosis not present

## 2019-07-03 DIAGNOSIS — M545 Low back pain: Secondary | ICD-10-CM | POA: Insufficient documentation

## 2019-07-03 DIAGNOSIS — R2689 Other abnormalities of gait and mobility: Secondary | ICD-10-CM | POA: Insufficient documentation

## 2019-07-03 NOTE — Therapy (Signed)
[place], [place], [address] Phone: [phone]   Fax:  [phone]  Physical Therapy Evaluation  Patient Details  Name: [name] Wright MRN: [id number] Date of Birth: 1941/01/30 Referring Provider (PT): Geoffery Lyons, NP   Encounter Date: 07/03/2019  PT End of Session - 07/03/19 1411    Visit Number  1    Number of Visits  8    Date for PT Re-Evaluation  08/28/19    Authorization Type  MCR A&B    PT Start Time  1400    PT Stop Time  1445    PT Time Calculation (min)  45 min    Activity Tolerance  Patient tolerated treatment well    Behavior During Therapy  [facility] for tasks assessed/performed       Past Medical History:  Diagnosis Date  . BCC (basal cell carcinoma of skin) 06/24/1993   Left Shoulder  . 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 Cancer ([place])    skin and eye  . Coronary artery disease    NUCLEAR STRESS TEST, 09/30/2009 - EKG negative for ischemia  . Diabetes mellitus without complication ([place])    Treated by diet  . Essential tremor    hands, sometimes legs   . Family history of coronary artery disease    Mother died 64  . H/O subconjunctival hemorrhage   . History of blood transfusion   . Hyperlipidemia   . Hypertension   . 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 Myocardial infarction ([place]) 2006  . 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 Retroperitoneal hematoma    S/P  . Shingles   . 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 Superficial nodular basal cell carcinoma (BCC) 03/02/2016   Left Shoulder    Past Surgical History:  Procedure Laterality Date  . COLONOSCOPY    . CORONARY ANGIOPLASTY WITH STENT PLACEMENT  11/05/2004   RCA DES  . DILATION AND CURETTAGE OF UTERUS    . LID LESION EXCISION Left 02/14/2018   Procedure: LEFT LID LESION  EXCISION;  Surgeon: Clista Bernhardt, MD;  Location: [place];  Service: Ophthalmology;  Laterality: Left;  . RECONSTRUCTION OF EYELID Left 02/14/2018   Procedure: TOTAL RECONSTRUCTION OF EYELID;  Surgeon: Clista Bernhardt, MD;  Location: [place];  Service: Ophthalmology;  Laterality: Left;  . SKIN FULL THICKNESS GRAFT Left 02/14/2018   Procedure: SKIN GRAFT FULL THICKNESS LEFT EYE;  Surgeon: Clista Bernhardt, MD;  Location: [place];  Service: Ophthalmology;  Laterality: Left;  . TONSILECTOMY/ADENOIDECTOMY WITH MYRINGOTOMY    . TUBAL LIGATION      There were no vitals filed for this visit.   Subjective Assessment - 07/03/19 1400    Subjective  Patient reportes that lower back pain started back during the summer. Whenever she has to walk or move around she has to bend over due to the pain and she has to use her hands to get herself back up. She is still able to walk around the house alright but she has trouble walking the in the community, when she is at the grocery store she is able to use the grocery cart to help her. Her neck was also bothering her but this has improved. Overall, she feels like she has lost a lot of muscle  over the past few months and she feels this has caused increased back pain.    Pertinent History  CAD, HTN    Limitations  Lifting;Standing;Walking;House hold activities    How long can you sit comfortably?  No limitation    How long can you stand comfortably?  Less than 5 minutes    How long can you walk comfortably?  Less than 5 minutes    Patient Stated Goals  Be able to walk better.    Currently in Pain?  Yes    Pain Score  4     Pain Location  Back    Pain Orientation  Lower;Right;Left    Pain Descriptors / Indicators  Aching;Tightness;Sore;Sharp;Cramping    Pain Type  Chronic pain    Pain Onset  More than a month ago    Pain Frequency  Intermittent    Aggravating Factors   Standing, walking    Pain Relieving Factors  Rest    Multiple Pain Sites  Yes    Pain Score   0    Pain Location  Neck    Pain Orientation  Right    Pain Type  Chronic pain    Pain Onset  More than a month ago    Pain Frequency  Intermittent    Aggravating Factors   Looking over shoulder or lifting objects    Pain Relieving Factors  Rest         [facility] PT Assessment - 07/03/19 0001      Assessment   Medical Diagnosis  Chronic neck and lumbar pain    Referring Provider (PT)  Geoffery Lyons, NP    Onset Date/Surgical Date  --   patient reports started worsening over the summer   Hand Dominance  Right    Next MD Visit  Not scheduled    Prior Therapy  Yes      Precautions   Precautions  None      Restrictions   Weight Bearing Restrictions  No      Balance Screen   Has the patient fallen in the past 6 months  No    Has the patient had a decrease in activity level because of a fear of falling?   No    Is the patient reluctant to leave their home because of a fear of falling?   No      Home Environment   Living Environment  Private residence    Living Arrangements  Children    Type of [place]  Two level      Prior Function   Level of Independence  Independent with basic ADLs    Vocation  Retired    Leisure  Patient used to go to the [employer] consistently      Cognition   Overall Cognitive Status  Within Functional Limits for tasks assessed      Observation/Other Assessments   Observations  Pa[place] appears in no apparent distress    Focus on Therapeutic Outcomes (FOTO)   65% limitation      Sensation   Light Touch  Appears Intact      Posture/Postural Control   Posture Comments  Patient exhibits rounded shoulder and forward head posture, increased thoracic kyphosis decreased lumbar lordosis      ROM / Strength   AROM / PROM / Strength  AROM;Strength      AROM   Overall AROM Comments  All shoulder AROM [facility]  AROM Assessment Site  Cervical;Lumbar    Cervical Flexion  40    Cervical Extension  25    Cervical - Right Side Bend  15     Cervical - Left Side Bend  15    Cervical - Right Rotation  50    Cervical - Left Rotation  50    Lumbar Flexion  WNL    Lumbar Extension  50%    Lumbar - Right Side Bend  WNL    Lumbar - Left Side Bend  WNL    Lumbar - Right Rotation  75%    Lumbar - Left Rotation  75%      Strength   Overall Strength Comments  Periscapular strength grossly 4-/5 MMT bilat    Strength Assessment Site  Knee;Hip    Right/Left Hip  Right;Left    Right Hip Flexion  4-/5    Right Hip Extension  3+/5    Right Hip ABduction  3/5    Left Hip Flexion  4-/5    Left Hip Extension  3+/5    Left Hip ABduction  3/5    Right/Left Knee  Right;Left    Right Knee Flexion  4-/5    Right Knee Extension  4/5    Left Knee Flexion  4-/5    Left Knee Extension  4/5      Flexibility   Soft Tissue Assessment /Muscle Length  yes    Hamstrings  limited bilat    Piriformis  limited bilat      Palpation   Palpation comment  Non TTP      Special Tests   Other special tests  Radicular testing negative both cervical and lumbar      Transfers   Transfers  Independent with all Transfers      Ambulation/Gait   Ambulation/Gait  Yes    Ambulation/Gait Assistance  6: Modified independent (Device/Increase time)    Gait Comments  Decreased gait speed, slight forward trunk lean, trendelenburg bilaterally                Objective measurements completed on examination: See above findings.      [facility] Adult PT Treatment/Exercise - 07/03/19 0001      Exercises   Exercises  Lumbar;Neck      Neck Exercises: Theraband   Shoulder Extension  10 reps    Shoulder Extension Limitations  yellow    Rows  10 reps    Rows Limitations  yellow      Lumbar Exercises: Stretches   Single Knee to Chest Stretch  30 seconds    Lower Trunk Rotation  5 reps;10 seconds    Piriformis Stretch  30 seconds      Lumbar Exercises: Seated   Sit to Stand  10 reps      Lumbar Exercises: Supine   Bridge  10 reps    Straight Leg  Raise  10 reps      Lumbar Exercises: Sidelying   Clam  10 reps             PT Education - 07/03/19 1410    Education Details  Exam findings, POC, HEP, walking short distance more frequently during the day    Person(s) Educated  Patient    Methods  Explanation;Demonstration;Tactile cues;Verbal cues;Handout    Comprehension  Verbalized understanding;Returned demonstration;Verbal cues required;Tactile cues required;Need further instruction       PT Short Term Goals - 07/03/19 1545      PT SHORT  TERM GOAL #1   Title  Patient will be I with initial HEP to progress in PT.    Time  4    Period  Weeks    Status  New    Target Date  07/31/19      PT SHORT TERM GOAL #2   Title  Patient will be able be able to perform dressing and grooming tasks with minimal difficulty.    Time  4    Period  Weeks    Status  New    Target Date  07/31/19      PT SHORT TERM GOAL #3   Title  Patient will be able to walk >/= 10 minutes with minimal difficulty to allow for improved community access.    Time  4    Period  Weeks    Status  New    Target Date  07/31/19        PT Long Term Goals - 07/03/19 1552      PT LONG TERM GOAL #1   Title  Patient will be I in final HEP to maintain progress in PT.    Time  8    Period  Weeks    Status  New    Target Date  08/28/19      PT LONG TERM GOAL #2   Title  Patient will exhibit imporved BLE strength of grossly >/= 4/5 MMT to improve ability to go up/down stairs    Time  8    Period  Weeks    Status  New    Target Date  08/28/19      PT LONG TERM GOAL #3   Title  Patient will report minimal discomfort with walking >/= 15 minutes to allow for ability to go to grocery store with less limitation.    Time  8    Period  Weeks    Status  New    Target Date  08/28/19      PT LONG TERM GOAL #4   Title  Patient will report improved functional level to </= 49% limitation via FOTO.    Time  8    Period  Weeks    Status  New    Target Date   08/28/19             Plan - 07/03/19 1411    Clinical Impression Statement  Patient presents to PT with report of chronic lower back and neck pain that limit her activity level. She exhibits impaired motion and strength with poor posture that are most likely contributing to her symptoms. Her lower back pain does present similarly to stenosis as she has a flexed positioning with walking or standing, but she did not present with any peripheral symptoms this visit. She would benefit from continued skilled PT to progress her mobility and strength to allow for improved walking and activity.    Personal Factors and Comorbidities  Age;Comorbidity 2;Time since onset of injury/illness/exacerbation    Comorbidities  CAD, HTN    Examination-Activity Limitations  Bend;Carry;Lift;Stand;Locomotion Level;Transfers;Hygiene/Grooming    Examination-Participation Restrictions  Community Activity;Cleaning;Meal Prep;[place]ita [place]    Stability/Clinical Decision Making  Stable/Uncomplicated    Clinical Decision Making  Low    Rehab Potential  Good    PT Frequency  1x / week    PT Duration  8 weeks    PT Treatment/Interventions  ADLs/Self Care Home Management;Cryotherapy;Electrical Stimulation;Moist Heat;Gait training;Functional mobility training;Therapeutic activities;Therapeutic exercise;Balance training;Neuromuscular re-education;Manual techniques;Patient/family education;Passive range of motion;Dry  needling;Joint Manipulations;Spinal Manipulations;Taping    PT Next Visit Plan  Assess HEP and progress PRN, generalized LE and core strengthening, periscapualr and postural strengthening    PT Home Exercise Plan  [id number]: Supine SKTC and piriformis stretch, SLR, LTR, bridge, side lying clamshell, sit-to-stand, banded row and extension with yellow    Consulted and Agree with Plan of Care  Patient       Patient will benefit from skilled therapeutic intervention in order to improve the following deficits and  impairments:  Abnormal gait, Decreased range of motion, Decreased activity tolerance, Pain, Postural dysfunction, Decreased strength  Visit Diagnosis: Cervicalgia  Chronic bilateral low back pain, unspecified whether sciatica present  Muscle weakness (generalized)  Other abnormalities of gait and mobility     Problem List Patient Active Problem List   Diagnosis Date Noted  . Aspirin intolerance 06/27/2019  . Failure to thrive (0-17) 02/19/2017  . Shingles right face and eye 02/19/2017  . CAD- RCA DES 2006   . Dyslipidemia, goal LDL below 70   . Essential hypertension   . Non-insulin treated type 2 diabetes mellitus ([place])     Hilda Blades, PT, DPT, LAT, ATC 07/03/19  4:00 PM Phone: [phone] Fax: [facility] [address] [place], [place], [address] Phone: [phone]   Fax:  [phone]  Name: [name] Wright MRN: [id number] Date of Birth: [birth year]

## 2019-07-03 NOTE — Patient Instructions (Signed)
Access Code: [email or web address]  URL: [email or web address]  Date: 07/03/2019  Prepared by: Hilda Blades   Exercises Hooklying Single Knee to Chest Stretch - 3 reps - 20 seconds hold - 1x daily - 7x weekly Supine Piriformis Stretch with Foot on Ground - 3 reps - 30 seconds hold - 1x daily - 7x weekly Straight Leg Raise - 10 reps - 2 sets - 1x daily - 7x weekly Bridge - 10 reps - 2 sets - 1x daily - 7x weekly Supine Lower Trunk Rotation - 10 reps - 5 seconds hold - 1x daily - 7x weekly Clamshell - 10 reps - 2 sets - 1x daily - 7x weekly Sit to Stand without Arm Support - 10 reps - 2 sets - 1x daily - 7x weekly Standing Row with Anchored Resistance - 10 reps - 2 sets - 1x daily - 7x weekly Shoulder Extension with Band - 10 reps - 2 sets - 1x daily - 7x weekly

## 2019-07-12 ENCOUNTER — Other Ambulatory Visit: Payer: Self-pay

## 2019-07-12 MED ORDER — AMLODIPINE BESYLATE 2.5 MG PO TABS: 2.5000 mg | ORAL_TABLET | Freq: Every day | ORAL | 3 refills | Status: AC

## 2019-07-17 ENCOUNTER — Encounter: Payer: Self-pay | Admitting: Physical Therapy

## 2019-07-17 ENCOUNTER — Other Ambulatory Visit: Payer: Self-pay

## 2019-07-17 ENCOUNTER — Ambulatory Visit: Payer: Medicare Other | Attending: Family Medicine | Admitting: Physical Therapy

## 2019-07-17 DIAGNOSIS — M542 Cervicalgia: Secondary | ICD-10-CM | POA: Insufficient documentation

## 2019-07-17 DIAGNOSIS — R2689 Other abnormalities of gait and mobility: Secondary | ICD-10-CM | POA: Insufficient documentation

## 2019-07-17 DIAGNOSIS — M6281 Muscle weakness (generalized): Secondary | ICD-10-CM

## 2019-07-17 DIAGNOSIS — M545 Low back pain, unspecified: Secondary | ICD-10-CM

## 2019-07-17 DIAGNOSIS — G8929 Other chronic pain: Secondary | ICD-10-CM | POA: Diagnosis not present

## 2019-07-17 NOTE — Therapy (Signed)
[place], [place], [address] Phone: [phone]   Fax:  [phone]  Physical Therapy Treatment  Patient Details  Name: [name] Wright MRN: [id number] Date of Birth: Oct 16, 1940 Referring Provider (PT): Geoffery Lyons, NP   Encounter Date: 07/17/2019  PT End of Session - 07/17/19 1451    Visit Number  2    Number of Visits  8    Date for PT Re-Evaluation  08/28/19    Authorization Type  MCR A&B    PT Start Time  1445    PT Stop Time  1525    PT Time Calculation (min)  40 min    Activity Tolerance  Patient tolerated treatment well    Behavior During Therapy  [facility] for tasks assessed/performed       Past Medical History:  Diagnosis Date  . BCC (basal cell carcinoma of skin) 06/24/1993   Left Shoulder  . 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 Cancer ([place])    skin and eye  . Coronary artery disease    NUCLEAR STRESS TEST, 09/30/2009 - EKG negative for ischemia  . Diabetes mellitus without complication ([place])    Treated by diet  . Essential tremor    hands, sometimes legs   . Family history of coronary artery disease    Mother died 67  . H/O subconjunctival hemorrhage   . History of blood transfusion   . Hyperlipidemia   . Hypertension   . 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 Myocardial infarction ([place]) 2006  . 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 Retroperitoneal hematoma    S/P  . Shingles   . 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 Superficial nodular basal cell carcinoma (BCC) 03/02/2016   Left Shoulder    Past Surgical History:  Procedure Laterality Date  . COLONOSCOPY    . CORONARY ANGIOPLASTY WITH STENT PLACEMENT  11/05/2004   RCA DES  . DILATION AND CURETTAGE OF UTERUS    . LID LESION EXCISION Left 02/14/2018   Procedure: LEFT LID LESION EXCISION;   Surgeon: Clista Bernhardt, MD;  Location: [place];  Service: Ophthalmology;  Laterality: Left;  . RECONSTRUCTION OF EYELID Left 02/14/2018   Procedure: TOTAL RECONSTRUCTION OF EYELID;  Surgeon: Clista Bernhardt, MD;  Location: [place];  Service: Ophthalmology;  Laterality: Left;  . SKIN FULL THICKNESS GRAFT Left 02/14/2018   Procedure: SKIN GRAFT FULL THICKNESS LEFT EYE;  Surgeon: Clista Bernhardt, MD;  Location: [place];  Service: Ophthalmology;  Laterality: Left;  . TONSILECTOMY/ADENOIDECTOMY WITH MYRINGOTOMY    . TUBAL LIGATION      There were no vitals filed for this visit.  Subjective Assessment - 07/17/19 1448    Subjective  Patient reports she is feeling a little bit better. Mornings are still her worst time of the day. She still gets over a bit after she walks for extended periods.    Patient Stated Goals  Be able to walk better.    Currently in Pain?  Yes    Pain Score  2     Pain Location  Back    Pain Orientation  Lower;Right;Left    Pain Descriptors / Indicators  Aching;Tightness;Sore;Sharp    Pain Type  Chronic pain    Pain Onset  More than a month  ago    Pain Frequency  Intermittent    Pain Score  0    Pain Location  Neck                       [facility] Adult PT Treatment/Exercise - 07/17/19 0001      Exercises   Exercises  Lumbar;Neck      Lumbar Exercises: Stretches   Single Knee to Chest Stretch  2 reps;20 seconds    Single Knee to Chest Stretch Limitations  seated    Lower Trunk Rotation Limitations  x20 each    Piriformis Stretch  2 reps;30 seconds    Piriformis Stretch Limitations  seated    Figure 4 Stretch  2 reps;20 seconds    Figure 4 Stretch Limitations  seated      Lumbar Exercises: Aerobic   Nustep  L2 x5 min UE/LE      Lumbar Exercises: Standing   Other Standing Lumbar Exercises  Hip extension 2x20 each      Lumbar Exercises: Seated   Sit to Stand  10 reps   2 sets     Lumbar Exercises: Supine   Bent Knee Raise  20 reps   2 sets    Bridge  10 reps   2 sets   Straight Leg Raise  10 reps   2 sets     Lumbar Exercises: Sidelying   Clam  10 reps   2 sets            PT Education - 07/17/19 1450    Education Details  HEP    Person(s) Educated  Patient    Methods  Explanation;Demonstration;Verbal cues;Tactile cues    Comprehension  Verbalized understanding;Returned demonstration;Verbal cues required;Need further instruction;Tactile cues required       PT Short Term Goals - 07/03/19 1545      PT SHORT TERM GOAL #1   Title  Patient will be I with initial HEP to progress in PT.    Time  4    Period  Weeks    Status  New    Target Date  07/31/19      PT SHORT TERM GOAL #2   Title  Patient will be able be able to perform dressing and grooming tasks with minimal difficulty.    Time  4    Period  Weeks    Status  New    Target Date  07/31/19      PT SHORT TERM GOAL #3   Title  Patient will be able to walk >/= 10 minutes with minimal difficulty to allow for improved community access.    Time  4    Period  Weeks    Status  New    Target Date  07/31/19        PT Long Term Goals - 07/03/19 1552      PT LONG TERM GOAL #1   Title  Patient will be I in final HEP to maintain progress in PT.    Time  8    Period  Weeks    Status  New    Target Date  08/28/19      PT LONG TERM GOAL #2   Title  Patient will exhibit imporved BLE strength of grossly >/= 4/5 MMT to improve ability to go up/down stairs    Time  8    Period  Weeks    Status  New    Target Date  08/28/19  PT LONG TERM GOAL #3   Title  Patient will report minimal discomfort with walking >/= 15 minutes to allow for ability to go to grocery store with less limitation.    Time  8    Period  Weeks    Status  New    Target Date  08/28/19      PT LONG TERM GOAL #4   Title  Patient will report improved functional level to </= 49% limitation via FOTO.    Time  8    Period  Weeks    Status  New    Target Date  08/28/19             Plan - 07/17/19 1452    Clinical Impression Statement  Patient is progressing well with her strengthening exercises and reported less pain and cramping with therapy. She does require cueing for proper technique with exercises. Her HEP was not progressed this visit as she seems to be improving with current exercises, she was encouraged to perform gentle stretching in the morning to help loosen up and reduce pain. She would benefit from continued skilled PT to progress mobility and strength to allow for improved walking and activity.    PT Treatment/Interventions  ADLs/Self Care Home Management;Cryotherapy;Electrical Stimulation;Moist Heat;Gait training;Functional mobility training;Therapeutic activities;Therapeutic exercise;Balance training;Neuromuscular re-education;Manual techniques;Patient/family education;Passive range of motion;Dry needling;Joint Manipulations;Spinal Manipulations;Taping    PT Next Visit Plan  Assess HEP and progress PRN, generalized LE and core strengthening, periscapualr and postural strengthening    PT Home Exercise Plan  [id number]: Supine SKTC and piriformis stretch, SLR, LTR, bridge, side lying clamshell, sit-to-stand, banded row and extension with yellow    Consulted and Agree with Plan of Care  Patient       Patient will benefit from skilled therapeutic intervention in order to improve the following deficits and impairments:  Abnormal gait, Decreased range of motion, Decreased activity tolerance, Pain, Postural dysfunction, Decreased strength  Visit Diagnosis: Chronic bilateral low back pain, unspecified whether sciatica present  Cervicalgia  Muscle weakness (generalized)  Other abnormalities of gait and mobility     Problem List Patient Active Problem List   Diagnosis Date Noted  . Aspirin intolerance 06/27/2019  . Failure to thrive (0-17) 02/19/2017  . Shingles right face and eye 02/19/2017  . CAD- RCA DES 2006   . Dyslipidemia, goal LDL  below 70   . Essential hypertension   . Non-insulin treated type 2 diabetes mellitus ([place])     Hilda Blades, PT, DPT, LAT, ATC 07/17/19  3:32 PM Phone: [phone] Fax: [place] [facility] [address] [place], [place], [address] Phone: [phone]   Fax:  [phone]  Name: [name] Wright MRN: [id number] Date of Birth: 02/08/1941

## 2019-07-24 ENCOUNTER — Ambulatory Visit: Payer: Medicare Other | Admitting: Physical Therapy

## 2019-07-24 ENCOUNTER — Other Ambulatory Visit: Payer: Self-pay

## 2019-07-24 ENCOUNTER — Encounter: Payer: Self-pay | Admitting: Physical Therapy

## 2019-07-24 DIAGNOSIS — M6281 Muscle weakness (generalized): Secondary | ICD-10-CM

## 2019-07-24 DIAGNOSIS — G8929 Other chronic pain: Secondary | ICD-10-CM | POA: Diagnosis not present

## 2019-07-24 DIAGNOSIS — R2689 Other abnormalities of gait and mobility: Secondary | ICD-10-CM

## 2019-07-24 DIAGNOSIS — M542 Cervicalgia: Secondary | ICD-10-CM | POA: Diagnosis not present

## 2019-07-24 DIAGNOSIS — M545 Low back pain: Secondary | ICD-10-CM | POA: Diagnosis not present

## 2019-07-24 NOTE — Therapy (Signed)
[place], [place], [address] Phone: [phone]   Fax:  [phone]  Physical Therapy Treatment  Patient Details  Name: [name] Wright MRN: [id number] Date of Birth: [birth year] Referring Provider (PT): Geoffery Lyons, NP   Encounter Date: 07/24/2019  PT End of Session - 07/24/19 1415    Visit Number  3    Number of Visits  8    Date for PT Re-Evaluation  08/28/19    Authorization Type  MCR A&B    PT Start Time  1400    PT Stop Time  1445    PT Time Calculation (min)  45 min    Activity Tolerance  Patient tolerated treatment well    Behavior During Therapy  [facility] for tasks assessed/performed       Past Medical History:  Diagnosis Date  . BCC (basal cell carcinoma of skin) 06/24/1993   Left Shoulder  . 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 Cancer ([place])    skin and eye  . Coronary artery disease    NUCLEAR STRESS TEST, 09/30/2009 - EKG negative for ischemia  . Diabetes mellitus without complication ([place])    Treated by diet  . Essential tremor    hands, sometimes legs   . Family history of coronary artery disease    Mother died 72  . H/O subconjunctival hemorrhage   . History of blood transfusion   . Hyperlipidemia   . Hypertension   . 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 Myocardial infarction ([place]) 2006  . 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 Retroperitoneal hematoma    S/P  . Shingles   . 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 Superficial nodular basal cell carcinoma (BCC) 03/02/2016   Left Shoulder    Past Surgical History:  Procedure Laterality Date  . COLONOSCOPY    . CORONARY ANGIOPLASTY WITH STENT PLACEMENT  11/05/2004   RCA DES  . DILATION AND CURETTAGE OF UTERUS    . LID LESION EXCISION Left 02/14/2018   Procedure: LEFT LID LESION EXCISION;   Surgeon: Clista Bernhardt, MD;  Location: [place];  Service: Ophthalmology;  Laterality: Left;  . RECONSTRUCTION OF EYELID Left 02/14/2018   Procedure: TOTAL RECONSTRUCTION OF EYELID;  Surgeon: Clista Bernhardt, MD;  Location: [facility] [place];  Service: Ophthalmology;  Laterality: Left;  . SKIN FULL THICKNESS GRAFT Left 02/14/2018   Procedure: SKIN GRAFT FULL THICKNESS LEFT EYE;  Surgeon: Clista Bernhardt, MD;  Location: [place];  Service: Ophthalmology;  Laterality: Left;  . TONSILECTOMY/ADENOIDECTOMY WITH MYRINGOTOMY    . TUBAL LIGATION      There were no vitals filed for this visit.  Subjective Assessment - 07/24/19 1412    Subjective  Patient reports she continues to improve. She did go to the grocery store once this past week and she started having pain and had difficulty walking toward the end.    Patient Stated Goals  Be able to walk better.    Currently in Pain?  Yes    Pain Score  1     Pain Location  Back    Pain Orientation  Lower    Pain Descriptors / Indicators  Aching;Sore    Pain Type  Chronic pain    Pain Onset  More than a month ago  Pain Frequency  Intermittent                       [facility] Adult PT Treatment/Exercise - 07/24/19 0001      Exercises   Exercises  Lumbar      Lumbar Exercises: Stretches   Single Knee to Chest Stretch  2 reps;20 seconds    Single Knee to Chest Stretch Limitations  supine    Lower Trunk Rotation Limitations  x20 each    Piriformis Stretch  2 reps;20 seconds    Piriformis Stretch Limitations  supine      Lumbar Exercises: Aerobic   Nustep  L1 x 6 min UE/LE      Lumbar Exercises: Seated   Sit to Stand  10 reps   2 sets     Lumbar Exercises: Supine   Bridge  10 reps   2 sets   Straight Leg Raise  10 reps   2 sets   Other Supine Lumbar Exercises  Alternating heel taps 2x10      Lumbar Exercises: Quadruped   Single Arm Raise  10 reps    Straight Leg Raise  10 reps             PT Education - 07/24/19 1414     Education Details  HEP    Person(s) Educated  Patient    Methods  Explanation    Comprehension  Verbalized understanding;Returned demonstration;Verbal cues required;Need further instruction       PT Short Term Goals - 07/03/19 1545      PT SHORT TERM GOAL #1   Title  Patient will be I with initial HEP to progress in PT.    Time  4    Period  Weeks    Status  New    Target Date  07/31/19      PT SHORT TERM GOAL #2   Title  Patient will be able be able to perform dressing and grooming tasks with minimal difficulty.    Time  4    Period  Weeks    Status  New    Target Date  07/31/19      PT SHORT TERM GOAL #3   Title  Patient will be able to walk >/= 10 minutes with minimal difficulty to allow for improved community access.    Time  4    Period  Weeks    Status  New    Target Date  07/31/19        PT Long Term Goals - 07/03/19 1552      PT LONG TERM GOAL #1   Title  Patient will be I in final HEP to maintain progress in PT.    Time  8    Period  Weeks    Status  New    Target Date  08/28/19      PT LONG TERM GOAL #2   Title  Patient will exhibit imporved BLE strength of grossly >/= 4/5 MMT to improve ability to go up/down stairs    Time  8    Period  Weeks    Status  New    Target Date  08/28/19      PT LONG TERM GOAL #3   Title  Patient will report minimal discomfort with walking >/= 15 minutes to allow for ability to go to grocery store with less limitation.    Time  8    Period  Weeks  Status  New    Target Date  08/28/19      PT LONG TERM GOAL #4   Title  Patient will report improved functional level to </= 49% limitation via FOTO.    Time  8    Period  Weeks    Status  New    Target Date  08/28/19            Plan - 07/24/19 1415    Clinical Impression Statement  Patient is progressing well and tolerated advancement in core strengthening this visit. She continues to exhibit strength deficit and decreased activity tolerance with walking. She  requires cueing for core control to avoid arching lower back which elicits pain. She was encouraged to continue working on frequent walks around her house to improve tolerance. She would benefit from continued skilled PT to progress mobility and strength to allow for improved walking and activity.    PT Treatment/Interventions  ADLs/Self Care Home Management;Cryotherapy;Electrical Stimulation;Moist Heat;Gait training;Functional mobility training;Therapeutic activities;Therapeutic exercise;Balance training;Neuromuscular re-education;Manual techniques;Patient/family education;Passive range of motion;Dry needling;Joint Manipulations;Spinal Manipulations;Taping    PT Next Visit Plan  Assess HEP and progress PRN, generalized LE and core strengthening, periscapualr and postural strengthening    PT Home Exercise Plan  [id number]: Supine SKTC and piriformis stretch, SLR, LTR, bridge, side lying clamshell, sit-to-stand, banded row and extension with yellow    Consulted and Agree with Plan of Care  Patient       Patient will benefit from skilled therapeutic intervention in order to improve the following deficits and impairments:  Abnormal gait, Decreased range of motion, Decreased activity tolerance, Pain, Postural dysfunction, Decreased strength  Visit Diagnosis: Chronic bilateral low back pain, unspecified whether sciatica present  Cervicalgia  Muscle weakness (generalized)  Other abnormalities of gait and mobility     Problem List Patient Active Problem List   Diagnosis Date Noted  . Aspirin intolerance 06/27/2019  . Failure to thrive (0-17) 02/19/2017  . Shingles right face and eye 02/19/2017  . CAD- RCA DES 2006   . Dyslipidemia, goal LDL below 70   . Essential hypertension   . Non-insulin treated type 2 diabetes mellitus ([place])     Hilda Blades, PT, DPT, LAT, ATC 07/24/19  4:51 PM Phone: [phone] Fax: [place] [facility] [address] [place], [place], [address] Phone: [phone]   Fax:  [phone]  Name: ZINACHIDI SLUSARZ MRN: [id number] Date of Birth: 02-22-1941

## 2019-07-31 ENCOUNTER — Other Ambulatory Visit: Payer: Self-pay

## 2019-07-31 ENCOUNTER — Ambulatory Visit: Payer: Medicare Other | Admitting: Physical Therapy

## 2019-07-31 ENCOUNTER — Encounter: Payer: Self-pay | Admitting: Physical Therapy

## 2019-07-31 DIAGNOSIS — R2689 Other abnormalities of gait and mobility: Secondary | ICD-10-CM | POA: Diagnosis not present

## 2019-07-31 DIAGNOSIS — G8929 Other chronic pain: Secondary | ICD-10-CM

## 2019-07-31 DIAGNOSIS — M545 Low back pain: Secondary | ICD-10-CM | POA: Diagnosis not present

## 2019-07-31 DIAGNOSIS — M6281 Muscle weakness (generalized): Secondary | ICD-10-CM | POA: Diagnosis not present

## 2019-07-31 DIAGNOSIS — M542 Cervicalgia: Secondary | ICD-10-CM | POA: Diagnosis not present

## 2019-07-31 NOTE — Therapy (Signed)
[place], [place], [address] Phone: [phone]   Fax:  [phone]  Physical Therapy Treatment  Patient Details  Name: [name] Wright MRN: [id number] Date of Birth: 1940-11-11 Referring Provider (PT): Geoffery Lyons, NP   Encounter Date: 07/31/2019  PT End of Session - 07/31/19 1400    Visit Number  4    Number of Visits  8    Date for PT Re-Evaluation  08/28/19    Authorization Type  MCR A&B    PT Start Time  [id number]    PT Stop Time  1437    PT Time Calculation (min)  42 min    Activity Tolerance  Patient tolerated treatment well    Behavior During Therapy  [facility] for tasks assessed/performed       Past Medical History:  Diagnosis Date  . BCC (basal cell carcinoma of skin) 06/24/1993   Left Shoulder  . 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 Cancer ([place])    skin and eye  . Coronary artery disease    NUCLEAR STRESS TEST, 09/30/2009 - EKG negative for ischemia  . Diabetes mellitus without complication ([place])    Treated by diet  . Essential tremor    hands, sometimes legs   . Family history of coronary artery disease    Mother died 38  . H/O subconjunctival hemorrhage   . History of blood transfusion   . Hyperlipidemia   . Hypertension   . 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 Myocardial infarction ([place]) 2006  . 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 Retroperitoneal hematoma    S/P  . Shingles   . 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 Superficial nodular basal cell carcinoma (BCC) 03/02/2016   Left Shoulder    Past Surgical History:  Procedure Laterality Date  . COLONOSCOPY    . CORONARY ANGIOPLASTY WITH STENT PLACEMENT  11/05/2004   RCA DES  . DILATION AND CURETTAGE OF UTERUS    . LID LESION EXCISION Left 02/14/2018   Procedure: LEFT LID LESION EXCISION;   Surgeon: Clista Bernhardt, MD;  Location: [place];  Service: Ophthalmology;  Laterality: Left;  . RECONSTRUCTION OF EYELID Left 02/14/2018   Procedure: TOTAL RECONSTRUCTION OF EYELID;  Surgeon: Clista Bernhardt, MD;  Location: [place];  Service: Ophthalmology;  Laterality: Left;  . SKIN FULL THICKNESS GRAFT Left 02/14/2018   Procedure: SKIN GRAFT FULL THICKNESS LEFT EYE;  Surgeon: Clista Bernhardt, MD;  Location: [place];  Service: Ophthalmology;  Laterality: Left;  . TONSILECTOMY/ADENOIDECTOMY WITH MYRINGOTOMY    . TUBAL LIGATION      There were no vitals filed for this visit.  Subjective Assessment - 07/31/19 1357    Subjective  Patient reports she is doing well and she feels she is walking a little bit straighter.    Patient Stated Goals  Be able to walk better.    Currently in Pain?  Yes    Pain Score  0-No pain    Pain Location  Back    Pain Orientation  Lower    Pain Descriptors / Indicators  Aching;Sore    Pain Type  Chronic pain    Pain Onset  More than a month ago    Pain Frequency  Intermittent  [facility] Adult PT Treatment/Exercise - 07/31/19 0001      Exercises   Exercises  Lumbar;Neck      Neck Exercises: Theraband   Shoulder Extension  10 reps;Red   2 sets   Rows  10 reps;Red   2 sets     Lumbar Exercises: Stretches   Single Knee to Chest Stretch  2 reps;20 seconds    Single Knee to Chest Stretch Limitations  supine    Lower Trunk Rotation Limitations  x20 each    Piriformis Stretch  2 reps;20 seconds    Piriformis Stretch Limitations  supine      Lumbar Exercises: Aerobic   Nustep  L2 x 6 min UE/LE      Lumbar Exercises: Standing   Other Standing Lumbar Exercises  Dead lift with 10 lbs on 4" step 2x10      Lumbar Exercises: Supine   Bridge  10 reps   2 sets   Straight Leg Raise  10 reps   2 sets   Other Supine Lumbar Exercises  Alternating heel taps 2x10      Lumbar Exercises: Sidelying   Clam  15 reps   2 sets      Lumbar Exercises: Quadruped   Straight Leg Raise  10 reps   2 sets            PT Education - 07/31/19 1359    Education Details  HEP    Person(s) Educated  Patient    Methods  Explanation;Demonstration;Verbal cues    Comprehension  Verbalized understanding;Returned demonstration;Verbal cues required;Need further instruction       PT Short Term Goals - 07/31/19 1423      PT SHORT TERM GOAL #1   Title  Patient will be I with initial HEP to progress in PT.    Time  4    Period  Weeks    Status  On-going    Target Date  07/31/19      PT SHORT TERM GOAL #2   Title  Patient will be able be able to perform dressing and grooming tasks with minimal difficulty.    Time  4    Period  Weeks    Status  Achieved      PT SHORT TERM GOAL #3   Title  Patient will be able to walk >/= 10 minutes with minimal difficulty to allow for improved community access.    Time  4    Period  Weeks    Status  On-going    Target Date  07/31/19        PT Long Term Goals - 07/03/19 1552      PT LONG TERM GOAL #1   Title  Patient will be I in final HEP to maintain progress in PT.    Time  8    Period  Weeks    Status  New    Target Date  08/28/19      PT LONG TERM GOAL #2   Title  Patient will exhibit imporved BLE strength of grossly >/= 4/5 MMT to improve ability to go up/down stairs    Time  8    Period  Weeks    Status  New    Target Date  08/28/19      PT LONG TERM GOAL #3   Title  Patient will report minimal discomfort with walking >/= 15 minutes to allow for ability to go to grocery store with less limitation.  Time  8    Period  Weeks    Status  New    Target Date  08/28/19      PT LONG TERM GOAL #4   Title  Patient will report improved functional level to </= 49% limitation via FOTO.    Time  8    Period  Weeks    Status  New    Target Date  08/28/19            Plan - 07/31/19 1400    Clinical Impression Statement  Patient is progressing well with her  strengthening exercises and seems to be improving in function as she has no difficulty with dressing tasks, but she continues to report difficulty with walking extended periods. She requires cueing for proper core engagement with exercises and states this helps with her control and reducing any lower back discomfort. She required cueing for proper lifting mechanics for hip hinge technique. She would benefit from continued skilled PT to progress mobility and strength to allow for improved walking and activity.    PT Treatment/Interventions  ADLs/Self Care Home Management;Cryotherapy;Electrical Stimulation;Moist Heat;Gait training;Functional mobility training;Therapeutic activities;Therapeutic exercise;Balance training;Neuromuscular re-education;Manual techniques;Patient/family education;Passive range of motion;Dry needling;Joint Manipulations;Spinal Manipulations;Taping    PT Next Visit Plan  Assess HEP and progress PRN, generalized LE and core strengthening, periscapualr and postural strengthening    PT Home Exercise Plan  [id number]: Supine SKTC and piriformis stretch, SLR, LTR, bridge, side lying clamshell, sit-to-stand, banded row and extension with yellow    Consulted and Agree with Plan of Care  Patient       Patient will benefit from skilled therapeutic intervention in order to improve the following deficits and impairments:  Abnormal gait, Decreased range of motion, Decreased activity tolerance, Pain, Postural dysfunction, Decreased strength  Visit Diagnosis: Chronic bilateral low back pain, unspecified whether sciatica present  Cervicalgia  Muscle weakness (generalized)  Other abnormalities of gait and mobility     Problem List Patient Active Problem List   Diagnosis Date Noted  . Aspirin intolerance 06/27/2019  . Failure to thrive (0-17) 02/19/2017  . Shingles right face and eye 02/19/2017  . CAD- RCA DES 2006   . Dyslipidemia, goal LDL below 70   . Essential hypertension   .  Non-insulin treated type 2 diabetes mellitus ([place])     Hilda Blades, PT, DPT, LAT, ATC 07/31/19  2:37 PM Phone: [phone] Fax: [place] [facility] [address] [place], [place], [address] Phone: [phone]   Fax:  [phone]  Name: KAMORA COURTNEY MRN: [id number] Date of Birth: Feb 23, 1941

## 2019-08-07 ENCOUNTER — Ambulatory Visit: Payer: Medicare Other | Admitting: Physical Therapy

## 2019-08-07 ENCOUNTER — Encounter: Payer: Self-pay | Admitting: Physical Therapy

## 2019-08-07 ENCOUNTER — Other Ambulatory Visit: Payer: Self-pay

## 2019-08-07 DIAGNOSIS — G8929 Other chronic pain: Secondary | ICD-10-CM | POA: Diagnosis not present

## 2019-08-07 DIAGNOSIS — M6281 Muscle weakness (generalized): Secondary | ICD-10-CM | POA: Diagnosis not present

## 2019-08-07 DIAGNOSIS — M542 Cervicalgia: Secondary | ICD-10-CM

## 2019-08-07 DIAGNOSIS — M545 Low back pain: Secondary | ICD-10-CM | POA: Diagnosis not present

## 2019-08-07 DIAGNOSIS — R2689 Other abnormalities of gait and mobility: Secondary | ICD-10-CM

## 2019-08-07 NOTE — Therapy (Signed)
[place], [place], [address] Phone: [phone]   Fax:  [phone]  Physical Therapy Treatment  Patient Details  Name: [name] Wright MRN: [id number] Date of Birth: [birth year] Referring Provider (PT): Geoffery Lyons, NP   Encounter Date: 08/07/2019  PT End of Session - 08/07/19 1348    Visit Number  5    Number of Visits  8    Date for PT Re-Evaluation  08/28/19    Authorization Type  MCR A&B    PT Start Time  [id number]    PT Stop Time  1420    PT Time Calculation (min)  42 min    Activity Tolerance  Patient tolerated treatment well    Behavior During Therapy  [facility] for tasks assessed/performed       Past Medical History:  Diagnosis Date  . BCC (basal cell carcinoma of skin) 06/24/1993   Left Shoulder  . 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 Cancer ([place])    skin and eye  . Coronary artery disease    NUCLEAR STRESS TEST, 09/30/2009 - EKG negative for ischemia  . Diabetes mellitus without complication ([place])    Treated by diet  . Essential tremor    hands, sometimes legs   . Family history of coronary artery disease    Mother died 24  . H/O subconjunctival hemorrhage   . History of blood transfusion   . Hyperlipidemia   . Hypertension   . 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 Myocardial infarction ([place]) 2006  . 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 Retroperitoneal hematoma    S/P  . Shingles   . 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 Superficial nodular basal cell carcinoma (BCC) 03/02/2016   Left Shoulder    Past Surgical History:  Procedure Laterality Date  . COLONOSCOPY    . CORONARY ANGIOPLASTY WITH STENT PLACEMENT  11/05/2004   RCA DES  . DILATION AND CURETTAGE OF UTERUS    . LID LESION EXCISION Left 02/14/2018   Procedure: LEFT LID LESION EXCISION;   Surgeon: Clista Bernhardt, MD;  Location: [place];  Service: Ophthalmology;  Laterality: Left;  . RECONSTRUCTION OF EYELID Left 02/14/2018   Procedure: TOTAL RECONSTRUCTION OF EYELID;  Surgeon: Clista Bernhardt, MD;  Location: [place];  Service: Ophthalmology;  Laterality: Left;  . SKIN FULL THICKNESS GRAFT Left 02/14/2018   Procedure: SKIN GRAFT FULL THICKNESS LEFT EYE;  Surgeon: Clista Bernhardt, MD;  Location: [place];  Service: Ophthalmology;  Laterality: Left;  . TONSILECTOMY/ADENOIDECTOMY WITH MYRINGOTOMY    . TUBAL LIGATION      There were no vitals filed for this visit.  Subjective Assessment - 08/07/19 1344    Subjective  Patient reports she is having a little more pain this visit due to walking around outside more this morning. She states that she has been feeling better and over the past few days has been feeling pretty good, and she notices she can stand up straighter. She denies any pain at night.    Patient Stated Goals  Be able to walk better.    Currently in Pain?  Yes    Pain Score  5     Pain Location  Back    Pain Orientation  Lower    Pain Descriptors /  Indicators  Aching    Pain Type  Chronic pain    Pain Onset  More than a month ago    Pain Frequency  Intermittent                       [facility] Adult PT Treatment/Exercise - 08/07/19 0001      Exercises   Exercises  Lumbar;Neck      Neck Exercises: Theraband   Shoulder Extension  15 reps;Red   2 sets   Rows  15 reps;Red   2 sets     Lumbar Exercises: Stretches   Lower Trunk Rotation Limitations  5 sec hold x10      Lumbar Exercises: Aerobic   Nustep  L3 x 5 min UE/LE      Lumbar Exercises: Standing   Other Standing Lumbar Exercises  Dead lift with 10 lbs on 4" step 2x10      Lumbar Exercises: Seated   Sit to Stand  10 reps   2 sets   Sit to Stand Limitations  cued to stand up tall at top      Lumbar Exercises: Supine   Bridge  10 reps   2 sets   Other Supine Lumbar Exercises   Alternating heel taps 3x10 (5 each side)      Lumbar Exercises: Sidelying   Clam  10 reps   2 sets   Clam Limitations  yellow             PT Education - 08/07/19 1347    Education Details  HEP    Person(s) Educated  Patient    Methods  Explanation;Demonstration;Verbal cues    Comprehension  Verbalized understanding;Returned demonstration;Verbal cues required;Need further instruction       PT Short Term Goals - 07/31/19 1423      PT SHORT TERM GOAL #1   Title  Patient will be I with initial HEP to progress in PT.    Time  4    Period  Weeks    Status  On-going    Target Date  07/31/19      PT SHORT TERM GOAL #2   Title  Patient will be able be able to perform dressing and grooming tasks with minimal difficulty.    Time  4    Period  Weeks    Status  Achieved      PT SHORT TERM GOAL #3   Title  Patient will be able to walk >/= 10 minutes with minimal difficulty to allow for improved community access.    Time  4    Period  Weeks    Status  On-going    Target Date  07/31/19        PT Long Term Goals - 07/03/19 1552      PT LONG TERM GOAL #1   Title  Patient will be I in final HEP to maintain progress in PT.    Time  8    Period  Weeks    Status  New    Target Date  08/28/19      PT LONG TERM GOAL #2   Title  Patient will exhibit imporved BLE strength of grossly >/= 4/5 MMT to improve ability to go up/down stairs    Time  8    Period  Weeks    Status  New    Target Date  08/28/19      PT LONG TERM GOAL #3   Title  Patient will report minimal discomfort with walking >/= 15 minutes to allow for ability to go to grocery store with less limitation.    Time  8    Period  Weeks    Status  New    Target Date  08/28/19      PT LONG TERM GOAL #4   Title  Patient will report improved functional level to </= 49% limitation via FOTO.    Time  8    Period  Weeks    Status  New    Target Date  08/28/19            Plan - 08/07/19 1348    Clinical  Impression Statement  Patient continues to progress well with her strengthening exercises. She does require frequent rest breaks during therapy and reports continued difficulty walking extended periods due to low back ache. She did well with her lifting technique but did require occasional cueing for hip hinge. She would benefit from continued skilled PT to progress mobility and strength to allow for improved walking and activity.    PT Treatment/Interventions  ADLs/Self Care Home Management;Cryotherapy;Electrical Stimulation;Moist Heat;Gait training;Functional mobility training;Therapeutic activities;Therapeutic exercise;Balance training;Neuromuscular re-education;Manual techniques;Patient/family education;Passive range of motion;Dry needling;Joint Manipulations;Spinal Manipulations;Taping    PT Next Visit Plan  Assess HEP and progress PRN, generalized LE and core strengthening, periscapualr and postural strengthening    PT Home Exercise Plan  [id number]: Supine SKTC and piriformis stretch, SLR, LTR, bridge, side lying clamshell with yellow, sit-to-stand, banded row and extension with yellow    Consulted and Agree with Plan of Care  Patient       Patient will benefit from skilled therapeutic intervention in order to improve the following deficits and impairments:  Abnormal gait, Decreased range of motion, Decreased activity tolerance, Pain, Postural dysfunction, Decreased strength  Visit Diagnosis: Chronic bilateral low back pain, unspecified whether sciatica present  Cervicalgia  Other abnormalities of gait and mobility  Muscle weakness (generalized)     Problem List Patient Active Problem List   Diagnosis Date Noted  . Aspirin intolerance 06/27/2019  . Failure to thrive (0-17) 02/19/2017  . Shingles right face and eye 02/19/2017  . CAD- RCA DES 2006   . Dyslipidemia, goal LDL below 70   . Essential hypertension   . Non-insulin treated type 2 diabetes mellitus ([place])     Hilda Blades, PT, DPT, LAT, ATC 08/07/19  2:24 PM Phone: [phone] Fax: [place] [facility] [address] [place], [place], [address] Phone: [phone]   Fax:  [phone]  Name: [name] Wright MRN: [id number] Date of Birth: 11-09-1940

## 2019-08-08 DIAGNOSIS — F329 Major depressive disorder, single episode, unspecified: Secondary | ICD-10-CM | POA: Diagnosis not present

## 2019-08-08 DIAGNOSIS — E785 Hyperlipidemia, unspecified: Secondary | ICD-10-CM | POA: Diagnosis not present

## 2019-08-08 DIAGNOSIS — M8580 Other specified disorders of bone density and structure, unspecified site: Secondary | ICD-10-CM | POA: Diagnosis not present

## 2019-08-08 DIAGNOSIS — M5416 Radiculopathy, lumbar region: Secondary | ICD-10-CM | POA: Diagnosis not present

## 2019-08-08 DIAGNOSIS — I2510 Atherosclerotic heart disease of native coronary artery without angina pectoris: Secondary | ICD-10-CM | POA: Diagnosis not present

## 2019-08-08 DIAGNOSIS — K219 Gastro-esophageal reflux disease without esophagitis: Secondary | ICD-10-CM | POA: Diagnosis not present

## 2019-08-08 DIAGNOSIS — I10 Essential (primary) hypertension: Secondary | ICD-10-CM | POA: Diagnosis not present

## 2019-08-08 DIAGNOSIS — Z1331 Encounter for screening for depression: Secondary | ICD-10-CM | POA: Diagnosis not present

## 2019-08-08 DIAGNOSIS — E871 Hypo-osmolality and hyponatremia: Secondary | ICD-10-CM | POA: Diagnosis not present

## 2019-08-08 DIAGNOSIS — E1159 Type 2 diabetes mellitus with other circulatory complications: Secondary | ICD-10-CM | POA: Diagnosis not present

## 2019-08-15 ENCOUNTER — Other Ambulatory Visit: Payer: Self-pay

## 2019-08-15 ENCOUNTER — Ambulatory Visit: Payer: Medicare Other | Attending: Family Medicine | Admitting: Physical Therapy

## 2019-08-15 ENCOUNTER — Encounter: Payer: Self-pay | Admitting: Physical Therapy

## 2019-08-15 DIAGNOSIS — M545 Low back pain, unspecified: Secondary | ICD-10-CM

## 2019-08-15 DIAGNOSIS — M542 Cervicalgia: Secondary | ICD-10-CM | POA: Diagnosis not present

## 2019-08-15 DIAGNOSIS — R2689 Other abnormalities of gait and mobility: Secondary | ICD-10-CM | POA: Insufficient documentation

## 2019-08-15 DIAGNOSIS — M6281 Muscle weakness (generalized): Secondary | ICD-10-CM | POA: Insufficient documentation

## 2019-08-15 DIAGNOSIS — G8929 Other chronic pain: Secondary | ICD-10-CM | POA: Insufficient documentation

## 2019-08-15 NOTE — Therapy (Signed)
[place], [place], [address] Phone: [phone]   Fax:  [phone]  Physical Therapy Treatment  Patient Details  Name: [name] Wright MRN: [id number] Date of Birth: 1940-10-06 Referring Provider (PT): Geoffery Lyons, NP   Encounter Date: 08/15/2019  PT End of Session - 08/15/19 1436    Visit Number  6    Number of Visits  8    Date for PT Re-Evaluation  08/28/19    Authorization Type  MCR A&B    PT Start Time  1430    PT Stop Time  1515    PT Time Calculation (min)  45 min    Activity Tolerance  Patient tolerated treatment well    Behavior During Therapy  [facility] for tasks assessed/performed       Past Medical History:  Diagnosis Date  . BCC (basal cell carcinoma of skin) 06/24/1993   Left Shoulder  . 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 Cancer ([place])    skin and eye  . Coronary artery disease    NUCLEAR STRESS TEST, 09/30/2009 - EKG negative for ischemia  . Diabetes mellitus without complication ([place])    Treated by diet  . Essential tremor    hands, sometimes legs   . Family history of coronary artery disease    Mother died 62  . H/O subconjunctival hemorrhage   . History of blood transfusion   . Hyperlipidemia   . Hypertension   . 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 Myocardial infarction ([place]) 2006  . 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 Retroperitoneal hematoma    S/P  . Shingles   . 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 Superficial nodular basal cell carcinoma (BCC) 03/02/2016   Left Shoulder    Past Surgical History:  Procedure Laterality Date  . COLONOSCOPY    . CORONARY ANGIOPLASTY WITH STENT PLACEMENT  11/05/2004   RCA DES  . DILATION AND CURETTAGE OF UTERUS    . LID LESION EXCISION Left 02/14/2018   Procedure: LEFT LID LESION EXCISION;   Surgeon: Clista Bernhardt, MD;  Location: [place];  Service: Ophthalmology;  Laterality: Left;  . RECONSTRUCTION OF EYELID Left 02/14/2018   Procedure: TOTAL RECONSTRUCTION OF EYELID;  Surgeon: Clista Bernhardt, MD;  Location: [place];  Service: Ophthalmology;  Laterality: Left;  . SKIN FULL THICKNESS GRAFT Left 02/14/2018   Procedure: SKIN GRAFT FULL THICKNESS LEFT EYE;  Surgeon: Clista Bernhardt, MD;  Location: [place];  Service: Ophthalmology;  Laterality: Left;  . TONSILECTOMY/ADENOIDECTOMY WITH MYRINGOTOMY    . TUBAL LIGATION      There were no vitals filed for this visit.  Subjective Assessment - 08/15/19 1434    Subjective  Patient reports today is a bad day for her, and she has been having a bad week. There was no mechanism to make her pain worse. States that this morning her back was hurting more than normal.    Patient Stated Goals  Be able to walk better.    Currently in Pain?  Yes    Pain Score  7     Pain Location  Back    Pain Orientation  Lower    Pain Descriptors / Indicators  Aching    Pain Type  Chronic pain  Pain Onset  More than a month ago    Pain Frequency  Intermittent    Aggravating Factors   Standing, walking                       [facility] Adult PT Treatment/Exercise - 08/15/19 0001      Exercises   Exercises  Lumbar;Neck      Neck Exercises: Theraband   Shoulder Extension  15 reps;Red   2 sets   Rows  15 reps;Red   2 sets     Lumbar Exercises: Stretches   Single Knee to Chest Stretch  2 reps;20 seconds    Lower Trunk Rotation Limitations  5 sec hold x10    Piriformis Stretch  2 reps;20 seconds      Lumbar Exercises: Seated   Sit to Stand  5 reps   4 sets   Sit to Stand Limitations  Airex pad in chair, 1st set without weight, last 3 sets holding 5# kettlebell      Lumbar Exercises: Supine   Bent Knee Raise  10 reps    Bridge  10 reps   2 sets   Straight Leg Raise  10 reps    Other Supine Lumbar Exercises  Alternating heel taps  3x10 (5 each side)      Lumbar Exercises: Sidelying   Clam  10 reps   2 sets   Clam Limitations  yellow             PT Education - 08/15/19 1436    Education Details  HEP    Person(s) Educated  Patient    Methods  Explanation;Demonstration;Verbal cues    Comprehension  Verbalized understanding;Returned demonstration;Verbal cues required;Need further instruction       PT Short Term Goals - 07/31/19 1423      PT SHORT TERM GOAL #1   Title  Patient will be I with initial HEP to progress in PT.    Time  4    Period  Weeks    Status  On-going    Target Date  07/31/19      PT SHORT TERM GOAL #2   Title  Patient will be able be able to perform dressing and grooming tasks with minimal difficulty.    Time  4    Period  Weeks    Status  Achieved      PT SHORT TERM GOAL #3   Title  Patient will be able to walk >/= 10 minutes with minimal difficulty to allow for improved community access.    Time  4    Period  Weeks    Status  On-going    Target Date  07/31/19        PT Long Term Goals - 07/03/19 1552      PT LONG TERM GOAL #1   Title  Patient will be I in final HEP to maintain progress in PT.    Time  8    Period  Weeks    Status  New    Target Date  08/28/19      PT LONG TERM GOAL #2   Title  Patient will exhibit imporved BLE strength of grossly >/= 4/5 MMT to improve ability to go up/down stairs    Time  8    Period  Weeks    Status  New    Target Date  08/28/19      PT LONG TERM GOAL #3   Title  Patient will report minimal discomfort with walking >/= 15 minutes to allow for ability to go to grocery store with less limitation.    Time  8    Period  Weeks    Status  New    Target Date  08/28/19      PT LONG TERM GOAL #4   Title  Patient will report improved functional level to </= 49% limitation via FOTO.    Time  8    Period  Weeks    Status  New    Target Date  08/28/19            Plan - 08/15/19 1437    Clinical Impression Statement   Patient reported increased pain this visit but she did well with her exercises without increased pain during therapy. She continues to exhibit LE and core weakness that is contributing to her limitation with walking extended periods. She did report improved pain level and walking ability following therapy and states that exercising at home does help her. She would benefit from continued skilled PT to progress mobility and strength to allow for improved walking and activity.    PT Treatment/Interventions  ADLs/Self Care Home Management;Cryotherapy;Electrical Stimulation;Moist Heat;Gait training;Functional mobility training;Therapeutic activities;Therapeutic exercise;Balance training;Neuromuscular re-education;Manual techniques;Patient/family education;Passive range of motion;Dry needling;Joint Manipulations;Spinal Manipulations;Taping    PT Next Visit Plan  Assess HEP and progress PRN, generalized LE and core strengthening, periscapualr and postural strengthening    PT Home Exercise Plan  [id number]: Supine SKTC and piriformis stretch, SLR, LTR, bridge, side lying clamshell with yellow, sit-to-stand, banded row and extension with yellow    Consulted and Agree with Plan of Care  Patient       Patient will benefit from skilled therapeutic intervention in order to improve the following deficits and impairments:  Abnormal gait, Decreased range of motion, Decreased activity tolerance, Pain, Postural dysfunction, Decreased strength  Visit Diagnosis: Chronic bilateral low back pain, unspecified whether sciatica present  Cervicalgia  Other abnormalities of gait and mobility  Muscle weakness (generalized)     Problem List Patient Active Problem List   Diagnosis Date Noted  . Aspirin intolerance 06/27/2019  . Failure to thrive (0-17) 02/19/2017  . Shingles right face and eye 02/19/2017  . CAD- RCA DES 2006   . Dyslipidemia, goal LDL below 70   . Essential hypertension   . Non-insulin treated type 2  diabetes mellitus ([place])     Hilda Blades, PT, DPT, LAT, ATC 08/15/19  3:22 PM Phone: [phone] Fax: [facility] [address] [place], [place], [address] Phone: [phone]   Fax:  [phone]  Name: [name] Wright MRN: [id number] Date of Birth: October 09, 1940

## 2019-08-16 ENCOUNTER — Ambulatory Visit: Payer: Medicare Other | Attending: Internal Medicine

## 2019-08-16 DIAGNOSIS — Z23 Encounter for immunization: Secondary | ICD-10-CM

## 2019-08-16 NOTE — Progress Notes (Signed)
   Covid-19 Vaccination Clinic  Name:  [name] Wright    MRN: [id number] DOB: 09-25-1940  08/16/2019  [name] was observed post Covid-19 immunization for 30 minutes based on pre-vaccination screening without incident. She was provided with Vaccine Information Sheet and instruction to access the V-Safe system.   [name] was instructed to call 911 with any severe reactions post vaccine: [name] Difficulty breathing  . Swelling of face and throat  . A fast heartbeat  . A bad rash all over body  . Dizziness and weakness

## 2019-08-22 ENCOUNTER — Ambulatory Visit: Payer: Medicare Other | Admitting: Physical Therapy

## 2019-08-29 ENCOUNTER — Ambulatory Visit: Payer: Medicare Other | Admitting: Physical Therapy

## 2019-09-11 ENCOUNTER — Ambulatory Visit: Payer: Medicare Other | Attending: Internal Medicine

## 2019-09-11 DIAGNOSIS — Z23 Encounter for immunization: Secondary | ICD-10-CM

## 2019-09-11 NOTE — Progress Notes (Signed)
   Covid-19 Vaccination Clinic  Name:  [name] Wright    MRN: [id number] DOB: 1941/02/06  09/11/2019  [name] was observed post Covid-19 immunization for 15 minutes without incident. She was provided with Vaccine Information Sheet and instruction to access the V-Safe system.   [name] was instructed to call 911 with any severe reactions post vaccine: [name] Difficulty breathing  . Swelling of face and throat  . A fast heartbeat  . A bad rash all over body  . Dizziness and weakness   Immunizations Administered    Name Date Dose VIS Date Route   Pfizer COVID-19 Vaccine 09/11/2019  2:25 PM 0.3 mL 05/24/2019 Intramuscular   Manufacturer: [place]   Lot: [id number]   [place]: [id number]

## 2019-09-23 ENCOUNTER — Encounter: Payer: Self-pay | Admitting: Physical Therapy

## 2019-09-23 ENCOUNTER — Ambulatory Visit: Payer: Medicare Other | Attending: Family Medicine | Admitting: Physical Therapy

## 2019-09-23 ENCOUNTER — Other Ambulatory Visit: Payer: Self-pay

## 2019-09-23 DIAGNOSIS — M542 Cervicalgia: Secondary | ICD-10-CM | POA: Insufficient documentation

## 2019-09-23 DIAGNOSIS — M545 Low back pain, unspecified: Secondary | ICD-10-CM

## 2019-09-23 DIAGNOSIS — R2689 Other abnormalities of gait and mobility: Secondary | ICD-10-CM | POA: Diagnosis not present

## 2019-09-23 DIAGNOSIS — G8929 Other chronic pain: Secondary | ICD-10-CM | POA: Diagnosis not present

## 2019-09-23 DIAGNOSIS — M6281 Muscle weakness (generalized): Secondary | ICD-10-CM | POA: Diagnosis not present

## 2019-09-23 NOTE — Patient Instructions (Signed)
Access Code: [email or web address] URL: [email or web address] Date: 09/23/2019 Prepared by: Hilda Blades  Exercises Hooklying Single Knee to Chest Stretch - 1 x daily - 7 x weekly - 3 reps - 20 seconds hold Supine Piriformis Stretch with Foot on Ground - 1 x daily - 7 x weekly - 3 reps - 30 seconds hold Straight Leg Raise - 1 x daily - 7 x weekly - 10 reps - 2 sets Bridge - 1 x daily - 7 x weekly - 10 reps - 2 sets Supine Lower Trunk Rotation - 1 x daily - 7 x weekly - 10 reps - 5 seconds hold Supine 90/90 Alternating Toe Touch - 1 x daily - 7 x weekly - 2 sets - 10 reps Clamshell - 1 x daily - 7 x weekly - 10 reps - 2 sets Sit to Stand without Arm Support - 1 x daily - 7 x weekly - 10 reps - 2 sets Standing Row with Anchored Resistance - 1 x daily - 7 x weekly - 10 reps - 2 sets Shoulder Extension with Band - 1 x daily - 7 x weekly - 10 reps - 2 sets

## 2019-09-23 NOTE — Therapy (Signed)
[place], [place], [address] Phone: [phone]   Fax:  [phone]  Physical Therapy Treatment  Progress Note Reporting Period 07/03/2019 to 09/23/2019  See note below for Objective Data and Assessment of Progress/Goals.    Patient Details  Name: [name] Wright MRN: [id number] Date of Birth: 1940-12-15 Referring Provider (PT): Geoffery Lyons, NP   Encounter Date: 09/23/2019  PT End of Session - 09/23/19 1551    Visit Number  7    Number of Visits  11    Date for PT Re-Evaluation  10/28/19    Authorization Type  MCR A&B    PT Start Time  1530    PT Stop Time  1615    PT Time Calculation (min)  45 min    Activity Tolerance  Patient tolerated treatment well    Behavior During Therapy  [facility] for tasks assessed/performed       Past Medical History:  Diagnosis Date  . BCC (basal cell carcinoma of skin) 06/24/1993   Left Shoulder  . 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 Cancer ([place])    skin and eye  . Coronary artery disease    NUCLEAR STRESS TEST, 09/30/2009 - EKG negative for ischemia  . Diabetes mellitus without complication ([place])    Treated by diet  . Essential tremor    hands, sometimes legs   . Family history of coronary artery disease    Mother died 69  . H/O subconjunctival hemorrhage   . History of blood transfusion   . Hyperlipidemia   . Hypertension   . 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 Myocardial infarction ([place]) 2006  . 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 Retroperitoneal hematoma    S/P  . Shingles   . 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 Superficial nodular basal cell carcinoma (BCC) 03/02/2016   Left Shoulder    Past Surgical History:  Procedure Laterality Date  . COLONOSCOPY    . CORONARY ANGIOPLASTY WITH STENT PLACEMENT   11/05/2004   RCA DES  . DILATION AND CURETTAGE OF UTERUS    . LID LESION EXCISION Left 02/14/2018   Procedure: LEFT LID LESION EXCISION;  Surgeon: Clista Bernhardt, MD;  Location: [place];  Service: Ophthalmology;  Laterality: Left;  . RECONSTRUCTION OF EYELID Left 02/14/2018   Procedure: TOTAL RECONSTRUCTION OF EYELID;  Surgeon: Clista Bernhardt, MD;  Location: [place];  Service: Ophthalmology;  Laterality: Left;  . SKIN FULL THICKNESS GRAFT Left 02/14/2018   Procedure: SKIN GRAFT FULL THICKNESS LEFT EYE;  Surgeon: Clista Bernhardt, MD;  Location: [place];  Service: Ophthalmology;  Laterality: Left;  . TONSILECTOMY/ADENOIDECTOMY WITH MYRINGOTOMY    . TUBAL LIGATION      There were no vitals filed for this visit.  Subjective Assessment - 09/23/19 1548    Subjective  Patient reports she is feeling about the same. She has been relatively inconsistent with her exercises and has not been walking much. She has not been coming to therapy recently due to not feeling well.    Limitations  Lifting;Standing;Walking;House hold activities    How long can you stand comfortably?  < 10 minutes    How long can you walk comfortably?  5-10 minutes    Patient Stated Goals  Be able to  walk better.    Currently in Pain?  Yes    Pain Score  6    5-6/10   Pain Location  Back    Pain Orientation  Lower    Pain Descriptors / Indicators  Aching    Pain Type  Chronic pain    Pain Onset  More than a month ago    Pain Frequency  Intermittent    Aggravating Factors   Walking    Pain Relieving Factors  Rest         [facility] PT Assessment - 09/23/19 0001      Assessment   Medical Diagnosis  Chronic neck and lumbar pain    Referring Provider (PT)  Geoffery Lyons, NP    Next MD Visit  Not scheduled      Precautions   Precautions  None      Restrictions   Weight Bearing Restrictions  No      Balance Screen   Has the patient fallen in the past 6 months  No    Has the patient had a decrease in activity level  because of a fear of falling?   No    Is the patient reluctant to leave their home because of a fear of falling?   No      Prior Function   Level of Independence  Independent with basic ADLs      Cognition   Overall Cognitive Status  Within Functional Limits for tasks assessed      Observation/Other Assessments   Observations  Paient appears in no apparent distress    Focus on Therapeutic Outcomes (FOTO)   52% limitation      Strength   Right Hip Flexion  4-/5    Right Hip ABduction  4-/5    Left Hip Flexion  4-/5    Left Hip ABduction  4-/5    Right Knee Flexion  4/5    Right Knee Extension  4+/5    Left Knee Flexion  4/5    Left Knee Extension  4+/5      Ambulation/Gait   Ambulation/Gait  Yes    Ambulation/Gait Assistance  6: Modified independent (Device/Increase time)    Gait Comments  Decreased gait speed, increased forward flexion with distance                   [facility] Adult PT Treatment/Exercise - 09/23/19 0001      Self-Care   Self-Care  Other Self-Care Comments    Other Self-Care Comments   FOTO, goals and progression with therapy, consistency with exercises, walking progression, rejoining gym and gym exercises      Exercises   Exercises  Lumbar;Neck      Lumbar Exercises: Stretches   Single Knee to Chest Stretch  2 reps;20 seconds    Lower Trunk Rotation Limitations  5 sec hold x10    Piriformis Stretch  2 reps;20 seconds      Lumbar Exercises: Aerobic   Nustep  L1 x 8 min UE/LE      Lumbar Exercises: Seated   Sit to Stand  --   3x6     Lumbar Exercises: Supine   Bridge  10 reps   2 sets   Straight Leg Raise  10 reps   2 sets   Other Supine Lumbar Exercises  Alternating heel taps 3x10 (5 each side)      Lumbar Exercises: Sidelying   Clam  10 reps   2 sets  PT Education - 09/23/19 1550    Education Details  HEP    Person(s) Educated  Patient    Methods  Explanation;Demonstration;Verbal cues;Handout    Comprehension   Verbalized understanding;Returned demonstration;Verbal cues required;Need further instruction       PT Short Term Goals - 09/23/19 1555      PT SHORT TERM GOAL #1   Title  Patient will be I with initial HEP to progress in PT.    Time  4    Period  Weeks    Status  Achieved      PT SHORT TERM GOAL #2   Title  Patient will be able be able to perform dressing and grooming tasks with minimal difficulty.    Time  4    Period  Weeks    Status  Achieved      PT SHORT TERM GOAL #3   Title  Patient will be able to walk >/= 10 minutes with minimal difficulty to allow for improved community access.    Time  3    Period  Weeks    Status  On-going    Target Date  10/21/19        PT Long Term Goals - 09/23/19 1556      PT LONG TERM GOAL #1   Title  Patient will be I in final HEP to maintain progress in PT.    Time  4    Period  Weeks    Status  On-going    Target Date  10/28/19      PT LONG TERM GOAL #2   Title  Patient will exhibit imporved BLE strength of grossly >/= 4/5 MMT to improve ability to go up/down stairs    Baseline  See above    Time  4    Period  Weeks    Status  On-going    Target Date  10/28/19      PT LONG TERM GOAL #3   Title  Patient will report minimal discomfort with walking >/= 15 minutes to allow for ability to go to grocery store with less limitation.    Baseline  See above    Time  4    Period  Weeks    Status  On-going    Target Date  10/28/19      PT LONG TERM GOAL #4   Title  Patient will report improved functional level to </= 49% limitation via FOTO.    Baseline  52% limitation    Time  4    Period  Weeks    Status  On-going    Target Date  10/28/19            Plan - 09/23/19 1553    Clinical Impression Statement  Patient tolerated therapy wel with no adverse effects. She continues to exhibit limitation with walking ability in regard to duration and distance, and she continues to demonstrate core and BLE weakness. She has been  inconsistent with exercises and has not been as active recently. She was encouraged to work on walking short durations during the day and gradually increasing, consistency with exercises, and returning to gym. She would benefit from continued skilled PT to progress mobility and strength to allow for improved walking and activity level.    PT Frequency  1x / week    PT Duration  4 weeks    PT Treatment/Interventions  ADLs/Self Care Home Management;Cryotherapy;Electrical Stimulation;Moist Heat;Gait training;Functional mobility training;Therapeutic activities;Therapeutic exercise;Balance training;Neuromuscular re-education;Manual techniques;Patient/family  education;Passive range of motion;Dry needling;Joint Manipulations;Spinal Manipulations;Taping    PT Next Visit Plan  Assess HEP and progress PRN, generalized LE and core strengthening, periscapualr and postural strengthening    PT Home Exercise Plan  [id number]: Supine SKTC and piriformis stretch, SLR, LTR, bridge, side lying clamshell with yellow, sit-to-stand, banded row and extension with yellow    Consulted and Agree with Plan of Care  Patient       Patient will benefit from skilled therapeutic intervention in order to improve the following deficits and impairments:  Abnormal gait, Decreased range of motion, Decreased activity tolerance, Pain, Postural dysfunction, Decreased strength  Visit Diagnosis: Chronic bilateral low back pain, unspecified whether sciatica present  Cervicalgia  Other abnormalities of gait and mobility  Muscle weakness (generalized)     Problem List Patient Active Problem List   Diagnosis Date Noted  . Aspirin intolerance 06/27/2019  . Failure to thrive (0-17) 02/19/2017  . Shingles right face and eye 02/19/2017  . CAD- RCA DES 2006   . Dyslipidemia, goal LDL below 70   . Essential hypertension   . Non-insulin treated type 2 diabetes mellitus ([place])     Hilda Blades, PT, DPT, LAT, ATC 09/23/19  4:53  PM Phone: [phone] Fax: [facility] [address] [place], [place], [address] Phone: [phone]   Fax:  [phone]  Name: [name] Wright MRN: [id number] Date of Birth: April 21, 1941

## 2019-09-30 DIAGNOSIS — B977 Papillomavirus as the cause of diseases classified elsewhere: Secondary | ICD-10-CM | POA: Diagnosis not present

## 2019-09-30 DIAGNOSIS — N814 Uterovaginal prolapse, unspecified: Secondary | ICD-10-CM | POA: Diagnosis not present

## 2019-09-30 DIAGNOSIS — R87810 Cervical high risk human papillomavirus (HPV) DNA test positive: Secondary | ICD-10-CM | POA: Diagnosis not present

## 2019-10-07 DIAGNOSIS — I10 Essential (primary) hypertension: Secondary | ICD-10-CM | POA: Diagnosis not present

## 2019-10-07 DIAGNOSIS — M8589 Other specified disorders of bone density and structure, multiple sites: Secondary | ICD-10-CM | POA: Diagnosis not present

## 2019-10-07 DIAGNOSIS — M5416 Radiculopathy, lumbar region: Secondary | ICD-10-CM | POA: Diagnosis not present

## 2019-10-07 DIAGNOSIS — F329 Major depressive disorder, single episode, unspecified: Secondary | ICD-10-CM | POA: Diagnosis not present

## 2019-10-07 DIAGNOSIS — E1159 Type 2 diabetes mellitus with other circulatory complications: Secondary | ICD-10-CM | POA: Diagnosis not present

## 2019-10-07 DIAGNOSIS — I700 Atherosclerosis of aorta: Secondary | ICD-10-CM | POA: Diagnosis not present

## 2019-10-07 DIAGNOSIS — R627 Adult failure to thrive: Secondary | ICD-10-CM | POA: Diagnosis not present

## 2019-10-07 DIAGNOSIS — E871 Hypo-osmolality and hyponatremia: Secondary | ICD-10-CM | POA: Diagnosis not present

## 2019-10-07 DIAGNOSIS — E7849 Other hyperlipidemia: Secondary | ICD-10-CM | POA: Diagnosis not present

## 2019-10-07 DIAGNOSIS — R634 Abnormal weight loss: Secondary | ICD-10-CM | POA: Diagnosis not present

## 2019-10-07 DIAGNOSIS — I2510 Atherosclerotic heart disease of native coronary artery without angina pectoris: Secondary | ICD-10-CM | POA: Diagnosis not present

## 2019-10-07 LAB — HEPATIC FUNCTION PANEL
ALT: 21 (ref 7–35)
AST: 13 (ref 13–35)
Alkaline Phosphatase: 80 (ref 25–125)
Bilirubin, Direct: 0.1
Bilirubin, Total: 0.3

## 2019-10-07 LAB — CBC AND DIFFERENTIAL
HCT: 47 — AB (ref 36–46)
Hemoglobin: 14.5 (ref 12.0–16.0)
Platelets: 334 (ref 150–399)
WBC: 11.7

## 2019-10-07 LAB — COMPREHENSIVE METABOLIC PANEL
Albumin: 4.1 (ref 3.5–5.0)
Calcium: 8.8 (ref 8.7–10.7)
GFR calc Af Amer: 97.4
GFR calc non Af Amer: 80.5

## 2019-10-07 LAB — BASIC METABOLIC PANEL
BUN: 16 (ref 4–21)
CO2: 26 — AB (ref 13–22)
Chloride: 99 (ref 99–108)
Creatinine: 0.7 (ref <=1.1)
Glucose: 213
Potassium: 4.2 (ref 3.4–5.3)
Sodium: 134 — AB (ref 137–147)

## 2019-10-07 LAB — TSH: TSH: 1.38 (ref <=5.90)

## 2019-10-07 LAB — CBC: RBC: 4.9 (ref 3.87–5.11)

## 2019-10-07 LAB — LIPID PANEL
Cholesterol: 222 — AB (ref 0–200)
HDL: 52 (ref 35–70)
LDL Cholesterol: 93
Triglycerides: 383 — AB (ref 40–160)

## 2019-10-09 ENCOUNTER — Encounter: Payer: Self-pay | Admitting: Physical Therapy

## 2019-10-09 ENCOUNTER — Ambulatory Visit: Payer: Medicare Other | Admitting: Physical Therapy

## 2019-10-09 ENCOUNTER — Other Ambulatory Visit: Payer: Self-pay

## 2019-10-09 DIAGNOSIS — R2689 Other abnormalities of gait and mobility: Secondary | ICD-10-CM

## 2019-10-09 DIAGNOSIS — M542 Cervicalgia: Secondary | ICD-10-CM

## 2019-10-09 DIAGNOSIS — M6281 Muscle weakness (generalized): Secondary | ICD-10-CM

## 2019-10-09 DIAGNOSIS — G8929 Other chronic pain: Secondary | ICD-10-CM

## 2019-10-09 DIAGNOSIS — M545 Low back pain: Secondary | ICD-10-CM | POA: Diagnosis not present

## 2019-10-09 NOTE — Therapy (Addendum)
[place], [place], [address] Phone: [phone]   Fax:  [phone]  Physical Therapy Treatment / Discharge  Patient Details  Name: [name] Wright MRN: [id number] Date of Birth: 1940/06/28 Referring Provider (PT): Geoffery Lyons, NP   Encounter Date: 10/09/2019  PT End of Session - 10/09/19 1136    Visit Number  8    Number of Visits  11    Date for PT Re-Evaluation  10/28/19    Authorization Type  MCR A&B    PT Start Time  1130    PT Stop Time  1212    PT Time Calculation (min)  42 min    Activity Tolerance  Patient tolerated treatment well    Behavior During Therapy  [facility] for tasks assessed/performed       Past Medical History:  Diagnosis Date  . BCC (basal cell carcinoma of skin) 06/24/1993   Left Shoulder  . 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 Cancer ([place])    skin and eye  . Coronary artery disease    NUCLEAR STRESS TEST, 09/30/2009 - EKG negative for ischemia  . Diabetes mellitus without complication ([place])    Treated by diet  . Essential tremor    hands, sometimes legs   . Family history of coronary artery disease    Mother died 73  . H/O subconjunctival hemorrhage   . History of blood transfusion   . Hyperlipidemia   . Hypertension   . 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 Myocardial infarction ([place]) 2006  . 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 Retroperitoneal hematoma    S/P  . Shingles   . 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 Superficial nodular basal cell carcinoma (BCC) 03/02/2016   Left Shoulder    Past Surgical History:  Procedure Laterality Date  . COLONOSCOPY    . CORONARY ANGIOPLASTY WITH STENT PLACEMENT  11/05/2004   RCA DES  . DILATION AND CURETTAGE OF UTERUS    . LID LESION EXCISION Left 02/14/2018   Procedure: LEFT LID  LESION EXCISION;  Surgeon: Clista Bernhardt, MD;  Location: [facility];  Service: Ophthalmology;  Laterality: Left;  . RECONSTRUCTION OF EYELID Left 02/14/2018   Procedure: TOTAL RECONSTRUCTION OF EYELID;  Surgeon: Clista Bernhardt, MD;  Location: [facility];  Service: Ophthalmology;  Laterality: Left;  . SKIN FULL THICKNESS GRAFT Left 02/14/2018   Procedure: SKIN GRAFT FULL THICKNESS LEFT EYE;  Surgeon: Clista Bernhardt, MD;  Location: [facility];  Service: Ophthalmology;  Laterality: Left;  . TONSILECTOMY/ADENOIDECTOMY WITH MYRINGOTOMY    . TUBAL LIGATION      There were no vitals filed for this visit.  Subjective Assessment - 10/09/19 1133    Subjective  Patient reports she is feeling about the same. She has been dealing with shingles since 2018 and it is still bothering her.    Patient Stated Goals  Be able to walk better.    Currently in Pain?  Yes    Pain Score  5     Pain Location  Back    Pain Orientation  Lower    Pain Descriptors / Indicators  Aching    Pain Type  Chronic pain    Pain Onset  More than a month ago    Pain Frequency  Intermittent    Aggravating Factors   Walking                       [facility] Adult PT Treatment/Exercise - 10/09/19 0001      Exercises   Exercises  Lumbar;Neck      Lumbar Exercises: Stretches   Single Knee to Chest Stretch  2 reps;20 seconds    Lower Trunk Rotation Limitations  5 sec hold x10 each with cross legged    Piriformis Stretch  2 reps;20 seconds      Lumbar Exercises: Aerobic   Nustep  L1 x 6 min UE/LE      Lumbar Exercises: Standing   Heel Raises  10 reps   2 sets   Other Standing Lumbar Exercises  Hip abduction, extension, and marching with UE support 2x10 each      Lumbar Exercises: Seated   Long Arc Quad on Chair  2 sets;10 reps    LAQ on Chair Weights (lbs)  2    Sit to Stand  5 reps   4 sets   Sit to Stand Limitations  Last 3 sets holding 5# kettlebell at chest      Lumbar Exercises: Supine   Bridge  10 reps    2 sets   Straight Leg Raise  10 reps   2 sets            PT Education - 10/09/19 1136    Education Details  HEP consistency    Person(s) Educated  Patient    Methods  Explanation;Demonstration;Verbal cues    Comprehension  Verbalized understanding;Returned demonstration;Verbal cues required;Need further instruction       PT Short Term Goals - 09/23/19 1555      PT SHORT TERM GOAL #1   Title  Patient will be I with initial HEP to progress in PT.    Time  4    Period  Weeks    Status  Achieved      PT SHORT TERM GOAL #2   Title  Patient will be able be able to perform dressing and grooming tasks with minimal difficulty.    Time  4    Period  Weeks    Status  Achieved      PT SHORT TERM GOAL #3   Title  Patient will be able to walk >/= 10 minutes with minimal difficulty to allow for improved community access.    Time  3    Period  Weeks    Status  On-going    Target Date  10/21/19        PT Long Term Goals - 09/23/19 1556      PT LONG TERM GOAL #1   Title  Patient will be I in final HEP to maintain progress in PT.    Time  4    Period  Weeks    Status  On-going    Target Date  10/28/19      PT LONG TERM GOAL #2   Title  Patient will exhibit imporved BLE strength of grossly >/= 4/5 MMT to improve ability to go up/down stairs    Baseline  See above    Time  4    Period  Weeks    Status  On-going    Target Date  10/28/19      PT LONG TERM GOAL #3   Title  Patient will report minimal discomfort with walking >/= 15 minutes to allow for  ability to go to grocery store with less limitation.    Baseline  See above    Time  4    Period  Weeks    Status  On-going    Target Date  10/28/19      PT LONG TERM GOAL #4   Title  Patient will report improved functional level to </= 49% limitation via FOTO.    Baseline  52% limitation    Time  4    Period  Weeks    Status  On-going    Target Date  10/28/19            Plan - 10/09/19 1137    Clinical  Impression Statement  Patient tolerated therapy well with no adverse effects. She did not report any increase in low back pain with therapy with visit and states that she does feel better following exercise but the pain will come back. Reintroduced standing exercises and lifting this visit with good tolerance. She did require cueing for proper mechanics and technique with most her exercises. She was encouraged to work on walking short durations during the day and gradually increasing time, and was educated at home recumbenet exercise machines as patient expressed interest in this rather than going back to gym. She would benefit from continued skilled PT to progress mobility and strength to allow for improved walking and activity level.    PT Treatment/Interventions  ADLs/Self Care Home Management;Cryotherapy;Electrical Stimulation;Moist Heat;Gait training;Functional mobility training;Therapeutic activities;Therapeutic exercise;Balance training;Neuromuscular re-education;Manual techniques;Patient/family education;Passive range of motion;Dry needling;Joint Manipulations;Spinal Manipulations;Taping    PT Next Visit Plan  Assess HEP and progress PRN, generalized LE and core strengthening, periscapualr and postural strengthening    PT Home Exercise Plan  [id number]: Supine SKTC and piriformis stretch, SLR, LTR, bridge, sit-to-stand, banded row and extension with yellow, standing hip abduction, extension, marching, heel raises    Consulted and Agree with Plan of Care  Patient       Patient will benefit from skilled therapeutic intervention in order to improve the following deficits and impairments:  Abnormal gait, Decreased range of motion, Decreased activity tolerance, Pain, Postural dysfunction, Decreased strength  Visit Diagnosis: Chronic bilateral low back pain, unspecified whether sciatica present  Cervicalgia  Other abnormalities of gait and mobility  Muscle weakness (generalized)     Problem  List Patient Active Problem List   Diagnosis Date Noted  . Aspirin intolerance 06/27/2019  . Failure to thrive (0-17) 02/19/2017  . Shingles right face and eye 02/19/2017  . CAD- RCA DES 2006   . Dyslipidemia, goal LDL below 70   . Essential hypertension   . Non-insulin treated type 2 diabetes mellitus ([place])     Hilda Blades, PT, DPT, LAT, ATC 10/09/19  12:14 PM Phone: [phone] Fax: [facility] [address] [place], [place], [address] Phone: [phone]   Fax:  [phone]  Name: [name] Wright MRN: [id number] Date of Birth: [birth year]  PHYSICAL THERAPY DISCHARGE SUMMARY  Visits from Start of Care: 8  Current functional level related to goals / functional outcomes: See above   Remaining deficits: See above   Education / Equipment: HEP Plan: Patient agrees to discharge.  Patient goals were partially met. Patient is being discharged due to being pleased with the current functional level.  ?????    Hilda Blades, PT, DPT, LAT, ATC 10/23/19  2:43 PM Phone: [phone] Fax: [phone]

## 2019-10-09 NOTE — Patient Instructions (Signed)
Access Code: [email or web address] URL: [email or web address] Date: 10/09/2019 Prepared by: Hilda Blades  Exercises Hooklying Single Knee to Chest Stretch - 1 x daily - 7 x weekly - 3 reps - 20 seconds hold Supine Piriformis Stretch with Foot on Ground - 1 x daily - 7 x weekly - 3 reps - 30 seconds hold Supine Lower Trunk Rotation - 1 x daily - 7 x weekly - 10 reps - 5 seconds hold Straight Leg Raise - 1 x daily - 7 x weekly - 10 reps - 2 sets Bridge - 1 x daily - 7 x weekly - 10 reps - 2 sets Supine 90/90 Alternating Toe Touch - 1 x daily - 7 x weekly - 2 sets - 10 reps Sit to Stand without Arm Support - 1 x daily - 7 x weekly - 10 reps - 2 sets Standing Row with Anchored Resistance - 1 x daily - 7 x weekly - 10 reps - 2 sets Shoulder Extension with Band - 1 x daily - 7 x weekly - 10 reps - 2 sets Standing Hip Abduction with Counter Support - 1 x daily - 7 x weekly - 2 sets - 10 reps Standing Hip Extension with Counter Support - 1 x daily - 7 x weekly - 2 sets - 10 reps Standing March with Counter Support - 1 x daily - 7 x weekly - 2 sets - 10 reps Heel rises with counter support - 1 x daily - 7 x weekly - 2 sets - 10 reps

## 2019-10-16 ENCOUNTER — Ambulatory Visit: Payer: Medicare Other | Admitting: Physical Therapy

## 2019-10-21 DIAGNOSIS — B0230 Zoster ocular disease, unspecified: Secondary | ICD-10-CM | POA: Diagnosis not present

## 2019-10-21 DIAGNOSIS — E119 Type 2 diabetes mellitus without complications: Secondary | ICD-10-CM | POA: Diagnosis not present

## 2019-10-21 LAB — HM DIABETES EYE EXAM

## 2019-10-23 ENCOUNTER — Ambulatory Visit: Payer: Medicare Other | Attending: Family Medicine | Admitting: Physical Therapy

## 2019-10-23 ENCOUNTER — Telehealth: Payer: Self-pay | Admitting: Physical Therapy

## 2019-10-23 NOTE — Telephone Encounter (Signed)
Patient was contacted due to missing her PT appointment. This was scheduled to be her last appointment and patient stated that she feels she is fine to be discharged because she feels comfortable with her exercise program at home. Patient was instructed that she would be officially discharged from therapy and if she needed to schedule more appointments she would need a new PT referral. Patient expressed understanding.

## 2019-11-18 ENCOUNTER — Other Ambulatory Visit: Payer: Self-pay

## 2019-11-18 ENCOUNTER — Telehealth: Payer: Self-pay

## 2019-11-18 MED ORDER — NITROGLYCERIN 0.4 MG SL SUBL: 0.4000 mg | SUBLINGUAL_TABLET | SUBLINGUAL | 3 refills | Status: AC | PRN

## 2019-12-19 ENCOUNTER — Other Ambulatory Visit: Payer: Self-pay

## 2020-04-21 ENCOUNTER — Ambulatory Visit (INDEPENDENT_AMBULATORY_CARE_PROVIDER_SITE_OTHER): Payer: Medicare Other | Admitting: Orthopedic Surgery

## 2020-04-21 ENCOUNTER — Encounter: Payer: Self-pay | Admitting: Orthopedic Surgery

## 2020-04-21 ENCOUNTER — Other Ambulatory Visit: Payer: Self-pay

## 2020-04-21 VITALS — BP 130/68 | HR 99 | Temp 96.8°F | Resp 20 | Ht 62.0 in | Wt 87.6 lb

## 2020-04-21 DIAGNOSIS — Z23 Encounter for immunization: Secondary | ICD-10-CM

## 2020-04-21 DIAGNOSIS — E1165 Type 2 diabetes mellitus with hyperglycemia: Secondary | ICD-10-CM | POA: Diagnosis not present

## 2020-04-21 DIAGNOSIS — E785 Hyperlipidemia, unspecified: Secondary | ICD-10-CM

## 2020-04-21 DIAGNOSIS — R634 Abnormal weight loss: Secondary | ICD-10-CM | POA: Insufficient documentation

## 2020-04-21 DIAGNOSIS — Z780 Asymptomatic menopausal state: Secondary | ICD-10-CM

## 2020-04-21 DIAGNOSIS — G8929 Other chronic pain: Secondary | ICD-10-CM | POA: Insufficient documentation

## 2020-04-21 DIAGNOSIS — IMO0002 Reserved for concepts with insufficient information to code with codable children: Secondary | ICD-10-CM

## 2020-04-21 DIAGNOSIS — R54 Age-related physical debility: Secondary | ICD-10-CM | POA: Diagnosis not present

## 2020-04-21 DIAGNOSIS — R5383 Other fatigue: Secondary | ICD-10-CM

## 2020-04-21 DIAGNOSIS — R490 Dysphonia: Secondary | ICD-10-CM

## 2020-04-21 DIAGNOSIS — Z8619 Personal history of other infectious and parasitic diseases: Secondary | ICD-10-CM | POA: Diagnosis not present

## 2020-04-21 DIAGNOSIS — E1169 Type 2 diabetes mellitus with other specified complication: Secondary | ICD-10-CM | POA: Diagnosis not present

## 2020-04-21 DIAGNOSIS — I10 Essential (primary) hypertension: Secondary | ICD-10-CM | POA: Diagnosis not present

## 2020-04-21 DIAGNOSIS — I270 Primary pulmonary hypertension: Secondary | ICD-10-CM | POA: Diagnosis not present

## 2020-04-21 DIAGNOSIS — I25811 Atherosclerosis of native coronary artery of transplanted heart without angina pectoris: Secondary | ICD-10-CM

## 2020-04-21 DIAGNOSIS — E1143 Type 2 diabetes mellitus with diabetic autonomic (poly)neuropathy: Secondary | ICD-10-CM

## 2020-04-21 DIAGNOSIS — M545 Other chronic pain: Secondary | ICD-10-CM

## 2020-04-21 DIAGNOSIS — Z85828 Personal history of other malignant neoplasm of skin: Secondary | ICD-10-CM | POA: Diagnosis not present

## 2020-04-21 NOTE — Progress Notes (Signed)
Careteam: Patient Care Team: [name], MD as PCP - General (Endocrinology) Lorretta Harp, MD as PCP - Cardiology (Cardiology)   Seen by: Windell Moulding, AGNP-C  PLACE OF SERVICE:  [facility] Directive information    Allergies  Allergen Reactions  . Avelox [Moxifloxacin Hcl In Nacl] Anaphylaxis, Nausea Only, Swelling and Other (See Comments)    Throat and tongue swelling  . Altace [Ramipril] Other (See Comments)    Tremors and increased tinnitus  . Hctz [Hydrochlorothiazide] Other (See Comments)    Palpitations and tremors  . Lisinopril Other (See Comments)    Headaches  . Losartan Other (See Comments)    Palpitations and tremors  . Statins Other (See Comments)    Increased blood pressure  . Sulfa Antibiotics Rash    No chief complaint on file.    HPI: Patient is a 79 y.o. female presents today to establish care as a new patient.    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   Son present for visit.   Previously a patient of Dr. [name]. Last seen about 4 months ago. Upset about how her weight loss issues were not being addressed.   Mainly concerned about weight loss.Has lost 30lbs within the past year. Admits new medication Streglatro caused more weight gain. Eats about 2 meals a day. Drinks 2 bosts daily. Tries to get glucose control ones when they can find them. Does not stay hydrated well. States she has a poor appetite, but tries to eat often.   Daughter and son live with her.   She fell about 4 months ago. Bruised left hip, was not hospitalized. Denies current hip pain. Uses four wheeled walker at home and wheelchair for long distances. Home free of loose rugs and cords. Does not have raised toilet seat. Has shower chair. Grab bar in shower.   Back pain- chronic. Will take tylenol PRN for pain. Denies numbness down legs. She has episodes of neuropathy, believes it is associated with diabetes.   High blood pressure- diagnosed 25-30  years ago. Takes amlodipine daily. Tries to limit salt.  Does not check blood pressure unless feeling unwell.   Takes statin medication daily for high cholesterol. Denies any side effects from medication. Has past allergy to Lipitor.   Diabetes- diagnosed about 10 years ago. Taking metformin and Steglato. Does not follow diabetic diet since weight loss.   Basal cell carcinoma- Has had several places on her right forearm and chest removed. Not seen dermatologist in over a year. Only complains of dry skin today.   Heart attack- occurred in 2006. No other incidents since then. Followed by Dr. Joya Gaskins. 06/2019 she was taken of beta blocker and plavix. Denies chest pain. Has nitroglycerin at home.   Short of breath. Episodes occur with exertion or with her seatbelt in car. Thinks increased fragility has made her out of shape. Does not use oxygen.   Occular shingles in 2018. Was hospitalized. Has been vaccinated with Shingrix vaccine.   Sleeps about 10-12 hours a night. Wakes up once a night to urinate. No incontinence issues.   Does not get mammograms anymore.   Still drives to [employer] for breakfast and gets oatmeal everyday. Only time she drives.   She has a long history of second hand smoke exposure. Her husband smokes in the house. Cannot recall how many years.   In the past year her voice has changed She is also having trouble swallowing. Would like this addressed  today. No history of aspiration.   Last eye exam with Dr. Alanda Slim in 10/2019. Past history of cataract surgery.   Sees dentist once a year. Still has all her teeth. Denies dental pain or broken teeth. Did not go this year because of trouble swallowing.   Interested in getting flu vaccine today.   Moves bowels a few times a week. Uses suppositories and laxatives to help. Denies incontinence.      Review of Systems:  Review of Systems  Constitutional: Positive for malaise/fatigue and weight loss. Negative for fever.        Loss of appetitie  HENT: Negative for congestion, hearing loss and sore throat.        Trouble swallowing  Eyes: Positive for pain and discharge.  Respiratory: Positive for shortness of breath. Negative for cough and wheezing.   Cardiovascular: Negative for chest pain and leg swelling.  Gastrointestinal: Positive for constipation. Negative for nausea and vomiting.       Hemorrhoids   Genitourinary: Negative for dysuria and hematuria.       Frequent UTI's  Musculoskeletal: Positive for back pain.       Joint stiffness  Skin:       Dry skin  Neurological: Positive for tingling and weakness.  Endo/Heme/Allergies: Negative for polydipsia.  Psychiatric/Behavioral: Positive for depression. Negative for memory loss. The patient is not nervous/anxious.     Past Medical History:  Diagnosis Date  . BCC (basal cell carcinoma of skin) 06/24/1993   Left Shoulder  . 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 Cancer ([place])    skin and eye  . Coronary artery disease    NUCLEAR STRESS TEST, 09/30/2009 - EKG negative for ischemia  . Diabetes mellitus without complication ([place])    Treated by diet  . Essential tremor    hands, sometimes legs   . Family history of coronary artery disease    Mother died 71  . H/O subconjunctival hemorrhage   . Heart attack ([place])   . History of blood transfusion   . Hyperlipidemia   . Hypertension   . 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 Myocardial infarction ([place]) 2006  . 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 Presence of pessary   . Retroperitoneal hematoma    S/P  . Shingles   . 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 Superficial nodular basal cell carcinoma (BCC) 03/02/2016   Left Shoulder   Past Surgical History:  Procedure Laterality Date  . COLONOSCOPY    . CORONARY ANGIOPLASTY WITH STENT PLACEMENT  11/05/2004   RCA DES  . DILATION AND CURETTAGE OF UTERUS    . LID  LESION EXCISION Left 02/14/2018   Procedure: LEFT LID LESION EXCISION;  Surgeon: Clista Bernhardt, MD;  Location: [facility];  Service: Ophthalmology;  Laterality: Left;  . RECONSTRUCTION OF EYELID Left 02/14/2018   Procedure: TOTAL RECONSTRUCTION OF EYELID;  Surgeon: Clista Bernhardt, MD;  Location: [facility];  Service: Ophthalmology;  Laterality: Left;  . SKIN FULL THICKNESS GRAFT Left 02/14/2018   Procedure: SKIN GRAFT FULL THICKNESS LEFT EYE;  Surgeon: Clista Bernhardt, MD;  Location: [facility];  Service: Ophthalmology;  Laterality: Left;  . TONSILECTOMY/ADENOIDECTOMY WITH MYRINGOTOMY    . TUBAL LIGATION     Social History:   reports that she has never smoked. She has never used smokeless tobacco. She reports current alcohol use. She  reports that she does not use drugs.  Family History  Problem Relation Age of Onset  . Heart attack Mother 4  . Cancer Father        Colon  . Heart attack Maternal Grandmother 61  . High blood pressure Son   . High Cholesterol Son   . Depression Son   . Depression Daughter   . Depression Daughter   . Depression Son     Medications: Patient's Medications  New Prescriptions   No medications on file  Previous Medications   ACETAMINOPHEN (TYLENOL) 500 MG TABLET    Take 1,000 mg by mouth as needed.   AMLODIPINE (NORVASC) 2.5 MG TABLET    Take 1 tablet (2.5 mg total) by mouth daily.   ASPIRIN BUF,CACARB-MGCARB-MGO, 81 MG TABS    daily.   COENZYME Q10 100 MG CAPSULE    Take 100 mg by mouth daily.    DIFLUPREDNATE (DUREZOL) 0.05 % EMUL    Place 1 drop into the right eye daily.    DULOXETINE (CYMBALTA) 30 MG CAPSULE    Take 30 mg by mouth 2 (two) times daily.    ERTUGLIFLOZIN L-PYROGLUTAMICAC (STEGLATRO) 5 MG TABS    Take 5 mg by mouth daily.   FLUTICASONE (FLONASE) 50 MCG/ACT NASAL SPRAY    Place 2 sprays into both nostrils daily as needed for allergies or rhinitis.   LORATADINE (CLARITIN) 10 MG TABLET    Take 10 mg by mouth every other day.    METFORMIN  (GLUCOPHAGE-XR) 500 MG 24 HR TABLET    Take 1,000 mg by mouth 2 (two) times daily.    NITROGLYCERIN (NITROSTAT) 0.4 MG SL TABLET    Place 1 tablet (0.4 mg total) under the tongue every 5 (five) minutes as needed for chest pain.   PRAVASTATIN (PRAVACHOL) 20 MG TABLET    Take 1 tablet (20 mg total) by mouth every evening.   PROPYLENE GLYCOL (SYSTANE BALANCE) 0.6 % SOLN    Place 1 drop into both eyes as needed.   Modified Medications   No medications on file  Discontinued Medications   No medications on file    Physical Exam:  There were no vitals filed for this visit. There is no height or weight on file to calculate BMI. Wt Readings from Last 3 Encounters:  06/27/19 117 lb 11.2 oz (53.4 kg)  12/20/18 126 lb (57.2 kg)  04/19/18 139 lb 6.4 oz (63.2 kg)    Physical Exam Vitals reviewed.  Constitutional:      Appearance: Normal appearance.  HENT:     Head: Normocephalic.     Right Ear: There is no impacted cerumen.     Left Ear: There is no impacted cerumen.     Mouth/Throat:     Mouth: Mucous membranes are moist.     Pharynx: No oropharyngeal exudate or posterior oropharyngeal erythema.  Neck:     Thyroid: No thyroid mass, thyromegaly or thyroid tenderness.  Cardiovascular:     Rate and Rhythm: Regular rhythm. Tachycardia present.     Pulses:          Dorsalis pedis pulses are 1+ on the right side and 1+ on the left side.       Posterior tibial pulses are 1+ on the right side and 1+ on the left side.     Heart sounds: Murmur heard.   Pulmonary:     Effort: Pulmonary effort is normal. No respiratory distress.     Breath sounds: Normal breath  sounds. No wheezing.  Abdominal:     General: Abdomen is flat. Bowel sounds are normal. There is no distension.     Palpations: Abdomen is soft.  Musculoskeletal:     Cervical back: Full passive range of motion without pain.     Right lower leg: No edema.     Left lower leg: No edema.     Right foot: Normal range of motion.     Left  foot: Normal range of motion.     Comments: Back kyphotic  Feet:     Right foot:     Protective Sensation: 10 sites tested. 10 sites sensed.     Skin integrity: Skin integrity normal.     Toenail Condition: Right toenails are normal.     Left foot:     Protective Sensation: 10 sites tested. 10 sites sensed.     Skin integrity: Skin integrity normal.     Toenail Condition: Left toenails are normal.     Comments: Bilateral feet cool to touch Lymphadenopathy:     Cervical: No cervical adenopathy.  Skin:    General: Skin is warm and dry.     Capillary Refill: Capillary refill takes 2 to 3 seconds.  Neurological:     General: No focal deficit present.     Mental Status: She is alert and oriented to person, place, and time.  Psychiatric:        Mood and Affect: Mood normal.        Behavior: Behavior normal.    Labs reviewed: Basic Metabolic Panel: No results for input(s): NA, K, CL, CO2, GLUCOSE, BUN, CREATININE, CALCIUM, MG, PHOS, TSH in the last 8760 hours. Liver Function Tests: No results for input(s): AST, ALT, ALKPHOS, BILITOT, PROT, ALBUMIN in the last 8760 hours. No results for input(s): LIPASE, AMYLASE in the last 8760 hours. No results for input(s): AMMONIA in the last 8760 hours. CBC: No results for input(s): WBC, NEUTROABS, HGB, HCT, MCV, PLT in the last 8760 hours. Lipid Panel: No results for input(s): CHOL, HDL, LDLCALC, TRIG, CHOLHDL, LDLDIRECT in the last 8760 hours. TSH: No results for input(s): TSH in the last 8760 hours. A1C: Lab Results  Component Value Date   HGBA1C 8.7 (H) 02/14/2018     Assessment/Plan 1. 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 Hoarseness of voice -  ongoing, voice hoarse during exam - she also is having issues swallowing at times - exam of neck and throat unremarkable - will order ultrasound of neck to look for abnormalities, may consider barium swallow study in future - TSH- today - US Soft Tissue Head/Neck (NON-THYROID); Future  3. Weight loss, unintentional - ongoing, BMI 16 - recommend between meals supplement shakes like MedPass or high calorie boost twice a day - recommend eating more than 1800 calories daily  4. Fatigue, unspecified type - ongoing, suspect she is tired due to low calorie intake - recommend eating more than 1800 calories daily  5. Essential hypertension  - stable, followed by cardiology - bp at goal of <150/90 - CBC with Differential/Platelets- today - continue current medication regimen  - continue low sodium diet  6. Hyperlipidemia associated with type 2 diabetes mellitus ([place]) - stable at  this time with medication - Hepatic Function Panel- today - lipid panel- future  7. Need for influenza vaccination - Flu Vaccine QUAD High Dose(Fluad)  8. Postmenopausal - last bone density study in 2013, will order today to obtain new Tscore - DG Bone Density; Future  9. History of basal cell carcinoma - stable at this time, no new issues reported  10. History of shingles - stable at this time, has received Shingrix vaccine series in 2019  11. 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 Coronary artery disease involving native coronary artery of native heart without angina pectoris  - followed by cardiology - stable at this time - continue current bp and statin medication regimen - lipid panel- future  13. Chronic low bask pain, without sciatica, unspecified back pain laterality - stable at this time, suspect spinal stenosis given her  age - will consider work in future - may continue to use tylenol PRN for pain    Next appt: 1 month follow up recommended Labs: CBC/Diff, CMP, TSH, hepatic, Hemoglobin A1C [name] Wright [name] Wright  [name] Wright [phone]

## 2020-04-21 NOTE — Patient Instructions (Addendum)

## 2020-04-22 LAB — CBC WITH DIFFERENTIAL/PLATELET
Absolute Monocytes: 824 cells/uL (ref 200–950)
Basophils Absolute: 35 cells/uL (ref 0–200)
Basophils Relative: 0.3 %
Eosinophils Absolute: 35 cells/uL (ref 15–500)
Eosinophils Relative: 0.3 %
HCT: 45.9 % — ABNORMAL HIGH (ref 35.0–45.0)
Hemoglobin: 15.3 g/dL (ref 11.7–15.5)
Lymphs Abs: 2192 cells/uL (ref 850–3900)
MCH: 29.7 pg (ref 27.0–33.0)
MCHC: 33.3 g/dL (ref 32.0–36.0)
MCV: 89 fL (ref 80.0–100.0)
MPV: 10.7 fL (ref 7.5–12.5)
Monocytes Relative: 7.1 %
Neutro Abs: 8514 cells/uL — ABNORMAL HIGH (ref 1500–7800)
Neutrophils Relative %: 73.4 %
Platelets: 410 10*3/uL — ABNORMAL HIGH (ref 140–400)
RBC: 5.16 10*6/uL — ABNORMAL HIGH (ref 3.80–5.10)
RDW: 11.9 % (ref 11.0–15.0)
Total Lymphocyte: 18.9 %
WBC: 11.6 10*3/uL — ABNORMAL HIGH (ref 3.8–10.8)

## 2020-04-22 LAB — COMPREHENSIVE METABOLIC PANEL
AG Ratio: 2.1 (calc) (ref 1.0–2.5)
ALT: 13 U/L (ref 6–29)
AST: 14 U/L (ref 10–35)
Albumin: 4.6 g/dL (ref 3.6–5.1)
Alkaline phosphatase (APISO): 72 U/L (ref 37–153)
BUN/Creatinine Ratio: 31 (calc) — ABNORMAL HIGH (ref 6–22)
BUN: 13 mg/dL (ref 7–25)
CO2: 28 mmol/L (ref 20–32)
Calcium: 9.8 mg/dL (ref 8.6–10.4)
Chloride: 98 mmol/L (ref 98–110)
Creat: 0.42 mg/dL — ABNORMAL LOW (ref 0.60–0.88)
Globulin: 2.2 g/dL (calc) (ref 1.9–3.7)
Glucose, Bld: 161 mg/dL — ABNORMAL HIGH (ref 65–139)
Potassium: 4 mmol/L (ref 3.5–5.3)
Sodium: 137 mmol/L (ref 135–146)
Total Bilirubin: 0.4 mg/dL (ref 0.2–1.2)
Total Protein: 6.8 g/dL (ref 6.1–8.1)

## 2020-04-22 LAB — HEMOGLOBIN A1C
Hgb A1c MFr Bld: 7.7 % of total Hgb — ABNORMAL HIGH (ref <5.7)
Mean Plasma Glucose: 174 (calc)
eAG (mmol/L): 9.7 (calc)

## 2020-04-22 LAB — HEPATIC FUNCTION PANEL
AG Ratio: 2.1 (calc) (ref 1.0–2.5)
ALT: 13 U/L (ref 6–29)
AST: 14 U/L (ref 10–35)
Albumin: 4.6 g/dL (ref 3.6–5.1)
Alkaline phosphatase (APISO): 72 U/L (ref 37–153)
Bilirubin, Direct: 0.1 mg/dL (ref 0.0–0.2)
Globulin: 2.2 g/dL (calc) (ref 1.9–3.7)
Indirect Bilirubin: 0.3 mg/dL (calc) (ref 0.2–1.2)
Total Bilirubin: 0.4 mg/dL (ref 0.2–1.2)
Total Protein: 6.8 g/dL (ref 6.1–8.1)

## 2020-04-22 LAB — TSH: TSH: 2.01 mIU/L (ref 0.40–4.50)

## 2020-04-23 ENCOUNTER — Other Ambulatory Visit: Payer: Self-pay

## 2020-04-23 DIAGNOSIS — E1169 Type 2 diabetes mellitus with other specified complication: Secondary | ICD-10-CM

## 2020-04-23 DIAGNOSIS — R54 Age-related physical debility: Secondary | ICD-10-CM

## 2020-04-23 DIAGNOSIS — R5383 Other fatigue: Secondary | ICD-10-CM

## 2020-04-27 ENCOUNTER — Other Ambulatory Visit: Payer: Medicare Other

## 2020-04-29 LAB — COMPREHENSIVE METABOLIC PANEL: Albumin: 3.7 (ref 3.5–5.0)

## 2020-05-04 ENCOUNTER — Ambulatory Visit: Payer: Self-pay | Admitting: Nurse Practitioner

## 2020-05-04 ENCOUNTER — Other Ambulatory Visit: Payer: Medicare Other

## 2020-05-11 ENCOUNTER — Other Ambulatory Visit: Payer: Self-pay | Admitting: Orthopedic Surgery

## 2020-05-11 ENCOUNTER — Ambulatory Visit
Admission: RE | Admit: 2020-05-11 | Discharge: 2020-05-11 | Disposition: A | Discharge status: Home / Self Care | Payer: Medicare Other | Source: Ambulatory Visit | Attending: Orthopedic Surgery | Admitting: Orthopedic Surgery

## 2020-05-11 DIAGNOSIS — R1310 Dysphagia, unspecified: Secondary | ICD-10-CM | POA: Diagnosis not present

## 2020-05-11 DIAGNOSIS — R490 Dysphonia: Secondary | ICD-10-CM

## 2020-05-26 ENCOUNTER — Ambulatory Visit (INDEPENDENT_AMBULATORY_CARE_PROVIDER_SITE_OTHER): Payer: Medicare Other | Admitting: Orthopedic Surgery

## 2020-05-26 ENCOUNTER — Other Ambulatory Visit: Payer: Self-pay

## 2020-05-26 ENCOUNTER — Emergency Department (HOSPITAL_COMMUNITY): Payer: Medicare Other

## 2020-05-26 ENCOUNTER — Encounter: Payer: Self-pay | Admitting: Orthopedic Surgery

## 2020-05-26 ENCOUNTER — Encounter (HOSPITAL_COMMUNITY): Payer: Self-pay

## 2020-05-26 ENCOUNTER — Inpatient Hospital Stay (HOSPITAL_COMMUNITY)
Admission: EM | Admit: 2020-05-26 | Discharge: 2020-05-30 | DRG: 391 | Disposition: A | Discharge status: Home Health | Payer: Medicare Other | Source: Ambulatory Visit | Attending: Internal Medicine | Admitting: Internal Medicine

## 2020-05-26 VITALS — BP 130/76 | HR 110 | Temp 96.8°F | Resp 20 | Ht 62.0 in | Wt 84.0 lb

## 2020-05-26 DIAGNOSIS — Z20822 Contact with and (suspected) exposure to covid-19: Secondary | ICD-10-CM | POA: Diagnosis not present

## 2020-05-26 DIAGNOSIS — E1165 Type 2 diabetes mellitus with hyperglycemia: Secondary | ICD-10-CM | POA: Diagnosis not present

## 2020-05-26 DIAGNOSIS — Z681 Body mass index (BMI) 19 or less, adult: Secondary | ICD-10-CM

## 2020-05-26 DIAGNOSIS — I712 Thoracic aortic aneurysm, without rupture: Secondary | ICD-10-CM | POA: Diagnosis present

## 2020-05-26 DIAGNOSIS — R918 Other nonspecific abnormal finding of lung field: Secondary | ICD-10-CM

## 2020-05-26 DIAGNOSIS — E43 Unspecified severe protein-calorie malnutrition: Secondary | ICD-10-CM | POA: Diagnosis present

## 2020-05-26 DIAGNOSIS — R490 Dysphonia: Secondary | ICD-10-CM | POA: Diagnosis not present

## 2020-05-26 DIAGNOSIS — I10 Essential (primary) hypertension: Secondary | ICD-10-CM | POA: Diagnosis present

## 2020-05-26 DIAGNOSIS — Z7984 Long term (current) use of oral hypoglycemic drugs: Secondary | ICD-10-CM

## 2020-05-26 DIAGNOSIS — R627 Adult failure to thrive: Secondary | ICD-10-CM | POA: Diagnosis not present

## 2020-05-26 DIAGNOSIS — Z955 Presence of coronary angioplasty implant and graft: Secondary | ICD-10-CM

## 2020-05-26 DIAGNOSIS — Z888 Allergy status to other drugs, medicaments and biological substances status: Secondary | ICD-10-CM

## 2020-05-26 DIAGNOSIS — Z7982 Long term (current) use of aspirin: Secondary | ICD-10-CM

## 2020-05-26 DIAGNOSIS — Z7722 Contact with and (suspected) exposure to environmental tobacco smoke (acute) (chronic): Secondary | ICD-10-CM | POA: Diagnosis present

## 2020-05-26 DIAGNOSIS — G1229 Other motor neuron disease: Secondary | ICD-10-CM

## 2020-05-26 DIAGNOSIS — K219 Gastro-esophageal reflux disease without esophagitis: Secondary | ICD-10-CM | POA: Diagnosis present

## 2020-05-26 DIAGNOSIS — I252 Old myocardial infarction: Secondary | ICD-10-CM

## 2020-05-26 DIAGNOSIS — R1312 Dysphagia, oropharyngeal phase: Secondary | ICD-10-CM | POA: Diagnosis not present

## 2020-05-26 DIAGNOSIS — M8580 Other specified disorders of bone density and structure, unspecified site: Secondary | ICD-10-CM | POA: Diagnosis present

## 2020-05-26 DIAGNOSIS — Q278 Other specified congenital malformations of peripheral vascular system: Secondary | ICD-10-CM | POA: Diagnosis not present

## 2020-05-26 DIAGNOSIS — Z8249 Family history of ischemic heart disease and other diseases of the circulatory system: Secondary | ICD-10-CM

## 2020-05-26 DIAGNOSIS — R634 Abnormal weight loss: Secondary | ICD-10-CM

## 2020-05-26 DIAGNOSIS — Z85828 Personal history of other malignant neoplasm of skin: Secondary | ICD-10-CM

## 2020-05-26 DIAGNOSIS — R54 Age-related physical debility: Secondary | ICD-10-CM | POA: Diagnosis not present

## 2020-05-26 DIAGNOSIS — I2510 Atherosclerotic heart disease of native coronary artery without angina pectoris: Secondary | ICD-10-CM | POA: Diagnosis not present

## 2020-05-26 DIAGNOSIS — Z87892 Personal history of anaphylaxis: Secondary | ICD-10-CM

## 2020-05-26 DIAGNOSIS — G1220 Motor neuron disease, unspecified: Secondary | ICD-10-CM | POA: Diagnosis present

## 2020-05-26 DIAGNOSIS — I700 Atherosclerosis of aorta: Secondary | ICD-10-CM | POA: Diagnosis present

## 2020-05-26 DIAGNOSIS — E1143 Type 2 diabetes mellitus with diabetic autonomic (poly)neuropathy: Secondary | ICD-10-CM | POA: Diagnosis present

## 2020-05-26 DIAGNOSIS — M5416 Radiculopathy, lumbar region: Secondary | ICD-10-CM | POA: Diagnosis present

## 2020-05-26 DIAGNOSIS — I7122 Aneurysm of the aortic arch, without rupture: Secondary | ICD-10-CM

## 2020-05-26 DIAGNOSIS — Z79899 Other long term (current) drug therapy: Secondary | ICD-10-CM

## 2020-05-26 DIAGNOSIS — R0600 Dyspnea, unspecified: Secondary | ICD-10-CM | POA: Diagnosis not present

## 2020-05-26 DIAGNOSIS — R000 Tachycardia, unspecified: Secondary | ICD-10-CM | POA: Diagnosis not present

## 2020-05-26 DIAGNOSIS — Z818 Family history of other mental and behavioral disorders: Secondary | ICD-10-CM

## 2020-05-26 DIAGNOSIS — I719 Aortic aneurysm of unspecified site, without rupture: Secondary | ICD-10-CM | POA: Diagnosis not present

## 2020-05-26 DIAGNOSIS — Z882 Allergy status to sulfonamides status: Secondary | ICD-10-CM

## 2020-05-26 DIAGNOSIS — R1310 Dysphagia, unspecified: Secondary | ICD-10-CM

## 2020-05-26 DIAGNOSIS — F32A Depression, unspecified: Secondary | ICD-10-CM | POA: Diagnosis present

## 2020-05-26 DIAGNOSIS — G250 Essential tremor: Secondary | ICD-10-CM | POA: Diagnosis present

## 2020-05-26 DIAGNOSIS — Z86008 Personal history of in-situ neoplasm of other site: Secondary | ICD-10-CM

## 2020-05-26 DIAGNOSIS — E785 Hyperlipidemia, unspecified: Secondary | ICD-10-CM | POA: Diagnosis present

## 2020-05-26 DIAGNOSIS — Z85840 Personal history of malignant neoplasm of eye: Secondary | ICD-10-CM

## 2020-05-26 LAB — CBC
HCT: 43.9 % (ref 36.0–46.0)
Hemoglobin: 14.3 g/dL (ref 12.0–15.0)
MCH: 29.8 pg (ref 26.0–34.0)
MCHC: 32.6 g/dL (ref 30.0–36.0)
MCV: 91.5 fL (ref 80.0–100.0)
Platelets: 367 K/uL (ref 150–400)
RBC: 4.8 MIL/uL (ref 3.87–5.11)
RDW: 12.2 % (ref 11.5–15.5)
WBC: 10.1 K/uL (ref 4.0–10.5)
nRBC: 0 % (ref 0.0–0.2)

## 2020-05-26 LAB — BASIC METABOLIC PANEL WITH GFR
Anion gap: 9 (ref 5–15)
BUN: 15 mg/dL (ref 8–23)
CO2: 26 mmol/L (ref 22–32)
Calcium: 9 mg/dL (ref 8.9–10.3)
Chloride: 101 mmol/L (ref 98–111)
Creatinine, Ser: 0.4 mg/dL — ABNORMAL LOW (ref 0.44–1.00)
GFR, Estimated: >60 mL/min (ref >60)
Glucose, Bld: 245 mg/dL — ABNORMAL HIGH (ref 70–99)
Potassium: 4 mmol/L (ref 3.5–5.1)
Sodium: 136 mmol/L (ref 135–145)

## 2020-05-26 LAB — TROPONIN I (HIGH SENSITIVITY): Troponin I (High Sensitivity): 6 ng/L (ref <18)

## 2020-05-26 NOTE — ED Triage Notes (Signed)
Patient states she went to her physician today. Patient states she has an elevated heart rate and weight loss.

## 2020-05-26 NOTE — Progress Notes (Signed)
Careteam: Patient Care Team: Yvonna Alanis, NP as PCP - General (Adult Health Nurse Practitioner) Lorretta Harp, MD as PCP - Cardiology (Cardiology) Yvonna Alanis, NP as Nurse Practitioner (Orthopedic Surgery)  Seen by: Windell Moulding, AGNP-C  PLACE OF SERVICE:  [place] Directive information Does Patient Have a Medical Advance Directive?: No  Allergies  Allergen Reactions  . Avelox [Moxifloxacin Hcl In Nacl] Anaphylaxis, Nausea Only and Other (See Comments)    Throat and tongue swelling  (Critical)  . Altace [Ramipril] Other (See Comments)    Tremors and increased tinnitus  . Hctz [Hydrochlorothiazide] Other (See Comments)    Palpitations and tremors  . Lisinopril Other (See Comments)    Headaches  . Losartan Other (See Comments)    Palpitations and tremors  . Statins Other (See Comments)    Increased blood pressure  . Sulfa Antibiotics Rash    Chief Complaint  Patient presents with  . Medical Management of Chronic Issues    1 Month Follow Up     HPI: Patient is a 79 y.o. female seen today for follow up on medical conditions.   Son present for encounter. Lab and imaging results reviewed with patient.   At the beginning of encounter her heart rate was elevated to 110 at rest. EKG was performed confirming sinus tachycardia. Afebrile. She denies dehydration, blood loss, or signs of infection. In the past she use to take metoprolol, but it was discontinued due to low blood pressure.   Today, she is 84 lbs, 4 lbs less than last month. Continues to have trouble eating. States "it feels like something is in my throat when I swallow." Family has been giving her Boost as a meal supplement. No history of aspiration. In addition, her son states her voice is more raspy. Overall, she has lost over 30 lbs within the past year. Previous provider ordered a chest x-ray in April 2021, unremarkable. Ultrasound of neck and thyroid done 11/29, unremarkable. She has a past  history of prolonged exposure to cigarette smoke.   At this time, I have advised the patient and son to go to the emergency department at [facility] for work up of tachycardia, failure to thrive and trouble swallowing.   Review of Systems:  Review of Systems  Constitutional: Positive for malaise/fatigue and weight loss. Negative for fever.  HENT: Negative for sore throat.        Trouble swallowing  Respiratory: Negative for cough, shortness of breath and wheezing.   Cardiovascular: Negative for chest pain, palpitations and leg swelling.  Gastrointestinal: Positive for constipation. Negative for abdominal pain, blood in stool, diarrhea, heartburn, nausea and vomiting.  Genitourinary: Negative for dysuria and hematuria.  Musculoskeletal: Negative for joint pain and myalgias.  Neurological: Positive for tremors and weakness. Negative for dizziness.  Psychiatric/Behavioral: Negative for depression. The patient is not nervous/anxious.     Past Medical History:  Diagnosis Date  . Aortic atherosclerosis ([place])   . BCC (basal cell carcinoma of skin) 06/24/1993   Left Shoulder  . 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 Cancer ([place])    skin and eye  . Constipation   . Coronary artery disease    NUCLEAR STRESS TEST, 09/30/2009 - EKG negative for ischemia  . Depression   . Diabetes mellitus without complication ([place])    Treated by diet  . Essential tremor    hands, sometimes legs   . Family history of coronary  artery disease    Mother died 33  . GERD (gastroesophageal reflux disease)   . H/O subconjunctival hemorrhage   . Heart attack ([place])   . History of blood transfusion   . History of failure to thrive syndrome   . History of weakness   . Hyperlipidemia   . Hypertension   . 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 Lumbar back pain with radiculopathy affecting lower extremity   . Myocardial infarction ([place]) 2006  . Neck pain   . 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 Osteopenia   . Presence of pessary   . Retroperitoneal hematoma    S/P  . Schatzki's ring   . Shingles   . 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 Trapezius muscle spasm    right side  . Uterovaginal prolapse   . Weight loss    Past Surgical History:  Procedure Laterality Date  . CATARACT EXTRACTION, BILATERAL    . COLONOSCOPY    . CORONARY ANGIOPLASTY WITH STENT PLACEMENT  11/05/2004   RCA DES  . DILATION AND CURETTAGE OF UTERUS    . LID LESION EXCISION Left 02/14/2018   Procedure: LEFT LID LESION EXCISION;  Surgeon: Clista Bernhardt, MD;  Location: [place];  Service: Ophthalmology;  Laterality: Left;  . RECONSTRUCTION OF EYELID Left 02/14/2018   Procedure: TOTAL RECONSTRUCTION OF EYELID;  Surgeon: Clista Bernhardt, MD;  Location: [place];  Service: Ophthalmology;  Laterality: Left;  . SKIN FULL THICKNESS GRAFT Left 02/14/2018   Procedure: SKIN GRAFT FULL THICKNESS LEFT EYE;  Surgeon: Clista Bernhardt, MD;  Location: [place];  Service: Ophthalmology;  Laterality: Left;  . TONSILECTOMY/ADENOIDECTOMY WITH MYRINGOTOMY    . TUBAL LIGATION     Social History:   reports that she has never smoked. She has never used smokeless tobacco. She reports current alcohol use. She reports that she does not use drugs.  Family History  Problem Relation Age of Onset  . Heart attack Mother 15  . Cancer Father        Colon  . Heart attack Maternal Grandmother 61  . High blood pressure Son   . High Cholesterol Son   . Depression Son   . Depression Daughter   . Depression Daughter   . Depression Son     Medications: Patient's Medications  New Prescriptions   No medications on file  Previous Medications   ACETAMINOPHEN (TYLENOL) 500 MG TABLET    Take 1,000 mg by mouth as needed.   AMLODIPINE (NORVASC) 2.5 MG TABLET    Take 1 tablet (2.5 mg  total) by mouth daily.   ASPIRIN BUF,CACARB-MGCARB-MGO, 81 MG TABS    Take 81 mg by mouth daily.    COENZYME Q10 100 MG CAPSULE    Take 100 mg by mouth daily.    DIFLUPREDNATE 0.05 % EMUL    Place 1 drop into the right eye daily.    DULOXETINE (CYMBALTA) 30 MG CAPSULE    Take 30 mg by mouth 2 (two) times daily.    ERTUGLIFLOZIN L-PYROGLUTAMICAC (STEGLATRO) 5 MG TABS TABLET    Take 5 mg by mouth daily.   FLUTICASONE (FLONASE) 50 MCG/ACT NASAL SPRAY    Place 2 sprays into both nostrils daily as needed for allergies or rhinitis.   LORATADINE (CLARITIN) 10 MG TABLET  Take 10 mg by mouth every other day.   METFORMIN (GLUCOPHAGE-XR) 500 MG 24 HR TABLET    Take 1,000 mg by mouth 2 (two) times daily.    NITROGLYCERIN (NITROSTAT) 0.4 MG SL TABLET    Place 1 tablet (0.4 mg total) under the tongue every 5 (five) minutes as needed for chest pain.   PRAVASTATIN (PRAVACHOL) 20 MG TABLET    Take 1 tablet (20 mg total) by mouth every evening.   PROPYLENE GLYCOL 0.6 % SOLN    Place 1 drop into both eyes as needed.   Modified Medications   No medications on file  Discontinued Medications   No medications on file    Physical Exam:  Vitals:   05/26/20 1450  BP: 130/76  Pulse: (!) 110  Resp: 20  Temp: (!) 96.8 F (36 C)  TempSrc: Temporal  SpO2: 97%  Weight: 84 lb (38.1 kg)  Height: 5\' 2"  (1.575 m)   Body mass index is 15.36 kg/m. Wt Readings from Last 3 Encounters:  05/26/20 84 lb (38.1 kg)  04/21/20 87 lb 9.6 oz (39.7 kg)  06/27/19 117 lb 11.2 oz (53.4 kg)    Physical Exam Vitals reviewed.  Constitutional:      General: She is not in acute distress.    Appearance: She is ill-appearing.     Comments: Cachexia, frail, voice hoarse  HENT:     Head: Normocephalic.     Nose: Nose normal.     Mouth/Throat:     Mouth: Mucous membranes are dry.     Pharynx: Oropharynx is clear. Uvula midline. No posterior oropharyngeal erythema.     Tonsils: No tonsillar exudate.  Cardiovascular:      Rate and Rhythm: Regular rhythm. Tachycardia present.     Pulses: Normal pulses.     Heart sounds: Normal heart sounds. No murmur heard.   Pulmonary:     Effort: Pulmonary effort is normal. No respiratory distress.     Breath sounds: Normal breath sounds. No wheezing.  Abdominal:     General: Abdomen is flat. There is no distension.     Palpations: Abdomen is soft.     Tenderness: There is no abdominal tenderness.     Comments: Hyperactive x4  Musculoskeletal:     Cervical back: Normal range of motion. No tenderness.  Lymphadenopathy:     Cervical: No cervical adenopathy.  Skin:    General: Skin is warm and dry.     Capillary Refill: Capillary refill takes less than 2 seconds.  Neurological:     General: No focal deficit present.     Mental Status: She is alert and oriented to person, place, and time. Mental status is at baseline.     Motor: Weakness present.     Gait: Gait abnormal.     Comments: wheelchair  Psychiatric:        Mood and Affect: Mood normal.        Behavior: Behavior normal.        Thought Content: Thought content normal.        Judgment: Judgment normal.     Labs reviewed: Basic Metabolic Panel: Recent Labs    10/07/19 0000 04/21/20 1438  NA 134* 137  K 4.2 4.0  CL 99 98  CO2 26* 28  GLUCOSE  --  161*  BUN 16 13  CREATININE 0.7 0.42*  CALCIUM 8.8 9.8  TSH 1.38 2.01   Liver Function Tests: Recent Labs    10/07/19 0000 04/21/20 1438  04/29/20 0000  AST 13 14  14   --   ALT 21 13  13   --   ALKPHOS 80  --   --   BILITOT  --  0.4  0.4  --   PROT  --  6.8  6.8  --   ALBUMIN 4.1  --  3.7   No results for input(s): LIPASE, AMYLASE in the last 8760 hours. No results for input(s): AMMONIA in the last 8760 hours. CBC: Recent Labs    10/07/19 0000 04/21/20 1438  WBC 11.7 11.6*  NEUTROABS  --  8,514*  HGB 14.5 15.3  HCT 47* 45.9*  MCV  --  89.0  PLT 334 410*   Lipid Panel: Recent Labs    10/07/19 0000  CHOL 222*  HDL 52   LDLCALC 93  TRIG 383*   TSH: Recent Labs    10/07/19 0000 04/21/20 1438  TSH 1.38 2.01   A1C: Lab Results  Component Value Date   HGBA1C 7.7 (H) 04/21/2020     Assessment/Plan 1. Tachycardia - EKG- today - EKG sinus tachycardia with rate of 116 - suspect due to dehydration or severe malnutrition - history of using metoprolol in past - advised to go to [facility] for further evaluation after this visit  2. 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 Weight loss, unintentional - ongoing, with additional weight loss of 4 lbs from last month - concerned about malignancy as underlying factor  4. Hoarseness of voice - her voice appears more hoarse from 1 month ago - non-smoker but had prolonged exposure to second hand smoke - concerned about malignancy as underlying factor - recommend ST evaluation - recommend referral to ENT   Next appt: N/A Shawnmichael Parenteau [facility]night, [facility] [phone]

## 2020-05-27 ENCOUNTER — Emergency Department (HOSPITAL_COMMUNITY): Payer: Medicare Other

## 2020-05-27 ENCOUNTER — Observation Stay (HOSPITAL_COMMUNITY): Payer: Medicare Other

## 2020-05-27 ENCOUNTER — Encounter (HOSPITAL_COMMUNITY): Payer: Self-pay | Admitting: Internal Medicine

## 2020-05-27 DIAGNOSIS — R627 Adult failure to thrive: Secondary | ICD-10-CM

## 2020-05-27 DIAGNOSIS — I712 Thoracic aortic aneurysm, without rupture: Secondary | ICD-10-CM | POA: Diagnosis not present

## 2020-05-27 DIAGNOSIS — G1220 Motor neuron disease, unspecified: Secondary | ICD-10-CM | POA: Diagnosis not present

## 2020-05-27 DIAGNOSIS — R634 Abnormal weight loss: Secondary | ICD-10-CM

## 2020-05-27 DIAGNOSIS — I2510 Atherosclerotic heart disease of native coronary artery without angina pectoris: Secondary | ICD-10-CM

## 2020-05-27 DIAGNOSIS — R0600 Dyspnea, unspecified: Secondary | ICD-10-CM | POA: Diagnosis not present

## 2020-05-27 DIAGNOSIS — Q278 Other specified congenital malformations of peripheral vascular system: Secondary | ICD-10-CM | POA: Diagnosis not present

## 2020-05-27 DIAGNOSIS — I10 Essential (primary) hypertension: Secondary | ICD-10-CM

## 2020-05-27 DIAGNOSIS — R1310 Dysphagia, unspecified: Secondary | ICD-10-CM

## 2020-05-27 DIAGNOSIS — R490 Dysphonia: Secondary | ICD-10-CM | POA: Diagnosis not present

## 2020-05-27 LAB — GLUCOSE, CAPILLARY
Glucose-Capillary: 125 mg/dL — ABNORMAL HIGH (ref 70–99)
Glucose-Capillary: 165 mg/dL — ABNORMAL HIGH (ref 70–99)
Glucose-Capillary: 174 mg/dL — ABNORMAL HIGH (ref 70–99)

## 2020-05-27 LAB — RESP PANEL BY RT-PCR (FLU A&B, COVID) ARPGX2
Influenza A by PCR: NEGATIVE
Influenza B by PCR: NEGATIVE
SARS Coronavirus 2 by RT PCR: NEGATIVE

## 2020-05-27 LAB — HEPATIC FUNCTION PANEL
ALT: 15 U/L (ref 0–44)
AST: 14 U/L — ABNORMAL LOW (ref 15–41)
Albumin: 4.2 g/dL (ref 3.5–5.0)
Alkaline Phosphatase: 72 U/L (ref 38–126)
Bilirubin, Direct: 0.1 mg/dL (ref 0.0–0.2)
Indirect Bilirubin: 0.5 mg/dL (ref 0.3–0.9)
Total Bilirubin: 0.6 mg/dL (ref 0.3–1.2)
Total Protein: 6.7 g/dL (ref 6.5–8.1)

## 2020-05-27 LAB — TSH: TSH: 3.06 u[IU]/mL (ref 0.350–4.500)

## 2020-05-27 LAB — CBG MONITORING, ED
Glucose-Capillary: 132 mg/dL — ABNORMAL HIGH (ref 70–99)
Glucose-Capillary: 118 mg/dL — ABNORMAL HIGH (ref 70–99)

## 2020-05-27 LAB — TROPONIN I (HIGH SENSITIVITY): Troponin I (High Sensitivity): 3 ng/L (ref <18)

## 2020-05-27 MED ORDER — ACETAMINOPHEN 650 MG RE SUPP: 650.0000 mg | Freq: Four times a day (QID) | RECTAL | Status: DC | PRN

## 2020-05-27 MED ORDER — LABETALOL HCL 5 MG/ML IV SOLN
5.0000 mg | INTRAVENOUS | Status: DC | PRN
  Filled 2020-05-27: qty 4

## 2020-05-27 MED ORDER — SODIUM CHLORIDE 0.9 % IV SOLN: INTRAVENOUS | Status: AC

## 2020-05-27 MED ORDER — ACETAMINOPHEN 325 MG PO TABS: 650.0000 mg | ORAL_TABLET | Freq: Four times a day (QID) | ORAL | Status: DC | PRN

## 2020-05-27 MED ORDER — INSULIN ASPART 100 UNIT/ML ~~LOC~~ SOLN
0.0000 [IU] | SUBCUTANEOUS | Status: DC
  Administered 2020-05-27: 21:00:00 2 [IU] via SUBCUTANEOUS
  Administered 2020-05-28: 17:00:00 5 [IU] via SUBCUTANEOUS
  Administered 2020-05-28: 12:00:00 2 [IU] via SUBCUTANEOUS
  Filled 2020-05-27: qty 0.09

## 2020-05-27 NOTE — Progress Notes (Signed)
Modified Barium Swallow Progress Note  Patient Details  Name: [name] Wright MRN: [id number] Date of Birth: 1941/02/25  Today's Date: 05/27/2020  Modified Barium Swallow completed.  Full report located under Chart Review in the Imaging Section.  Brief recommendations include the following:  Clinical Impression  Mild oropharyngeal dysphagia with sensorimotor deficits. . Decreased oropharyngeal motility results in mild pharyngeal residuals and laryngeal penetration/trace aspiration of liquids. Cued cough was weak and not effective to clear aspirates.  Dry swallows helpful to decrease pharyngeal retention. Postures of head turn right/left did not aid pharyngeal clearance and chin tuck not tested due to pt's discomfort with position.  Palatal deviation to right upon phonation noted thus concern for pharyngeal weakness on left.  Pt only tested in sagittal view due to physical limitations.     Pt takes very small bites/sips to compensate and this has certainly contributed to her weight loss.  She declined to swallow a barium tablet when given with options of taking with puree or thin. Question source of dysphagia - ? Neuro, ? Indication for imaging to help elucidate source.    Recommend Dys3/thin diet with precautions.    Consider medications crushed if large and give with applesauce.  (start and follow with liquids)  Advise pt drink water with meals due to her trace aspiration.    Multiple swallows per bite/sip and follow solid with liquid.    Maximize liquid nutrition - pt likes chocolate Boost.    Stop intake if pt coughing with intake.   Swallow Evaluation Recommendations           Liquid Administration via: Cup   Medication Administration: Crushed with puree       Compensations: Slow rate;Small sips/bites;Follow solids with liquid   Postural Changes: Remain semi-upright after after feeds/meals (Comment);Seated upright at 90 degrees   Oral Care Recommendations: Oral care BID       Kathleen Lime, MS [facility] SLP Acute Rehab Services Office [phone] Pager [phone]   Macario Golds 05/27/2020,2:31 PM

## 2020-05-27 NOTE — Progress Notes (Addendum)
Preliminary report as follows.  Mild oropharyngeal dysphagia with sensorimotor deficits. . Decreased oropharyngeal motility results in mild pharyngeal residuals and laryngeal penetration/trace aspiration of liquids. Cued cough was weak and not effective to clear aspirates.  Dry swallows helpful to decrease pharyngeal retention. Postures of head turn right/left did not aid pharyngeal clearance and chin tuck not tested due to pt's discomfort with position.  Palatal deviation to right upon phonation noted thus concern for pharyngeal weakness on left.  Pt only tested in sagittal view due to physical limitations Pt takes very small bites/sips to compensate and this has certainly contributed to her weight loss.  She declined to swallow a barium tablet when given with options of taking with puree or thin. Question source of dysphagia - ? Neuro, ? Indication for imaging to help elucidate source.    Recommend Dys3/thin diet with precautions.   Consider medications crushed if large and give with applesauce.  (start and follow with liquids) Advise pt drink water with meals due to her trace aspiration.   Multiple swallows per bite/sip and follow solid with liquid.   Maximize liquid nutrition - pt likes chocolate Boost.   Stop intake if pt coughing with intake.   Will follow up for dysphagia management.  Pt and son educated to findings/recommendations.    Kathleen Lime, MS [facility] SLP Acute Rehab Services Office [phone] Pager [phone]

## 2020-05-27 NOTE — H&P (Signed)
History and Physical    [name] Wright BWG:[id number] DOB: 1940/09/17 DOA: 05/26/2020  PCP: Yvonna Alanis, NP   Patient coming from: Home.  Chief Complaint: Difficulty swallowing.  Hoarseness.  HPI: [name] Wright is a 79 y.o. female with history of diabetes mellitus type 2, hypertension, CAD status post stenting has been experiencing difficulty swallowing with some hoarseness with weight loss for almost last 8 to 9 months which is progressive got worse.  Patient has difficulty swallowing solids more than liquids.  Also developed some hoarseness of the voice.  Patient primary care patient had an ultrasound of the thyroid last month which was unremarkable.  Had followed up with her primary care physician today and was found to be tachycardic and was referred to the ER.  Patient states she is able to swallow but has to really put a lot of effort for the solids.  In the last few days patient has lost 4 pounds.  Denies any fever chills chest pain vomiting or diarrhea.  Denies any weakness of the extremities or ptosis.  ED Course: In the ER patient was mildly tachycardic not hypoxic ER physician did a CT chest and neck without contrast which were largely unremarkable except for some pulmonary nodules.  Labs are unremarkable except for hyperglycemia Covid test is pending.  Bedside swallow was done which patient failed.  Given the weight loss dysphagia and failed swallow will admit for further observation and possible GI work-up.  Review of Systems: As per HPI, rest all negative.   Past Medical History:  Diagnosis Date  . Aortic atherosclerosis ([place])   . BCC (basal cell carcinoma of skin) 06/24/1993   Left Shoulder  . 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 Cancer ([place])    skin and eye  . Constipation   . Coronary artery disease    NUCLEAR STRESS TEST, 09/30/2009 - EKG negative for ischemia  . Depression   . Diabetes mellitus without complication ([place])    Treated by diet  .  Essential tremor    hands, sometimes legs   . Family history of coronary artery disease    Mother died 17  . GERD (gastroesophageal reflux disease)   . H/O subconjunctival hemorrhage   . Heart attack ([place])   . History of blood transfusion   . History of failure to thrive syndrome   . History of weakness   . Hyperlipidemia   . Hypertension   . 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 Lumbar back pain with radiculopathy affecting lower extremity   . Myocardial infarction ([place]) 2006  . Neck pain   . 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 Osteopenia   . Presence of pessary   . Retroperitoneal hematoma    S/P  . Schatzki's ring   . Shingles   . 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 Trapezius muscle spasm    right side  . Uterovaginal prolapse   . Weight loss     Past Surgical History:  Procedure Laterality Date  . CATARACT EXTRACTION, BILATERAL    . COLONOSCOPY    . CORONARY ANGIOPLASTY WITH STENT PLACEMENT  11/05/2004   RCA DES  . DILATION AND CURETTAGE OF UTERUS    . LID LESION EXCISION Left 02/14/2018   Procedure: LEFT LID LESION EXCISION;  Surgeon: Kristeen Miss,  Peyton Najjar, MD;  Location: [place];  Service: Ophthalmology;  Laterality: Left;  . RECONSTRUCTION OF EYELID Left 02/14/2018   Procedure: TOTAL RECONSTRUCTION OF EYELID;  Surgeon: Clista Bernhardt, MD;  Location: [place];  Service: Ophthalmology;  Laterality: Left;  . SKIN FULL THICKNESS GRAFT Left 02/14/2018   Procedure: SKIN GRAFT FULL THICKNESS LEFT EYE;  Surgeon: Clista Bernhardt, MD;  Location: [place];  Service: Ophthalmology;  Laterality: Left;  . TONSILECTOMY/ADENOIDECTOMY WITH MYRINGOTOMY    . TUBAL LIGATION       reports that she has never smoked. She has never used smokeless tobacco. She reports previous alcohol use. She reports that she does not use drugs.  Allergies   Allergen Reactions  . Avelox [Moxifloxacin Hcl In Nacl] Anaphylaxis, Nausea Only and Other (See Comments)    Throat and tongue swelling  (Critical)  . Altace [Ramipril] Other (See Comments)    Tremors and increased tinnitus  . Hctz [Hydrochlorothiazide] Other (See Comments)    Palpitations and tremors  . Lisinopril Other (See Comments)    Headaches  . Losartan Other (See Comments)    Palpitations and tremors  . Statins Other (See Comments)    Increased blood pressure  . Sulfa Antibiotics Rash    Family History  Problem Relation Age of Onset  . Heart attack Mother 22  . Cancer Father        Colon  . Heart attack Maternal Grandmother 61  . High blood pressure Son   . High Cholesterol Son   . Depression Son   . Depression Daughter   . Depression Daughter   . Depression Son     Prior to Admission medications   Medication Sig Start Date End Date Taking? Authorizing Provider  acetaminophen (TYLENOL) 500 MG tablet Take 1,000 mg by mouth as needed for mild pain.   Yes [provider]  amLODipine (NORVASC) 2.5 MG tablet Take 1 tablet (2.5 mg total) by mouth daily. 07/12/19  Yes Patwardhan, Manish J, MD  Aspirin Buf,CaCarb-MgCarb-MgO, 81 MG TABS Take 81 mg by mouth daily.    Yes [provider]  Coenzyme Q10 100 MG capsule Take 100 mg by mouth daily.    Yes [provider]  Difluprednate 0.05 % EMUL Place 1 drop into the right eye daily.    Yes [provider]  DULoxetine (CYMBALTA) 30 MG capsule Take 30 mg by mouth 2 (two) times daily.    Yes [provider]  ertugliflozin L-PyroglutamicAc (STEGLATRO) 5 MG TABS tablet Take 5 mg by mouth daily.   Yes [provider]  fluticasone (FLONASE) 50 MCG/ACT nasal spray Place 2 sprays into both nostrils daily as needed for allergies or rhinitis.   Yes [provider]  loratadine (CLARITIN) 10 MG tablet Take 10 mg by mouth every other day.   Yes [provider]  metFORMIN  (GLUCOPHAGE-XR) 500 MG 24 hr tablet Take 1,000 mg by mouth 2 (two) times daily.    Yes [provider]  nitroGLYCERIN (NITROSTAT) 0.4 MG SL tablet Place 1 tablet (0.4 mg total) under the tongue every 5 (five) minutes as needed for chest pain. 11/18/19  Yes Patwardhan, Manish J, MD  polyethylene glycol (MIRALAX / GLYCOLAX) 17 g packet Take 17 g by mouth every other day.   Yes [provider]  pravastatin (PRAVACHOL) 20 MG tablet Take 1 tablet (20 mg total) by mouth every evening. 05/06/19  Yes Patwardhan, [name], MD  Propylene Glycol 0.6 % SOLN Place  1 drop into both eyes as needed (dry eyes).   Yes [provider]    Physical Exam: Constitutional: Moderately built and nourished. Vitals:   05/27/20 0030 05/27/20 0100 05/27/20 0130 05/27/20 0200  BP: 134/88 (!) 148/83 134/78 129/78  Pulse: 99 (!) 104 94 94  Resp: (!) 24 (!) 33 (!) 22 (!) 22  Temp:      TempSrc:      SpO2: 97% 96% 97% 97%  Weight:      Height:       Eyes: Anicteric no pallor. ENMT: No discharge from the ears eyes nose or mouth. Neck: No mass felt.  No neck rigidity. Respiratory: No rhonchi or crepitations. Cardiovascular: S1-S2 heard. Abdomen: Soft nontender bowel sounds present. Musculoskeletal: No edema. Skin: No rash. Neurologic: Alert awake oriented to time place and person.  Moves all extremities. Psychiatric: Appears normal.  Normal affect.   Labs on Admission: I have personally reviewed following labs and imaging studies  CBC: Recent Labs  Lab 05/26/20 1752  WBC 10.1  HGB 14.3  HCT 43.9  MCV 91.5  PLT 449   Basic Metabolic Panel: Recent Labs  Lab 05/26/20 1752  NA 136  K 4.0  CL 101  CO2 26  GLUCOSE 245*  BUN 15  CREATININE 0.40*  CALCIUM 9.0   GFR: Estimated Creatinine Clearance: 33.7 mL/min (A) (by C-G formula based on SCr of 0.4 mg/dL (L)). Liver Function Tests: No results for input(s): AST, ALT, ALKPHOS, BILITOT, PROT, ALBUMIN in the last 168 hours. No  results for input(s): LIPASE, AMYLASE in the last 168 hours. No results for input(s): AMMONIA in the last 168 hours. Coagulation Profile: No results for input(s): INR, PROTIME in the last 168 hours. Cardiac Enzymes: No results for input(s): CKTOTAL, CKMB, CKMBINDEX, TROPONINI in the last 168 hours. BNP (last 3 results) No results for input(s): PROBNP in the last 8760 hours. HbA1C: No results for input(s): HGBA1C in the last 72 hours. CBG: No results for input(s): GLUCAP in the last 168 hours. Lipid Profile: No results for input(s): CHOL, HDL, LDLCALC, TRIG, CHOLHDL, LDLDIRECT in the last 72 hours. Thyroid Function Tests: Recent Labs    05/27/20 0040  TSH 3.060   Anemia Panel: No results for input(s): VITAMINB12, FOLATE, FERRITIN, TIBC, IRON, RETICCTPCT in the last 72 hours. Urine analysis:    Component Value Date/Time   COLORURINE STRAW (A) 02/19/2017 1455   APPEARANCEUR HAZY (A) 02/19/2017 1455   LABSPEC 1.002 (L) 02/19/2017 1455   PHURINE 8.0 02/19/2017 1455   GLUCOSEU 50 (A) 02/19/2017 1455   HGBUR NEGATIVE 02/19/2017 1455   BILIRUBINUR NEGATIVE 02/19/2017 1455   KETONESUR NEGATIVE 02/19/2017 1455   PROTEINUR NEGATIVE 02/19/2017 1455   UROBILINOGEN 0.2 07/28/2013 1153   NITRITE NEGATIVE 02/19/2017 1455   LEUKOCYTESUR LARGE (A) 02/19/2017 1455   Sepsis Labs: @LABRCNTIP (procalcitonin:4,lacticidven:4) )No results found for this or any previous visit (from the past 240 hour(s)).   Radiological Exams on Admission: DG Chest 2 View  Result Date: 05/26/2020 CLINICAL DATA:  Tachycardia. EXAM: CHEST - 2 VIEW COMPARISON:  Chest x-ray dated February 19, 2017. FINDINGS: The heart size and mediastinal contours are within normal limits. Both lungs are clear. The visualized skeletal structures are unremarkable. IMPRESSION: No active cardiopulmonary disease. Electronically Signed   By: Titus Dubin M.D.   On: 05/26/2020 19:17   CT Soft Tissue Neck Wo Contrast  Result Date:  05/27/2020 CLINICAL DATA:  Hoarseness. EXAM: CT NECK WITHOUT CONTRAST TECHNIQUE: Multidetector CT imaging of the  neck was performed following the standard protocol without intravenous contrast. COMPARISON:  None. FINDINGS: Pharynx and larynx: Mildly motion degraded study, particularly at the supraglottic level. Contour abnormality of the posterior right hypopharynx is suspected to be a retropharyngeal internal carotid artery, but difficult to be sure without IV contrast. No other pharyngeal or laryngeal abnormality. Salivary glands: No inflammation, mass, or stone. Thyroid: Normal. Lymph nodes: None enlarged or abnormal density. Vascular: Aberrant right subclavian artery. Limited intracranial: Negative. Visualized orbits: Negative. Mastoids and visualized paranasal sinuses: Clear. Skeleton: No acute or aggressive process. Upper chest: Negative. Other: None. IMPRESSION: 1. Mildly motion degraded study, particularly at the level of the supraglottic larynx. 2. Contour abnormality of the posterior right hypopharynx is suspected to be a retropharyngeal internal carotid artery, but difficult to be sure without IV contrast. Otherwise normal appearance of the pharynx and larynx. 3. Aberrant right subclavian artery. Electronically Signed   By: Ulyses Jarred M.D.   On: 05/27/2020 01:28   CT Chest Wo Contrast  Result Date: 05/27/2020 CLINICAL DATA:  Unintentional weight loss, intermittent dyspnea EXAM: CT CHEST WITHOUT CONTRAST TECHNIQUE: Multidetector CT imaging of the chest was performed following the standard protocol without IV contrast. COMPARISON:  None. FINDINGS: Cardiovascular: Extensive multi-vessel coronary artery calcification. Global cardiac size within normal limits. No pericardial effusion. Central pulmonary arteries are of normal caliber. Variant arch anatomy is identified with aberrant origin of the right subclavian artery and common origin of the common carotid arteries. Mild dilation of the aortic arch  is identified measuring 3.1 cm in greatest dimension beyond the takeoff of the left subclavian artery. The ascending aorta and more distal descending aorta are of normal caliber. Moderate atherosclerotic calcification within the thoracic aorta. Mediastinum/Nodes: No enlarged mediastinal or axillary lymph nodes. Thyroid gland, trachea, and esophagus demonstrate no significant findings. Lungs/Pleura: 6 mm ground-glass pulmonary nodule, right upper lobe, image # 49. 4 mm noncalcified pulmonary nodule, right lower lobe, image # 70. No other focal pulmonary nodules or infiltrates. No pneumothorax or pleural effusion. Central airways are widely patent. Upper Abdomen: Tiny cyst within the visualized left hepatic lobe. No acute abnormality. Musculoskeletal: Osseous structures appear osteopenic. No acute bone abnormality. No focal lytic or blastic bone lesions. IMPRESSION: No definite radiographic explanation for the patient's reported weight loss. Multiple pulmonary nodules. Initial follow-up with CT at 6-12 months is recommended to confirm persistence. If persistent, repeat CT is recommended every 2 years until 5 years of stability has been established. This recommendation follows the consensus statement: Guidelines for Management of Incidental Pulmonary Nodules Detected on CT Images: From the Fleischner Society 2017; Radiology 2017; 284:228-243. 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 Recommend annual imaging followup by CTA or MRA. This recommendation follows 2010 ACCF/AHA/AATS/ACR/ASA/SCA/SCAI/SIR/STS/SVM Guidelines for the Diagnosis and Management of Patients with Thoracic Aortic Disease. Circulation.2010; 121: M226-J335. Aortic aneurysm NOS (ICD10-I71.9) Aortic Atherosclerosis (ICD10-I70.0). Electronically Signed   By: Fidela Salisbury MD   On: 05/27/2020 01:32    EKG: Independently reviewed.  Normal sinus rhythm.  Assessment/Plan Principal Problem:   Dysphagia Active  Problems:   CAD- RCA DES 2006   Essential hypertension   Weight loss, unintentional    1. Dysphagia with hoarseness and weight loss which has been progressively worsening will admit for observation get swallow evaluation and consult gastroenterology.  We will keep patient n.p.o. for now. 2. History of hypertension we will keep patient on as needed IV labetalol until patient can swallow. 3. Diabetes mellitus type  2 we will keep patient on sliding scale coverage. 4. History of CAD status post stenting denies any chest pain. 5. Pulmonary nodule seen in the CAT scan of the chest will need follow-up.  Covid test is pending.   DVT prophylaxis: SCDs.  Anticipation of possible procedure avoiding anticoagulation. Code Status: Full code. Family Communication: Discussed with patient's son at the bedside. Disposition Plan: Home. Consults called: We will consult gastroenterology. Admission status: Observation.   Rise Patience MD [facility] Pager [phone].  If 7PM-7AM, please contact night-coverage [email or web address] Password [email or web address]  05/27/2020, 3:11 AM

## 2020-05-27 NOTE — ED Notes (Signed)
Patient here with c/o intermittent sob, increased heart rate and weight loss.

## 2020-05-27 NOTE — ED Notes (Signed)
Patient ambulated to bedside commode with this RN. Fall risk socks and fall risk arm band in place. Patient resting comfortably at this time with no complaints.

## 2020-05-27 NOTE — Evaluation (Signed)
Clinical/Bedside Swallow Evaluation Patient Details  Name: [name] Wright MRN: [id number] Date of Birth: February 04, 1941  Today's Date: 05/27/2020 Time: SLP Start Time (ACUTE ONLY): 0820 SLP Stop Time (ACUTE ONLY): 0905 SLP Time Calculation (min) (ACUTE ONLY): 45 min  Past Medical History:  Past Medical History:  Diagnosis Date  . Aortic atherosclerosis ([place])   . BCC (basal cell carcinoma of skin) 06/24/1993   Left Shoulder  . 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 Cancer ([place])    skin and eye  . Constipation   . Coronary artery disease    NUCLEAR STRESS TEST, 09/30/2009 - EKG negative for ischemia  . Depression   . Diabetes mellitus without complication ([place])    Treated by diet  . Essential tremor    hands, sometimes legs   . Family history of coronary artery disease    Mother died 75  . GERD (gastroesophageal reflux disease)   . H/O subconjunctival hemorrhage   . Heart attack ([place])   . History of blood transfusion   . History of failure to thrive syndrome   . History of weakness   . Hyperlipidemia   . Hypertension   . 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 Lumbar back pain with radiculopathy affecting lower extremity   . Myocardial infarction ([place]) 2006  . Neck pain   . 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 Osteopenia   . Presence of pessary   . Retroperitoneal hematoma    S/P  . Schatzki's ring   . Shingles   . 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 Trapezius muscle spasm    right side  . Uterovaginal prolapse   . Weight loss    Past Surgical History:  Past Surgical History:  Procedure Laterality Date  . CATARACT EXTRACTION, BILATERAL    . COLONOSCOPY    . CORONARY ANGIOPLASTY WITH STENT PLACEMENT  11/05/2004   RCA DES  . DILATION AND CURETTAGE OF UTERUS    . LID LESION EXCISION Left 02/14/2018    Procedure: LEFT LID LESION EXCISION;  Surgeon: Clista Bernhardt, MD;  Location: [place];  Service: Ophthalmology;  Laterality: Left;  . RECONSTRUCTION OF EYELID Left 02/14/2018   Procedure: TOTAL RECONSTRUCTION OF EYELID;  Surgeon: Clista Bernhardt, MD;  Location: [place];  Service: Ophthalmology;  Laterality: Left;  . SKIN FULL THICKNESS GRAFT Left 02/14/2018   Procedure: SKIN GRAFT FULL THICKNESS LEFT EYE;  Surgeon: Clista Bernhardt, MD;  Location: [place];  Service: Ophthalmology;  Laterality: Left;  . TONSILECTOMY/ADENOIDECTOMY WITH MYRINGOTOMY    . TUBAL LIGATION     HPI:  Pt is an 79 yo female with ED visit due to progressive dysphagia, weakness and significant weight loss over the last year.  Pt has hoarseness and dysarthria.  She also admits to progressive weakness overall.  PMH also + for essential tremor, MI, Schaztki's rin, Shingles, GERD, CAD s/p stenting.   CXR negative for acute findings.  Pt failed RN [place] and thus SLP was ordered.  Pt hypernasal and dysarthric and reports that her speech has been difficult to understand over the last few months thus she does not speak on the phone.  She denies requiring heimlich manuever.  Pt admits to gustatory changes and progressive dysphagia to food more than liquids.  She also states she must  be careful taking pills - pointing to near vallecular region and pyriform to indicate area of sensation of residuals.   Assessment / Plan / Recommendation Clinical Impression  Patients presents with dysphagia symptoms consistent with potential progressive neuro condition c/b lingual fasciculations, decreased lingual coordination with concern for decreased pharyngeal and laryngeal motility negatively impacting nutrition and airway protection.  During po trials, multiple swallows across all consistencies noted with cough (nonproductive) noted after first liquid bolus.  MBS indicated to assess oropharyngeal phase of swallowing and help determine least restrictive  diet.    Pt reports she has lost 30 pounds in the last year and current weight is 84 pounds, she attributes to gustatory changes and dysphagia.   She takes very small bites/sips to compensate saying "I've learned" and drinks liquids after solids to clear.    Recommend pt be allowed thin liquids pending MBS.  MD, RN, pt and pt's son in agreement for MBS.   SLP Visit Diagnosis: Dysphagia, oropharyngeal phase (R13.12);Dysphagia, unspecified (R13.10)    Aspiration Risk  Mild aspiration risk    Diet Recommendation Thin liquid   Liquid Administration via: Cup Supervision: Patient able to self feed Compensations: Slow rate;Small sips/bites;Follow solids with liquid Postural Changes: Seated upright at 90 degrees;Remain upright for at least 30 minutes after po intake    Other  Recommendations Recommended Consults: Other (Comment) (consider neuro consult) Oral Care Recommendations: Oral care BID   Follow up Recommendations Other (comment) (tbd)      Frequency and Duration   n'a         Prognosis    n/a    Swallow Study   General Date of Onset: 05/27/20 HPI: Pt is an 79 yo female with ED visit due to progressive dysphagia, weakness and significant weight loss over the last year.  Pt has hoarseness and dysarthria.  She also admits to progressive weakness overall.  PMH also + for essential tremor, MI, Schaztki's rin, Shingles, GERD, CAD s/p stenting.   CXR negative for acute findings.  Pt failed RN [place] and thus SLP was ordered.  Pt hypernasal and dysarthric and reports that her speech has been difficult to understand over the last few months thus she does not speak on the phone.  She denies requiring heimlich manuever.  Pt admits to gustatory changes and progressive dysphagia to food more than liquids.  She also states she must be careful taking pills - pointing to near vallecular region and pyriform to indicate area of sensation of residuals. Type of Study: Bedside Swallow  Evaluation Previous Swallow Assessment: none Diet Prior to this Study: NPO Temperature Spikes Noted: No Respiratory Status: Room air History of Recent Intubation: No Behavior/Cognition: Alert;Cooperative;Pleasant mood Oral Cavity Assessment: Other (comment) (tori present on hard palate) Oral Cavity - Dentition: Adequate natural dentition Vision: Functional for self-feeding Self-Feeding Abilities: Able to feed self Patient Positioning: Upright in bed Baseline Vocal Quality: Hoarse;Suspected CN X (Vagus) involvement Volitional Cough: Weak Volitional Swallow: Able to elicit    Oral/Motor/Sensory Function Overall Oral Motor/Sensory Function: Generalized oral weakness Facial ROM: Within Functional Limits Facial Symmetry: Within Functional Limits Facial Strength: Within Functional Limits Lingual ROM: Other (Comment) (decreased coordination) Lingual Symmetry: Within Functional Limits Lingual Strength: Suspected CN XII (hypoglossal) dysfunction (appearance of fasiculations) Velum: Suspected CN X (Vagus) dysfunction   Ice Chips Ice chips: Not tested   Thin Liquid Thin Liquid: Impaired Presentation: Cup;Self Fed Pharyngeal  Phase Impairments: Multiple swallows;Cough - Immediate    Nectar Thick Nectar Thick Liquid: Impaired Presentation:  Cup;Self Fed Pharyngeal Phase Impairments: Multiple swallows   Honey Thick Honey Thick Liquid: Not tested   Puree Puree: Impaired Presentation: Self Fed;Spoon Pharyngeal Phase Impairments: Multiple swallows Other Comments: pt takes very small boluses - 1/4 tsp amounts   Solid     Solid: Not tested Other Comments: DNT due to pt's progressive dysphagia to solids more than liquids      Macario Golds 05/27/2020,10:06 AM  Kathleen Lime, MS [facility] SLP [place] Office [phone] Pager [phone]

## 2020-05-27 NOTE — ED Provider Notes (Signed)
[facility] DEPT Provider Note   CSN: [id number] Arrival date & time: 05/26/20  1727   History Chief Complaint  Patient presents with   Abnormal ECG   elevated heart rate   Weight Loss    [name] Wright is a 79 y.o. female.  The history is provided by the patient and a relative.  She has history of hypertension, diabetes, hyperlipidemia, coronary artery disease, weight loss and was referred by her primary care provider because of rapid heartbeat, swallowing difficulty, weight loss.  She has lost at least 30 pounds over the last year.  She has difficulty swallowing, especially water.  She has been taking nutritional supplements to try to keep her weight up.  She has had a hoarse voice which started about 6-8 months ago.  There has been no fever, chills, sweats.  There has been no vomiting or diarrhea.  She does have history of passive smoke exposure although she was never a smoker.  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   Right Arm Below Elbow   Cancer ([facility])    skin and eye   Constipation    Coronary artery disease    NUCLEAR STRESS TEST, 09/30/2009 - EKG negative for ischemia   Depression    Diabetes mellitus without complication ([place])    Treated by diet   Essential tremor    hands, sometimes legs    Family history of coronary artery disease    Mother died 65   GERD (gastroesophageal reflux disease)    H/O subconjunctival hemorrhage    Heart attack ([place])    History of blood transfusion    History of failure to thrive syndrome    History of weakness    Hyperlipidemia    Hypertension    Keratoacanthoma 01/05/2011   Left Forearm   Lumbar back pain with radiculopathy affecting lower extremity    Myocardial infarction ([place]) 2006   Neck pain    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/2014    Left Chin   Osteopenia    Presence of pessary    Retroperitoneal hematoma    S/P   Schatzki's ring    Shingles    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   Left Upper Bicep   Superficial nodular basal cell carcinoma (BCC) 03/02/2016   Left Shoulder   Trapezius muscle spasm    right side   Uterovaginal prolapse    Weight loss     Patient Active Problem List   Diagnosis Date Noted   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 ([place]) 04/21/2020   Hoarseness of voice 04/21/2020   Weight loss, unintentional 04/21/2020   Fatigue 04/21/2020   Frailty syndrome in geriatric patient 04/21/2020   Chronic low back pain without sciatica 04/21/2020   Aspirin intolerance 06/27/2019   Failure to thrive (0-17) 02/19/2017   Shingles right face and eye 02/19/2017   CAD- RCA DES 2006    Dyslipidemia, goal LDL below 70    Essential hypertension    Non-insulin treated type 2 diabetes mellitus ([place])     Past Surgical History:  Procedure Laterality Date   CATARACT EXTRACTION, BILATERAL     COLONOSCOPY     CORONARY ANGIOPLASTY WITH STENT PLACEMENT  11/05/2004   RCA DES   DILATION AND CURETTAGE OF UTERUS     LID LESION EXCISION  Left 02/14/2018   Procedure: LEFT LID LESION EXCISION;  Surgeon: Clista Bernhardt, MD;  Location: [facility];  Service: Ophthalmology;  Laterality: Left;   RECONSTRUCTION OF EYELID Left 02/14/2018   Procedure: TOTAL RECONSTRUCTION OF EYELID;  Surgeon: Clista Bernhardt, MD;  Location: [facility];  Service: Ophthalmology;  Laterality: Left;   SKIN FULL THICKNESS GRAFT Left 02/14/2018   Procedure: SKIN GRAFT FULL THICKNESS LEFT EYE;  Surgeon: Clista Bernhardt, MD;  Location: [facility];  Service: Ophthalmology;  Laterality: Left;   TONSILECTOMY/ADENOIDECTOMY WITH MYRINGOTOMY     TUBAL LIGATION       OB History   No obstetric history on file.     Family History  Problem Relation Age of Onset   Heart attack Mother 22    Cancer Father        Colon   Heart attack Maternal Grandmother 14   High blood pressure Son    High Cholesterol Son    Depression Son    Depression Daughter    Depression Daughter    Depression Son     Social History   Tobacco Use   Smoking status: Never Smoker   Smokeless tobacco: Never Used  Scientific laboratory technician Use: Never used  Substance Use Topics   Alcohol use: Not Currently    Alcohol/week: 0.0 standard drinks   Drug use: No    Home Medications Prior to Admission medications   Medication Sig Start Date End Date Taking? Authorizing Provider  acetaminophen (TYLENOL) 500 MG tablet Take 1,000 mg by mouth as needed.    [provider]  amLODipine (NORVASC) 2.5 MG tablet Take 1 tablet (2.5 mg total) by mouth daily. 07/12/19   Patwardhan, [name], MD  Aspirin Buf,CaCarb-MgCarb-MgO, 81 MG TABS Take 81 mg by mouth daily.     [provider]  Coenzyme Q10 100 MG capsule Take 100 mg by mouth daily.     [provider]  Difluprednate 0.05 % EMUL Place 1 drop into the right eye daily.     [provider]  DULoxetine (CYMBALTA) 30 MG capsule Take 30 mg by mouth 2 (two) times daily.     [provider]  ertugliflozin L-PyroglutamicAc (STEGLATRO) 5 MG TABS tablet Take 5 mg by mouth daily.    [provider]  fluticasone (FLONASE) 50 MCG/ACT nasal spray Place 2 sprays into both nostrils daily as needed for allergies or rhinitis.    [provider]  loratadine (CLARITIN) 10 MG tablet Take 10 mg by mouth every other day.    [provider]  metFORMIN (GLUCOPHAGE-XR) 500 MG 24 hr tablet Take 1,000 mg by mouth 2 (two) times daily.     [provider]  nitroGLYCERIN (NITROSTAT) 0.4 MG SL tablet Place 1 tablet (0.4 mg total) under the tongue every 5 (five) minutes as needed for chest pain. 11/18/19   Patwardhan, [name], MD  pravastatin (PRAVACHOL) 20 MG tablet Take 1 tablet (20 mg total) by mouth  every evening. 05/06/19   Patwardhan, [name], MD  Propylene Glycol 0.6 % SOLN Place 1 drop into both eyes as needed.     [provider]    Allergies    Avelox [moxifloxacin hcl in nacl], Altace [ramipril], Hctz [hydrochlorothiazide], Lisinopril, Losartan, Statins, and Sulfa antibiotics  Review of Systems   Review of Systems  All other systems reviewed and are negative.   Physical Exam Updated Vital Signs BP 138/79    Pulse 100  Temp 97.9 F (36.6 C) (Oral)    Resp (!) 29    Ht 5\' 2"  (1.575 m)    Wt 38.1 kg    SpO2 99%    BMI 15.36 kg/m   Physical Exam Vitals and nursing note reviewed.   Somewhat cachectic 79 year old female, resting comfortably and in no acute distress. Vital signs are significant for elevated respiratory rate. Oxygen saturation is 99%, which is normal. Head is normocephalic and atraumatic. PERRLA, EOMI. Oropharynx is clear.  Voice is hoarse. Neck is nontender and supple without adenopathy or JVD. Back is nontender and there is no CVA tenderness. Lungs are clear without rales, wheezes, or rhonchi. Chest is nontender. Heart has regular rate and rhythm without murmur. Abdomen is soft, flat, nontender without masses or hepatosplenomegaly and peristalsis is normoactive. Extremities have no cyanosis or edema, full range of motion is present. Skin is warm and dry without rash. Neurologic: Mental status is normal, cranial nerves are intact, there are no motor or sensory deficits.  ED Results / Procedures / Treatments   Labs (all labs ordered are listed, but only abnormal results are displayed) Labs Reviewed  BASIC METABOLIC PANEL - Abnormal; Notable for the following components:      Result Value   Glucose, Bld 245 (*)    Creatinine, Ser 0.40 (*)    All other components within normal limits  RESP PANEL BY RT-PCR (FLU A&B, COVID) ARPGX2  CBC  HEPATIC FUNCTION PANEL  TSH  TROPONIN I (HIGH SENSITIVITY)  TROPONIN I (HIGH SENSITIVITY)      Radiology DG Chest 2 View  Result Date: 05/26/2020 CLINICAL DATA:  Tachycardia. EXAM: CHEST - 2 VIEW COMPARISON:  Chest x-ray dated February 19, 2017. FINDINGS: The heart size and mediastinal contours are within normal limits. Both lungs are clear. The visualized skeletal structures are unremarkable. IMPRESSION: No active cardiopulmonary disease. Electronically Signed   By: Titus Dubin M.D.   On: 05/26/2020 19:17   CT Soft Tissue Neck Wo Contrast  Result Date: 05/27/2020 CLINICAL DATA:  Hoarseness. EXAM: CT NECK WITHOUT CONTRAST TECHNIQUE: Multidetector CT imaging of the neck was performed following the standard protocol without intravenous contrast. COMPARISON:  None. FINDINGS: Pharynx and larynx: Mildly motion degraded study, particularly at the supraglottic level. Contour abnormality of the posterior right hypopharynx is suspected to be a retropharyngeal internal carotid artery, but difficult to be sure without IV contrast. No other pharyngeal or laryngeal abnormality. Salivary glands: No inflammation, mass, or stone. Thyroid: Normal. Lymph nodes: None enlarged or abnormal density. Vascular: Aberrant right subclavian artery. Limited intracranial: Negative. Visualized orbits: Negative. Mastoids and visualized paranasal sinuses: Clear. Skeleton: No acute or aggressive process. Upper chest: Negative. Other: None. IMPRESSION: 1. Mildly motion degraded study, particularly at the level of the supraglottic larynx. 2. Contour abnormality of the posterior right hypopharynx is suspected to be a retropharyngeal internal carotid artery, but difficult to be sure without IV contrast. Otherwise normal appearance of the pharynx and larynx. 3. Aberrant right subclavian artery. Electronically Signed   By: Ulyses Jarred M.D.   On: 05/27/2020 01:28   CT Chest Wo Contrast  Result Date: 05/27/2020 CLINICAL DATA:  Unintentional weight loss, intermittent dyspnea EXAM: CT CHEST WITHOUT CONTRAST TECHNIQUE:  Multidetector CT imaging of the chest was performed following the standard protocol without IV contrast. COMPARISON:  None. FINDINGS: Cardiovascular: Extensive multi-vessel coronary artery calcification. Global cardiac size within normal limits. No pericardial effusion. Central pulmonary arteries are of normal caliber. Variant arch anatomy is identified  with aberrant origin of the right subclavian artery and common origin of the common carotid arteries. Mild dilation of the aortic arch is identified measuring 3.1 cm in greatest dimension beyond the takeoff of the left subclavian artery. The ascending aorta and more distal descending aorta are of normal caliber. Moderate atherosclerotic calcification within the thoracic aorta. Mediastinum/Nodes: No enlarged mediastinal or axillary lymph nodes. Thyroid gland, trachea, and esophagus demonstrate no significant findings. Lungs/Pleura: 6 mm ground-glass pulmonary nodule, right upper lobe, image # 49. 4 mm noncalcified pulmonary nodule, right lower lobe, image # 70. No other focal pulmonary nodules or infiltrates. No pneumothorax or pleural effusion. Central airways are widely patent. Upper Abdomen: Tiny cyst within the visualized left hepatic lobe. No acute abnormality. Musculoskeletal: Osseous structures appear osteopenic. No acute bone abnormality. No focal lytic or blastic bone lesions. IMPRESSION: No definite radiographic explanation for the patient's reported weight loss. Multiple pulmonary nodules. Initial follow-up with CT at 6-12 months is recommended to confirm persistence. If persistent, repeat CT is recommended every 2 years until 5 years of stability has been established. This recommendation follows the consensus statement: Guidelines for Management of Incidental Pulmonary Nodules Detected on CT Images: From the Fleischner Society 2017; Radiology 2017; 284:228-243. 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 Recommend  annual imaging followup by CTA or MRA. This recommendation follows 2010 ACCF/AHA/AATS/ACR/ASA/SCA/SCAI/SIR/STS/SVM Guidelines for the Diagnosis and Management of Patients with Thoracic Aortic Disease. Circulation.2010; 121: S505-L976. Aortic aneurysm NOS (ICD10-I71.9) Aortic Atherosclerosis (ICD10-I70.0). Electronically Signed   By: Fidela Salisbury MD   On: 05/27/2020 01:32    Procedures Procedures   Medications Ordered in ED Medications - No data to display  ED Course  I have reviewed the triage vital signs and the nursing notes.  Pertinent labs & imaging results that were available during my care of the patient were reviewed by me and considered in my medical decision making (see chart for details).  MDM Rules/Calculators/A&P Weight loss, hoarseness, swallowing difficulty concerning for possible occult malignancy such as esophageal cancer.  History of's passive smoke exposure, also need to consider possibility of occult lung cancer.  Chest x-ray obtained in the ED was normal.  Old records reviewed, and she had normal thyroid studies 1 month ago and also had normal hepatic function studies.  Labs today are reassuring, will add hepatic function studies again and check thyroid studies.  Given hoarseness and weight loss, will check CT of neck and chest.  She will likely need both ENT and GI consultations.  Son is hoping that she would be able to be admitted for evaluation.  Patient failed swallow screen.  CT scans did show multiple pulmonary nodules which will need to be followed with outpatient CT scan, also mild dilatation of the aortic arch which needs to be followed as an outpatient.  Case is discussed with Dr. Hal Hope of [facility], who agrees to admit the patient.  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

## 2020-05-27 NOTE — ED Notes (Signed)
RN busy at this time and will call back to receive report.

## 2020-05-27 NOTE — Progress Notes (Signed)
Same day note  Patient seen and examined at bedside.  Patient was admitted to the hospital for progressive dysphagia, hoarseness, nasal intonation and progressive mobility issues.  At the time of my evaluation, patient complains of pressure mostly to solids, feeling of sensation impaired appetite   Physical examination reveals thinly built female nasal voice, hoarse voice, thinly built muscles, gait was not tested.  Laboratory data and imaging was reviewed  Assessment and Plan. Dysphagia hoarseness of voice significant weight loss, nasal intonation, fasciculations progressive difficulty with mobility.  Possibility of motor neuron disease/neuromuscular disorder.  Unlikely to be local esophageal pathology.  Seen by speech and swallow.  Recommend dysphagia 3 diet.  Communicated with neurology for consultation, Dr Curly [place]. Continue gentle IV fluids.  Follow neurology recommendations.  Essential hypertension.  As needed labetalol IV for now.  On amlodipine at home.  Diabetes mellitus type 2.  Continue sliding scale insulin for now.  Hold oral hypoglycemic agents.  History of coronary artery disease status post stent.  Hold statins for now.  History of pulmonary nodule.  Will need outpatient follow-up.  Family communication.  Spoke with the patient's son at bedside.  No Charge  Signed,  Delila Pereyra, MD [facility]

## 2020-05-27 NOTE — Consult Note (Signed)
NEURO HOSPITALIST CONSULT NOTE   Requestig physician: Dr. Louanne Belton  Reason for Consult: Dysphagia  History obtained from:   Patient and Chart    HPI:                                                                                                                                          [name] Wright is an 79 y.o. female with a PMHx of DM2, HLD, HTN, CAD s/p stenting, aortic atherosclerosis, Bowen's disease of RUE, skin and eye cancer, depression, essential tremor and lumbar radiculopathy with back pain and lower extremity symptoms who presented to he hospital today with an 8-9 month history of progressive difficulty swallowing with hoarseness. She has had resulting weight loss. She has had difficulty swallowing solids more than liquids. She had an ultrasound of the thyroid last month which was unremarkable. When she followed up with her primary care physician today she was found to be tachycardic and was sent to the ER.  She stated that she is able to swallow but has to really put in a lot of effort for the solids. No associated ptosis or extremity weakness. .  In the ED, CT chest and neck in the ED was largely unremarkable except for some pulmonary nodules.  Labs were unremarkable except for hyperglycemia. Bedside swallow was done which patient failed.   After admission, it was noted that patient's speech was with nasal intonation, and tongue fasciculations were also observed. The patient also endorsed progressive difficulty with mobility. Primary team is considering possibility of motor neuron disease versus a neuromuscular disorder. It was felt unlikely by them for her condition to be due to local esophageal pathology. She is now on a dysphagia 3 diet.    Today, the patient also failed NIF testing with RT, being unable to inhale deeply without coughing and clearing her throat; the best result was an FVC=0.6L/s.  Neurology has been consulted for further recommendations  regarding work up of her dysphagia.    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   Right Arm Below Elbow   Cancer ([facility])    skin and eye   Constipation    Coronary artery disease    NUCLEAR STRESS TEST, 09/30/2009 - EKG negative for ischemia   Depression    Diabetes mellitus without complication (H[facility])    Treated by diet   Essential tremor    hands, sometimes legs    Family history of coronary artery disease    Mother died 101   GERD (gastroesophageal reflux disease)    H/O subconjunctival hemorrhage    Heart attack ([place])    History of blood transfusion  History of failure to thrive syndrome    History of weakness    Hyperlipidemia    Hypertension    Keratoacanthoma 01/05/2011   Left Forearm   Lumbar back pain with radiculopathy affecting lower extremity    Myocardial infarction [facility]) 2006   Neck pain    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/2014   Left Chin   Osteopenia    Presence of pessary    Retroperitoneal hematoma    S/P   Schatzki's ring    Shingles    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   Left Upper Bicep   Superficial nodular basal cell carcinoma (BCC) 03/02/2016   Left Shoulder   Trapezius muscle spasm    right side   Uterovaginal prolapse    Weight loss     Past Surgical History:  Procedure Laterality Date   CATARACT EXTRACTION, BILATERAL     COLONOSCOPY     CORONARY ANGIOPLASTY WITH STENT PLACEMENT  11/05/2004   RCA DES   DILATION AND CURETTAGE OF UTERUS     LID LESION EXCISION Left 02/14/2018   Procedure: LEFT LID LESION EXCISION;  Surgeon: Clista Bernhardt, MD;  Location: [place];  Service: Ophthalmology;  Laterality: Left;   RECONSTRUCTION OF EYELID Left 02/14/2018   Procedure: TOTAL RECONSTRUCTION OF EYELID;  Surgeon: Clista Bernhardt, MD;  Location: [place];  Service:  Ophthalmology;  Laterality: Left;   SKIN FULL THICKNESS GRAFT Left 02/14/2018   Procedure: SKIN GRAFT FULL THICKNESS LEFT EYE;  Surgeon: Clista Bernhardt, MD;  Location: [place];  Service: Ophthalmology;  Laterality: Left;   TONSILECTOMY/ADENOIDECTOMY WITH MYRINGOTOMY     TUBAL LIGATION      Family History  Problem Relation Age of Onset   Heart attack Mother 36   Cancer Father        Colon   Heart attack Maternal Grandmother 74   High blood pressure Son    High Cholesterol Son    Depression Son    Depression Daughter    Depression Daughter    Depression Son               Social History:  reports that she has never smoked. She has never used smokeless tobacco. She reports previous alcohol use. She reports that she does not use drugs.  Allergies  Allergen Reactions   Avelox [Moxifloxacin Hcl In Nacl] Anaphylaxis, Nausea Only and Other (See Comments)    Throat and tongue swelling  (Critical)   Altace [Ramipril] Other (See Comments)    Tremors and increased tinnitus   Hctz [Hydrochlorothiazide] Other (See Comments)    Palpitations and tremors   Lisinopril Other (See Comments)    Headaches   Losartan Other (See Comments)    Palpitations and tremors   Statins Other (See Comments)    Increased blood pressure   Sulfa Antibiotics Rash    MEDICATIONS:  Prior to Admission:  Medications Prior to Admission  Medication Sig Dispense Refill Last Dose   acetaminophen (TYLENOL) 500 MG tablet Take 1,000 mg by mouth as needed for mild pain.   Past Month at Unknown time   amLODipine (NORVASC) 2.5 MG tablet Take 1 tablet (2.5 mg total) by mouth daily. 90 tablet 3 05/26/2020 at Unknown time   Aspirin Buf,CaCarb-MgCarb-MgO, 81 MG TABS Take 81 mg by mouth daily.    05/26/2020 at Unknown time   Coenzyme Q10 100 MG capsule Take 100 mg by mouth daily.     05/26/2020 at Unknown time   Difluprednate 0.05 % EMUL Place 1 drop into the right eye daily.    05/26/2020 at Unknown time   DULoxetine (CYMBALTA) 30 MG capsule Take 30 mg by mouth 2 (two) times daily.    05/26/2020 at Unknown time   ertugliflozin L-PyroglutamicAc (STEGLATRO) 5 MG TABS tablet Take 5 mg by mouth daily.   05/26/2020 at Unknown time   fluticasone (FLONASE) 50 MCG/ACT nasal spray Place 2 sprays into both nostrils daily as needed for allergies or rhinitis.   05/26/2020 at Unknown time   loratadine (CLARITIN) 10 MG tablet Take 10 mg by mouth every other day.   05/26/2020 at Unknown time   metFORMIN (GLUCOPHAGE-XR) 500 MG 24 hr tablet Take 1,000 mg by mouth 2 (two) times daily.    05/26/2020 at AM   nitroGLYCERIN (NITROSTAT) 0.4 MG SL tablet Place 1 tablet (0.4 mg total) under the tongue every 5 (five) minutes as needed for chest pain. 25 tablet 3 not used   polyethylene glycol (MIRALAX / GLYCOLAX) 17 g packet Take 17 g by mouth every other day.   Past Week at Unknown time   pravastatin (PRAVACHOL) 20 MG tablet Take 1 tablet (20 mg total) by mouth every evening. 90 tablet 3 Past Week at Unknown time   Propylene Glycol 0.6 % SOLN Place 1 drop into both eyes as needed (dry eyes).   05/26/2020 at Unknown time   Scheduled:  insulin aspart  0-9 Units Subcutaneous Q4H   Continuous:    ROS:                                                                                                                                       Denies vision loss, aphasia, confusion, sensory numbness, headache or chest pain. Other ROS as per HPI.    Blood pressure 132/66, pulse 98, temperature 97.7 F (36.5 C), temperature source Oral, resp. rate 18, height 5\' 2"  (1.575 m), weight 38.1 kg, SpO2 99 %.   General Examination:  Physical Exam  HEENT-  [place]/AT    Lungs- Respirations  unlabored Extremities- No edema  Neurological Examination Mental Status: Awake and alert. Normal attention. Speech with significant lingual dysarthria and is also hypophonic with a component of hoarseness. Nasal quality noted. In this context, her verbal expression is fluent with intact naming and comprehension.  Cranial Nerves: II: Visual fields intact with no extinction to DSS. PERRL.   III,IV, VI: Right sided ptosis is chronic per patient. No fatigueability of levator palpebrae or extraocular movements noted. Mild nystagmus on end horizontal gaze to left and right that extinguishes. Able to bury sclerae bilaterally.  V,VII: Weak smile is symmetric. Facial temp sensation normal bilaterally VIII: Hearing intact to conversation IX,X: Hypophonic and hoarse speech. Palate elevates symmetrically but without full vertical excursion.  XI: Symmetric shoulder shrug. Cannot lift head antigravity with neck flexion when lying supine.  XII: Midline tongue extension with prominent fasciculations of entire tongue.  Motor: RUE 4/5 proximally and distally.  LUE 4+/5 proximally and distally RLE 4-/5 HF, 4+/5 KE, 3/5 ADF/APF LLE 3/5 HF, 4-/5 KE, 2/5 ADF/APF No fasciculations of limb muscles noted.  There is some wasting of the muscles diffusely, worse in the hands and feet, including the FDI bilaterally.  Sensory: Temp and light touch intact throughout, bilaterally, proximally and distally.  Deep Tendon Reflexes:  1+ right triceps, biceps and brachioradialis 3+ left triceps, biceps and brachioradialis 3+ bilateral patellae 0 bilateral achilles Right toe upgoing, left toe equivocal Negative Hoffman's bilaterally  Cerebellar: No ataxia with FNF bilaterally Gait: Weakness noted, requiring walker   Lab Results: Basic Metabolic Panel: Recent Labs  Lab 05/26/20 1752  NA 136  K 4.0  CL 101  CO2 26  GLUCOSE 245*  BUN 15  CREATININE 0.40*  CALCIUM 9.0    CBC: Recent Labs  Lab 05/26/20 1752   WBC 10.1  HGB 14.3  HCT 43.9  MCV 91.5  PLT 367    Cardiac Enzymes: No results for input(s): CKTOTAL, CKMB, CKMBINDEX, TROPONINI in the last 168 hours.  Lipid Panel: No results for input(s): CHOL, TRIG, HDL, CHOLHDL, VLDL, LDLCALC in the last 168 hours.  Imaging: DG Chest 2 View  Result Date: 05/26/2020 CLINICAL DATA:  Tachycardia. EXAM: CHEST - 2 VIEW COMPARISON:  Chest x-ray dated February 19, 2017. FINDINGS: The heart size and mediastinal contours are within normal limits. Both lungs are clear. The visualized skeletal structures are unremarkable. IMPRESSION: No active cardiopulmonary disease. Electronically Signed   By: Titus Dubin M.D.   On: 05/26/2020 19:17   CT Soft Tissue Neck Wo Contrast  Result Date: 05/27/2020 CLINICAL DATA:  Hoarseness. EXAM: CT NECK WITHOUT CONTRAST TECHNIQUE: Multidetector CT imaging of the neck was performed following the standard protocol without intravenous contrast. COMPARISON:  None. FINDINGS: Pharynx and larynx: Mildly motion degraded study, particularly at the supraglottic level. Contour abnormality of the posterior right hypopharynx is suspected to be a retropharyngeal internal carotid artery, but difficult to be sure without IV contrast. No other pharyngeal or laryngeal abnormality. Salivary glands: No inflammation, mass, or stone. Thyroid: Normal. Lymph nodes: None enlarged or abnormal density. Vascular: Aberrant right subclavian artery. Limited intracranial: Negative. Visualized orbits: Negative. Mastoids and visualized paranasal sinuses: Clear. Skeleton: No acute or aggressive process. Upper chest: Negative. Other: None. IMPRESSION: 1. Mildly motion degraded study, particularly at the level of the supraglottic larynx. 2. Contour abnormality of the posterior right hypopharynx is suspected to be a retropharyngeal internal carotid artery, but difficult to be sure without IV  contrast. Otherwise normal appearance of the pharynx and larynx. 3. Aberrant  right subclavian artery. Electronically Signed   By: Ulyses Jarred M.D.   On: 05/27/2020 01:28   CT Chest Wo Contrast  Result Date: 05/27/2020 CLINICAL DATA:  Unintentional weight loss, intermittent dyspnea EXAM: CT CHEST WITHOUT CONTRAST TECHNIQUE: Multidetector CT imaging of the chest was performed following the standard protocol without IV contrast. COMPARISON:  None. FINDINGS: Cardiovascular: Extensive multi-vessel coronary artery calcification. Global cardiac size within normal limits. No pericardial effusion. Central pulmonary arteries are of normal caliber. Variant arch anatomy is identified with aberrant origin of the right subclavian artery and common origin of the common carotid arteries. Mild dilation of the aortic arch is identified measuring 3.1 cm in greatest dimension beyond the takeoff of the left subclavian artery. The ascending aorta and more distal descending aorta are of normal caliber. Moderate atherosclerotic calcification within the thoracic aorta. Mediastinum/Nodes: No enlarged mediastinal or axillary lymph nodes. Thyroid gland, trachea, and esophagus demonstrate no significant findings. Lungs/Pleura: 6 mm ground-glass pulmonary nodule, right upper lobe, image # 49. 4 mm noncalcified pulmonary nodule, right lower lobe, image # 70. No other focal pulmonary nodules or infiltrates. No pneumothorax or pleural effusion. Central airways are widely patent. Upper Abdomen: Tiny cyst within the visualized left hepatic lobe. No acute abnormality. Musculoskeletal: Osseous structures appear osteopenic. No acute bone abnormality. No focal lytic or blastic bone lesions. IMPRESSION: No definite radiographic explanation for the patient's reported weight loss. Multiple pulmonary nodules. Initial follow-up with CT at 6-12 months is recommended to confirm persistence. If persistent, repeat CT is recommended every 2 years until 5 years of stability has been established. This recommendation follows the  consensus statement: Guidelines for Management of Incidental Pulmonary Nodules Detected on CT Images: From the Fleischner Society 2017; Radiology 2017; 284:228-243. 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 Recommend annual imaging followup by CTA or MRA. This recommendation follows 2010 ACCF/AHA/AATS/ACR/ASA/SCA/SCAI/SIR/STS/SVM Guidelines for the Diagnosis and Management of Patients with Thoracic Aortic Disease. Circulation.2010; 121: Z610-R604. Aortic aneurysm NOS (ICD10-I71.9) Aortic Atherosclerosis (ICD10-I70.0). Electronically Signed   By: Fidela Salisbury MD   On: 05/27/2020 01:32   DG Swallowing Func-Speech Pathology  Result Date: 05/27/2020 Objective Swallowing Evaluation: Type of Study: MBS-Modified Barium Swallow Study  Patient Details Name: ULDINE FUSTER MRN: [id number] Date of Birth: 07-09-1940 Today's Date: 05/27/2020 Time: SLP Start Time (ACUTE ONLY): 55 -SLP Stop Time (ACUTE ONLY): 4782 SLP Time Calculation (min) (ACUTE ONLY): 20 min Past Medical History: Past Medical History: Diagnosis Date  Aortic atherosclerosis ([place])   BCC (basal cell carcinoma of skin) 06/24/1993  Left Shoulder  Bowen's disease 07/11/1996  Right Arm Below Elbow  Cancer ([place])   skin and eye  Constipation   Coronary artery disease   NUCLEAR STRESS TEST, 09/30/2009 - EKG negative for ischemia  Depression   Diabetes mellitus without complication (HCC)   Treated by diet  Essential tremor   hands, sometimes legs   Family history of coronary artery disease   Mother died 20  GERD (gastroesophageal reflux disease)   H/O subconjunctival hemorrhage   Heart attack ([place])   History of blood transfusion   History of failure to thrive syndrome   History of weakness   Hyperlipidemia   Hypertension   Keratoacanthoma 01/05/2011  Left Forearm  Lumbar back pain with radiculopathy affecting lower extremity   Myocardial infarction ([place]) 2006  Neck pain   Nodular basal cell carcinoma  (BCC) 09/02/2014  Left Chin  Osteopenia   Presence of pessary   Retroperitoneal hematoma   S/P  Schatzki's ring   Shingles   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  Left Upper Bicep  Superficial nodular basal cell carcinoma (BCC) 03/02/2016  Left Shoulder  Trapezius muscle spasm   right side  Uterovaginal prolapse   Weight loss  Past Surgical History: Past Surgical History: Procedure Laterality Date  CATARACT EXTRACTION, BILATERAL    COLONOSCOPY    CORONARY ANGIOPLASTY WITH STENT PLACEMENT  11/05/2004  RCA DES  DILATION AND CURETTAGE OF UTERUS    LID LESION EXCISION Left 02/14/2018  Procedure: LEFT LID LESION EXCISION;  Surgeon: Clista Bernhardt, MD;  Location: [place];  Service: Ophthalmology;  Laterality: Left;  RECONSTRUCTION OF EYELID Left 02/14/2018  Procedure: TOTAL RECONSTRUCTION OF EYELID;  Surgeon: Clista Bernhardt, MD;  Location: [place];  Service: Ophthalmology;  Laterality: Left;  SKIN FULL THICKNESS GRAFT Left 02/14/2018  Procedure: SKIN GRAFT FULL THICKNESS LEFT EYE;  Surgeon: Clista Bernhardt, MD;  Location: [place];  Service: Ophthalmology;  Laterality: Left;  TONSILECTOMY/ADENOIDECTOMY WITH MYRINGOTOMY    TUBAL LIGATION   HPI: Pt is an 79 yo female with ED visit due to progressive dysphagia, weakness and significant weight loss over the last year.  Pt has hoarseness and dysarthria.  She also admits to progressive weakness overall.  PMH also + for essential tremor, MI, Schaztki's rin, Shingles, GERD, CAD s/p stenting.   CXR negative for acute findings.  Pt failed RN [place] and thus SLP was ordered.  Pt hypernasal and dysarthric and reports that her speech has been difficult to understand over the last few months thus she does not speak on the phone.  She denies requiring heimlich manuever.  Pt admits to gustatory changes and progressive dysphagia to food more than liquids.  She also states she must be careful taking pills - pointing to near vallecular region and pyriform to  indicate area of sensation of residuals.  Subjective: pt awake on flouro chair with son present Assessment / Plan / Recommendation CHL IP CLINICAL IMPRESSIONS 05/27/2020 Clinical Impression Mild oropharyngeal dysphagia with sensorimotor deficits. . Decreased oropharyngeal motility results in mild pharyngeal residuals and laryngeal penetration/trace aspiration of liquids. Cued cough was weak and not effective to clear aspirates.  Dry swallows helpful to decrease pharyngeal retention. Postures of head turn right/left did not aid pharyngeal clearance (turn to the right worsened clearance significantly) and chin tuck not tested due to pt's discomfort with position.  Palatal deviation to right upon phonation noted thus concern for pharyngeal weakness on left.  Pt only tested in sagittal view due to physical limitations.  Pt takes very small bites/sips to compensate (1/8 tsp during testing) and this has certainly contributed to her weight loss.  She declined to swallow a barium tablet when given with options of taking with puree or thin. Question source of dysphagia - ? Neuro, ? Indication for imaging to help elucidate source.  Recommend Dys3/thin diet with precautions.    Consider medications crushed if large and give with applesauce.  (start and follow with liquids)  Advise pt drink water with meals due to her trace aspiration.    Multiple swallows per bite/sip and follow solid with liquid.    Maximize liquid nutrition - pt likes chocolate Boost.    Stop intake if pt coughing with intake. SLP Visit Diagnosis Dysphagia, oropharyngeal phase (R13.12) Attention and concentration deficit following -- Frontal lobe and executive function deficit following --  Impact on safety and function Mild aspiration risk   CHL IP TREATMENT RECOMMENDATION 05/27/2020 Treatment Recommendations Therapy as outlined in treatment plan below   Prognosis 05/27/2020 Prognosis for Safe Diet Advancement Fair Barriers to Reach Goals Time post  onset;Other (Comment) Barriers/Prognosis Comment -- CHL IP DIET RECOMMENDATION 05/27/2020 SLP Diet Recommendations -- Liquid Administration via Cup Medication Administration Crushed with puree Compensations Slow rate;Small sips/bites;Follow solids with liquid Postural Changes Remain semi-upright after after feeds/meals (Comment);Seated upright at 90 degrees   CHL IP OTHER RECOMMENDATIONS 05/27/2020 Recommended Consults -- Oral Care Recommendations Oral care BID Other Recommendations --   CHL IP FOLLOW UP RECOMMENDATIONS 05/27/2020 Follow up Recommendations Other (comment)   CHL IP FREQUENCY AND DURATION 05/27/2020 Speech Therapy Frequency (ACUTE ONLY) min 1 x/week Treatment Duration 1 week      CHL IP ORAL PHASE 05/27/2020 Oral Phase Impaired Oral - Pudding Teaspoon -- Oral - Pudding Cup -- Oral - Honey Teaspoon -- Oral - Honey Cup -- Oral - Nectar Teaspoon -- Oral - Nectar Cup WFL Oral - Nectar Straw -- Oral - Thin Teaspoon -- Oral - Thin Cup WFL Oral - Thin Straw WFL Oral - Puree Delayed oral transit;Piecemeal swallowing Oral - Mech Soft Delayed oral transit;Reduced posterior propulsion;Impaired mastication;Weak lingual manipulation Oral - Regular -- Oral - Multi-Consistency -- Oral - Pill -- Oral Phase - Comment --  CHL IP PHARYNGEAL PHASE 05/27/2020 Pharyngeal Phase Impaired Pharyngeal- Pudding Teaspoon -- Pharyngeal -- Pharyngeal- Pudding Cup -- Pharyngeal -- Pharyngeal- Honey Teaspoon -- Pharyngeal -- Pharyngeal- Honey Cup -- Pharyngeal -- Pharyngeal- Nectar Teaspoon -- Pharyngeal -- Pharyngeal- Nectar Cup Penetration/Aspiration during swallow;Reduced epiglottic inversion;Reduced airway/laryngeal closure;Reduced tongue base retraction Pharyngeal Material enters airway, remains ABOVE vocal cords and not ejected out Pharyngeal- Nectar Straw -- Pharyngeal -- Pharyngeal- Thin Teaspoon -- Pharyngeal -- Pharyngeal- Thin Cup Reduced epiglottic inversion;Reduced laryngeal elevation;Reduced airway/laryngeal  closure;Reduced tongue base retraction;Penetration/Aspiration during swallow;Penetration/Apiration after swallow;Trace aspiration Pharyngeal Material enters airway, CONTACTS cords and not ejected out Pharyngeal- Thin Straw Reduced airway/laryngeal closure;Reduced laryngeal elevation;Penetration/Aspiration during swallow;Penetration/Apiration after swallow Pharyngeal Material enters airway, CONTACTS cords and not ejected out Pharyngeal- Puree Reduced epiglottic inversion;Pharyngeal residue - valleculae Pharyngeal Material does not enter airway Pharyngeal- Mechanical Soft Reduced epiglottic inversion;Pharyngeal residue - valleculae Pharyngeal Material does not enter airway Pharyngeal- Regular -- Pharyngeal -- Pharyngeal- Multi-consistency -- Pharyngeal -- Pharyngeal- Pill -- Pharyngeal -- Pharyngeal Comment --  CHL IP CERVICAL ESOPHAGEAL PHASE 05/27/2020 Cervical Esophageal Phase WFL Pudding Teaspoon -- Pudding Cup -- Honey Teaspoon -- Honey Cup -- Nectar Teaspoon -- Nectar Cup -- Nectar Straw -- Thin Teaspoon -- Thin Cup -- Thin Straw -- Puree -- Mechanical Soft -- Regular -- Multi-consistency -- Pill -- Cervical Esophageal Comment -- Kathleen Lime, MS [facility] SLP Acute Rehab Services Office [phone] Pager [phone] Macario Golds 05/27/2020, 2:32 PM               Assessment: 79 year old female who presented to he hospital today with an 8-9 month history of progressive difficulty swallowing, with difficulty swallowing solids more than liquids, as well as hoarseness. She has had resulting weight loss  She states that she is able to swallow but has to really put in a lot of effort for the solids. After admission, it was noted that patient's speech was with nasal intonation, and tongue fasciculations were also observed. The patient also endorsed progressive difficulty with mobility. It was felt unlikely by primary team for her condition to be due to local esophageal pathology. She is  now on a dysphagia 3 diet.  Today, the patient also failed NIF testing with RT, being unable to inhale deeply without coughing and clearing her throat; the best result was an FVC=0.6L/s. Neurology has been consulted for further recommendations regarding work up of her dysphagia, with motor neuron disease and NMJ disorder being significant components of the DDx.  1. Exam reveals prominent tongue fasciculations, hoarse and hypophonic vocalization, symmetrically weak smile, mild diffuse muscle atrophy of limbs, neck and trapezii, asymmetric limb weakness x 4 and asymmetric hyperreflexia. No sensory deficit noted.  2. Today the patient failed NIF testing with RT, being unable to inhale deeply without coughing and clearing her throat; the best result was an FVC=0.6L/s. 3. Swallow evaluation clinical Impression: Mild oropharyngeal dysphagia with sensorimotor deficits. . Decreased oropharyngeal motility results in mild pharyngeal residuals and laryngeal penetration/trace aspiration of liquids. Cued cough was weak and not effective to clear aspirates. 4. Exam findings with hyperreflexia in conjunction with muscle wasting, tongue fasciculations and asymmetric weakness best localize to the bulbar and spinal motor neurons as well as pyramidal neurons. DDx includes ALS or diffuse spinal and brainstem white matter disease.    Recommendations: 1. MRI of brain and cervical spine 2. Expedited outpatient Neurology follow up with [facility] to obtain EMG/NCS and repeat clinical exam by an ALS specialist.  3. Continue Speech/Swallow therapy. 4. Continue RT assessments 5. PT    Electronically signed: Dr. Kerney Elbe 05/27/2020, 11:16 PM

## 2020-05-27 NOTE — ED Notes (Signed)
Admitting provider at the bedside to evaluate the patient at this time.

## 2020-05-27 NOTE — Progress Notes (Signed)
Performed NIF and VC tests with pt x3 each.  Pt failed NIF, unable to inhale deeply without coughing / clearing her throat.  Best result of VC=0.6L/s.

## 2020-05-28 ENCOUNTER — Observation Stay (HOSPITAL_COMMUNITY): Payer: Medicare Other

## 2020-05-28 DIAGNOSIS — M4319 Spondylolisthesis, multiple sites in spine: Secondary | ICD-10-CM | POA: Diagnosis not present

## 2020-05-28 DIAGNOSIS — I712 Thoracic aortic aneurysm, without rupture: Secondary | ICD-10-CM | POA: Diagnosis present

## 2020-05-28 DIAGNOSIS — G1229 Other motor neuron disease: Secondary | ICD-10-CM | POA: Diagnosis not present

## 2020-05-28 DIAGNOSIS — R1312 Dysphagia, oropharyngeal phase: Secondary | ICD-10-CM | POA: Diagnosis present

## 2020-05-28 DIAGNOSIS — Z7984 Long term (current) use of oral hypoglycemic drugs: Secondary | ICD-10-CM | POA: Diagnosis not present

## 2020-05-28 DIAGNOSIS — G250 Essential tremor: Secondary | ICD-10-CM | POA: Diagnosis present

## 2020-05-28 DIAGNOSIS — Z681 Body mass index (BMI) 19 or less, adult: Secondary | ICD-10-CM | POA: Diagnosis not present

## 2020-05-28 DIAGNOSIS — R490 Dysphonia: Secondary | ICD-10-CM | POA: Diagnosis present

## 2020-05-28 DIAGNOSIS — Z20822 Contact with and (suspected) exposure to covid-19: Secondary | ICD-10-CM | POA: Diagnosis present

## 2020-05-28 DIAGNOSIS — I6389 Other cerebral infarction: Secondary | ICD-10-CM | POA: Diagnosis not present

## 2020-05-28 DIAGNOSIS — G529 Cranial nerve disorder, unspecified: Secondary | ICD-10-CM | POA: Diagnosis not present

## 2020-05-28 DIAGNOSIS — I2510 Atherosclerotic heart disease of native coronary artery without angina pectoris: Secondary | ICD-10-CM | POA: Diagnosis present

## 2020-05-28 DIAGNOSIS — E1165 Type 2 diabetes mellitus with hyperglycemia: Secondary | ICD-10-CM | POA: Diagnosis present

## 2020-05-28 DIAGNOSIS — M8580 Other specified disorders of bone density and structure, unspecified site: Secondary | ICD-10-CM | POA: Diagnosis present

## 2020-05-28 DIAGNOSIS — Z7722 Contact with and (suspected) exposure to environmental tobacco smoke (acute) (chronic): Secondary | ICD-10-CM | POA: Diagnosis present

## 2020-05-28 DIAGNOSIS — M47812 Spondylosis without myelopathy or radiculopathy, cervical region: Secondary | ICD-10-CM | POA: Diagnosis not present

## 2020-05-28 DIAGNOSIS — G1220 Motor neuron disease, unspecified: Secondary | ICD-10-CM | POA: Diagnosis present

## 2020-05-28 DIAGNOSIS — R918 Other nonspecific abnormal finding of lung field: Secondary | ICD-10-CM | POA: Diagnosis present

## 2020-05-28 DIAGNOSIS — I252 Old myocardial infarction: Secondary | ICD-10-CM | POA: Diagnosis not present

## 2020-05-28 DIAGNOSIS — I10 Essential (primary) hypertension: Secondary | ICD-10-CM | POA: Diagnosis present

## 2020-05-28 DIAGNOSIS — E785 Hyperlipidemia, unspecified: Secondary | ICD-10-CM | POA: Diagnosis present

## 2020-05-28 DIAGNOSIS — M5416 Radiculopathy, lumbar region: Secondary | ICD-10-CM | POA: Diagnosis present

## 2020-05-28 DIAGNOSIS — M4802 Spinal stenosis, cervical region: Secondary | ICD-10-CM | POA: Diagnosis not present

## 2020-05-28 DIAGNOSIS — R5381 Other malaise: Secondary | ICD-10-CM | POA: Diagnosis not present

## 2020-05-28 DIAGNOSIS — R627 Adult failure to thrive: Secondary | ICD-10-CM | POA: Diagnosis present

## 2020-05-28 DIAGNOSIS — E43 Unspecified severe protein-calorie malnutrition: Secondary | ICD-10-CM | POA: Diagnosis present

## 2020-05-28 DIAGNOSIS — F32A Depression, unspecified: Secondary | ICD-10-CM | POA: Diagnosis present

## 2020-05-28 DIAGNOSIS — I6782 Cerebral ischemia: Secondary | ICD-10-CM | POA: Diagnosis not present

## 2020-05-28 DIAGNOSIS — I700 Atherosclerosis of aorta: Secondary | ICD-10-CM | POA: Diagnosis present

## 2020-05-28 DIAGNOSIS — Z8669 Personal history of other diseases of the nervous system and sense organs: Secondary | ICD-10-CM | POA: Diagnosis not present

## 2020-05-28 DIAGNOSIS — R634 Abnormal weight loss: Secondary | ICD-10-CM | POA: Diagnosis not present

## 2020-05-28 DIAGNOSIS — Z7982 Long term (current) use of aspirin: Secondary | ICD-10-CM | POA: Diagnosis not present

## 2020-05-28 DIAGNOSIS — K219 Gastro-esophageal reflux disease without esophagitis: Secondary | ICD-10-CM | POA: Diagnosis present

## 2020-05-28 DIAGNOSIS — E1143 Type 2 diabetes mellitus with diabetic autonomic (poly)neuropathy: Secondary | ICD-10-CM | POA: Diagnosis present

## 2020-05-28 DIAGNOSIS — G629 Polyneuropathy, unspecified: Secondary | ICD-10-CM | POA: Diagnosis not present

## 2020-05-28 LAB — GLUCOSE, CAPILLARY
Glucose-Capillary: 118 mg/dL — ABNORMAL HIGH (ref 70–99)
Glucose-Capillary: 129 mg/dL — ABNORMAL HIGH (ref 70–99)
Glucose-Capillary: 180 mg/dL — ABNORMAL HIGH (ref 70–99)
Glucose-Capillary: 271 mg/dL — ABNORMAL HIGH (ref 70–99)

## 2020-05-28 MED ORDER — DULOXETINE HCL 30 MG PO CPEP
30.0000 mg | ORAL_CAPSULE | Freq: Two times a day (BID) | ORAL | Status: DC
  Administered 2020-05-28 – 2020-05-30 (×3): 30 mg via ORAL
  Filled 2020-05-28 (×3): qty 1

## 2020-05-28 MED ORDER — AMLODIPINE BESYLATE 5 MG PO TABS
2.5000 mg | ORAL_TABLET | Freq: Every day | ORAL | Status: DC
  Administered 2020-05-28 – 2020-05-30 (×3): 2.5 mg via ORAL
  Filled 2020-05-28 (×4): qty 1

## 2020-05-28 MED ORDER — FLUTICASONE PROPIONATE 50 MCG/ACT NA SUSP: 2.0000 | Freq: Every day | NASAL | Status: DC | PRN

## 2020-05-28 MED ORDER — PRAVASTATIN SODIUM 20 MG PO TABS
20.0000 mg | ORAL_TABLET | Freq: Every evening | ORAL | Status: DC
  Administered 2020-05-28 – 2020-05-29 (×2): 20 mg via ORAL
  Filled 2020-05-28 (×2): qty 1

## 2020-05-28 MED ORDER — POLYETHYLENE GLYCOL 3350 17 G PO PACK
17.0000 g | PACK | ORAL | Status: DC
  Administered 2020-05-29: 10:00:00 17 g via ORAL
  Filled 2020-05-28: qty 1

## 2020-05-28 NOTE — Progress Notes (Signed)
Chaplain engaged in initial visit with [name] Wright.  [name] Wright shared that she would like to complete the Advanced Directive when her son is present.  Chaplain brought paperwork to her and let her know to have nurse page her when she is ready and her son is present.  [name] Wright also shared that she is feeling good today, stating, "It feels good to feel human today."  Chaplain affirmed that statement and offered blessings over her humanity.  [name] Wright described God as being her "one and only."  Chaplain could assess her need to have a "good day" and her reliance on God to get her through.   Chaplain will follow-up.    05/28/20 1100  Clinical Encounter Type  Visited With Patient  Visit Type Initial

## 2020-05-28 NOTE — Progress Notes (Signed)
  Speech Language Pathology Treatment: Dysphagia  Patient Details Name: [name] Wright MRN: [id number] DOB: [birth year] Today's Date: 05/28/2020 Time: 1735-1800 SLP Time Calculation (min) (ACUTE ONLY): 25 min  Assessment / Plan / Recommendation Clinical Impression  Pt and son present upon SLP entrance to room. Intake reported to be good at breakfast but not at dinner  - pt states dinner came too early.   Pt benefited from moderate verbal cues to start intake with liquids when she was taking her po medications.  Also advised her to follow solids with liquids and reviewed importance of airway protection - maintaining strength of cough as much as able.   Advised consider maximizing liquid nutrition due to her report of increased difficulties with solids than liquids and for energy conservation.  Please consider a dietician consult to help pt receive adequate calories.  Pt admits her appetite is poor and states this is her primary reason for her weight loss.  Also ? if she could benefit from an appetite stimulant to improve her appetite.  No SlP follow up as all education completed with pt regarding swallowing compensations and recommendations.  Thanks for allowing me to help care for this pt.    HPI HPI: Pt is an 79 yo female with ED visit due to progressive dysphagia, weakness and significant weight loss over the last year.  Pt has hoarseness and dysarthria.  She also admits to progressive weakness overall.  PMH also + for essential tremor, MI, Schaztki's rin, Shingles, GERD, CAD s/p stenting.   CXR negative for acute findings.  Pt failed RN [facility] and thus SLP was ordered.  Pt hypernasal and dysarthric and reports that her speech has been difficult to understand over the last few months thus she does not speak on the phone.  She denies requiring heimlich manuever.  Pt admits to gustatory changes and progressive dysphagia to food more than liquids.  She also states she must be careful taking pills - pointing  to near vallecular region and pyriform to indicate area of sensation of residuals.      SLP Plan  All goals met       Recommendations  Diet recommendations: Dysphagia 3 (mechanical soft);Thin liquid Liquids provided via: Cup;Straw Medication Administration: Whole meds with puree Compensations: Slow rate;Small sips/bites;Follow solids with liquid (start intake with liquids) Postural Changes and/or Swallow Maneuvers: Seated upright 90 degrees;Upright 30-60 min after meal                Oral Care Recommendations: Oral care BID Follow up Recommendations: Other (comment) (SLP follow up at [facility] if definitive diagnosis for speech/swallowing compensations as disease progresses) SLP Visit Diagnosis: Dysphagia, oropharyngeal phase (R13.12) Plan: All goals met       GO                Macario Golds 05/28/2020, 6:35 PM  Kathleen Lime, MS [facility] SLP Acute Rehab Services Office [phone] Pager [phone]

## 2020-05-28 NOTE — Progress Notes (Signed)
Chaplain offered further support and clarification around advance directive

## 2020-05-28 NOTE — Progress Notes (Signed)
PROGRESS NOTE  [name] Wright OZD:[id number] DOB: [birth year] DOA: 05/26/2020 PCP: Yvonna Alanis, NP   LOS: 0 days   Brief narrative:  [name] Wright is a 79 y.o. female with history of diabetes mellitus type 2, hypertension, CAD status post stent presented to hospital with difficulty swallowing, hoarseness and weight loss with nasal intonation. Patient primary care patient had an ultrasound of the thyroid last month which was unremarkable.  Had followed up with her primary care physician today and was found to be tachycardic and was referred to the ER.  Patient states she is able to swallow but has to really put a lot of effort for the solids.    Patient reported significant weight loss recently and overall decline for a year or so. In the ER, patient was mildly tachycardic not hypoxic. ER physician did a CT chest and neck without contrast which were largely unremarkable except for some pulmonary nodules.  Labs are unremarkable except for hyperglycemia.  Bedside swallow was done which patient failed.  Given the weight loss, dysphagia and failed swallow patient was admitted to hospital for further observation and possible GI work-up.  Assessment/Plan:  Principal Problem:   Dysphagia Active Problems:   CAD- RCA DES 2006   Essential hypertension   Weight loss, unintentional  Dysphagia, hoarseness of voice, significant weight loss, nasal intonation, fasciculations progressive difficulty with mobility.  Possibility of motor neuron disease/neuromuscular disorder.    Seen by neurology.  Recommend MRI of the brain and cervical spine.  Patient might need the EMG and further work-up.  Speech and swallow has seen the patient and currently on  dysphagia 3 diet.  Follow neurology recommendations.  Essential hypertension.  As needed labetalol IV for now.    Resume amlodipine   Diabetes mellitus type 2.  Continue sliding scale insulin for now.  Hold oral hypoglycemic agents.  History of coronary artery  disease status post stent.  Resume statins as able  Pulmonary nodules.  Will need outpatient follow-up.  DVT prophylaxis: SCDs Start: 05/27/20 0308  Code Status: Full code  Family Communication: I spoke with the patient's son and updated him about the clinical condition of the patient. Spoke with goals of care and he is going to discuss with the family.  Status is: Observation  The patient will require care spanning > 2 midnights and should be moved to inpatient because: Unsafe d/c plan, IV treatments appropriate due to intensity of illness or inability to take PO, Inpatient level of care appropriate due to severity of illness and Physical therapy evaluation, neuro evaluation  Dispo: The patient is from: Home              Anticipated d/c is to: Undetermined              Anticipated d/c date is: 2 days              Patient currently is not medically stable to d/c.   Consultants:  Neurology  Procedures:  None  Antibiotics:  . None  Anti-infectives (From admission, onward)   None      Subjective: Today, patient was seen and examined at bedside.  Complains of little better with breathing.  Still has weakness, dysphagia hoarseness.  Objective: Vitals:   05/28/20 0150 05/28/20 0523  BP: 128/64 (!) 147/77  Pulse: 96 96  Resp: 14 14  Temp: (!) 97.4 F (36.3 C) 98.2 F (36.8 C)  SpO2: 97% 97%    Intake/Output Summary (Last 24 hours)  at 05/28/2020 1032 Last data filed at 05/27/2020 1138 Gross per 24 hour  Intake 369.66 ml  Output --  Net 369.66 ml   Filed Weights   05/26/20 1738  Weight: 38.1 kg   Body mass index is 15.36 kg/m.   Physical Exam: GENERAL: Patient is alert awake and oriented. Not in obvious distress.  Thinly built HENT: No scleral pallor or icterus. Pupils equally reactive to light. Oral mucosa is moist with nasal intonation.  Tongue fasciculations noted NECK: is supple, no gross swelling noted. CHEST: .  Diminished breath sounds  bilaterally. CVS: S1 and S2 heard, no murmur. Regular rate and rhythm.  ABDOMEN: Soft, non-tender, bowel sounds are present. EXTREMITIES: Moves all extremities CNS:, Tongue fasciculations, hoarse voice.  Generalized weakness noted SKIN: warm and dry without rashes.  Data Review: I have personally reviewed the following laboratory data and studies,  CBC: Recent Labs  Lab 05/26/20 1752  WBC 10.1  HGB 14.3  HCT 43.9  MCV 91.5  PLT 242   Basic Metabolic Panel: Recent Labs  Lab 05/26/20 1752  NA 136  K 4.0  CL 101  CO2 26  GLUCOSE 245*  BUN 15  CREATININE 0.40*  CALCIUM 9.0   Liver Function Tests: Recent Labs  Lab 05/27/20 0040  AST 14*  ALT 15  ALKPHOS 72  BILITOT 0.6  PROT 6.7  ALBUMIN 4.2   No results for input(s): LIPASE, AMYLASE in the last 168 hours. No results for input(s): AMMONIA in the last 168 hours. Cardiac Enzymes: No results for input(s): CKTOTAL, CKMB, CKMBINDEX, TROPONINI in the last 168 hours. BNP (last 3 results) No results for input(s): BNP in the last 8760 hours.  ProBNP (last 3 results) No results for input(s): PROBNP in the last 8760 hours.  CBG: Recent Labs  Lab 05/27/20 1539 05/27/20 2026 05/27/20 2350 05/28/20 0415 05/28/20 0802  GLUCAP 125* 174* 165* 118* 129*   Recent Results (from the past 240 hour(s))  Resp Panel by RT-PCR (Flu A&B, Covid) Nasopharyngeal Swab     Status: None   Collection Time: 05/27/20 12:43 AM   Specimen: Nasopharyngeal Swab; Nasopharyngeal(NP) swabs in vial transport medium  Result Value Ref Range Status   SARS Coronavirus 2 by RT PCR NEGATIVE NEGATIVE Final    Comment: (NOTE) SARS-CoV-2 target nucleic acids are NOT DETECTED.  The SARS-CoV-2 RNA is generally detectable in upper respiratory specimens during the acute phase of infection. The lowest concentration of SARS-CoV-2 viral copies this assay can detect is 138 copies/mL. A negative result does not preclude SARS-Cov-2 infection and should not  be used as the sole basis for treatment or other patient management decisions. A negative result may occur with  improper specimen collection/handling, submission of specimen other than nasopharyngeal swab, presence of viral mutation(s) within the areas targeted by this assay, and inadequate number of viral copies(<138 copies/mL). A negative result must be combined with clinical observations, patient history, and epidemiological information. The expected result is Negative.  Fact Sheet for Patients:  EntrepreneurPulse.com.au  Fact Sheet for Healthcare Providers:  IncredibleEmployment.be  This test is no t yet approved or cleared by the [place] FDA and  has been authorized for detection and/or diagnosis of SARS-CoV-2 by FDA under an Emergency Use Authorization (EUA). This EUA will remain  in effect (meaning this test can be used) for the duration of the COVID-19 declaration under Section 564(b)(1) of the Act, 21 U.S.C.section 360bbb-3(b)(1), unless the authorization is terminated  or revoked sooner.  Influenza A by PCR NEGATIVE NEGATIVE Final   Influenza B by PCR NEGATIVE NEGATIVE Final    Comment: (NOTE) The Xpert Xpress SARS-CoV-2/FLU/RSV plus assay is intended as an aid in the diagnosis of influenza from Nasopharyngeal swab specimens and should not be used as a sole basis for treatment. Nasal washings and aspirates are unacceptable for Xpert Xpress SARS-CoV-2/FLU/RSV testing.  Fact Sheet for Patients: EntrepreneurPulse.com.au  Fact Sheet for Healthcare Providers: IncredibleEmployment.be  This test is not yet approved or cleared by the [place] FDA and has been authorized for detection and/or diagnosis of SARS-CoV-2 by FDA under an Emergency Use Authorization (EUA). This EUA will remain in effect (meaning this test can be used) for the duration of the COVID-19 declaration under Section  564(b)(1) of the Act, 21 U.S.C. section 360bbb-3(b)(1), unless the authorization is terminated or revoked.  Performed at [facility], [place] [address], [place], [place] [address]      Studies: DG Chest 2 View  Result Date: 05/26/2020 CLINICAL DATA:  Tachycardia. EXAM: CHEST - 2 VIEW COMPARISON:  Chest x-ray dated February 19, 2017. FINDINGS: The heart size and mediastinal contours are within normal limits. Both lungs are clear. The visualized skeletal structures are unremarkable. IMPRESSION: No active cardiopulmonary disease. Electronically Signed   By: Titus Dubin M.D.   On: 05/26/2020 19:17   CT Soft Tissue Neck Wo Contrast  Result Date: 05/27/2020 CLINICAL DATA:  Hoarseness. EXAM: CT NECK WITHOUT CONTRAST TECHNIQUE: Multidetector CT imaging of the neck was performed following the standard protocol without intravenous contrast. COMPARISON:  None. FINDINGS: Pharynx and larynx: Mildly motion degraded study, particularly at the supraglottic level. Contour abnormality of the posterior right hypopharynx is suspected to be a retropharyngeal internal carotid artery, but difficult to be sure without IV contrast. No other pharyngeal or laryngeal abnormality. Salivary glands: No inflammation, mass, or stone. Thyroid: Normal. Lymph nodes: None enlarged or abnormal density. Vascular: Aberrant right subclavian artery. Limited intracranial: Negative. Visualized orbits: Negative. Mastoids and visualized paranasal sinuses: Clear. Skeleton: No acute or aggressive process. Upper chest: Negative. Other: None. IMPRESSION: 1. Mildly motion degraded study, particularly at the level of the supraglottic larynx. 2. Contour abnormality of the posterior right hypopharynx is suspected to be a retropharyngeal internal carotid artery, but difficult to be sure without IV contrast. Otherwise normal appearance of the pharynx and larynx. 3. Aberrant right subclavian artery. Electronically Signed   By: Ulyses Jarred M.D.   On: 05/27/2020 01:28   CT Chest Wo Contrast  Result Date: 05/27/2020 CLINICAL DATA:  Unintentional weight loss, intermittent dyspnea EXAM: CT CHEST WITHOUT CONTRAST TECHNIQUE: Multidetector CT imaging of the chest was performed following the standard protocol without IV contrast. COMPARISON:  None. FINDINGS: Cardiovascular: Extensive multi-vessel coronary artery calcification. Global cardiac size within normal limits. No pericardial effusion. Central pulmonary arteries are of normal caliber. Variant arch anatomy is identified with aberrant origin of the right subclavian artery and common origin of the common carotid arteries. Mild dilation of the aortic arch is identified measuring 3.1 cm in greatest dimension beyond the takeoff of the left subclavian artery. The ascending aorta and more distal descending aorta are of normal caliber. Moderate atherosclerotic calcification within the thoracic aorta. Mediastinum/Nodes: No enlarged mediastinal or axillary lymph nodes. Thyroid gland, trachea, and esophagus demonstrate no significant findings. Lungs/Pleura: 6 mm ground-glass pulmonary nodule, right upper lobe, image # 49. 4 mm noncalcified pulmonary nodule, right lower lobe, image # 70. No other focal pulmonary nodules or infiltrates. No pneumothorax  or pleural effusion. Central airways are widely patent. Upper Abdomen: Tiny cyst within the visualized left hepatic lobe. No acute abnormality. Musculoskeletal: Osseous structures appear osteopenic. No acute bone abnormality. No focal lytic or blastic bone lesions. IMPRESSION: No definite radiographic explanation for the patient's reported weight loss. Multiple pulmonary nodules. Initial follow-up with CT at 6-12 months is recommended to confirm persistence. If persistent, repeat CT is recommended every 2 years until 5 years of stability has been established. This recommendation follows the consensus statement: Guidelines for Management of Incidental  Pulmonary Nodules Detected on CT Images: From the Fleischner Society 2017; Radiology 2017; 284:228-243. 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 Recommend annual imaging followup by CTA or MRA. This recommendation follows 2010 ACCF/AHA/AATS/ACR/ASA/SCA/SCAI/SIR/STS/SVM Guidelines for the Diagnosis and Management of Patients with Thoracic Aortic Disease. Circulation.2010; 121: F026-V785. Aortic aneurysm NOS (ICD10-I71.9) Aortic Atherosclerosis (ICD10-I70.0). Electronically Signed   By: Fidela Salisbury MD   On: 05/27/2020 01:32   MR BRAIN WO CONTRAST  Result Date: 05/28/2020 CLINICAL DATA:  Cranial neuropathy (CN 9). EXAM: MRI HEAD WITHOUT CONTRAST TECHNIQUE: Multiplanar, multiecho pulse sequences of the brain and surrounding structures were obtained without intravenous contrast. COMPARISON:  Head CT June 27, 2006 FINDINGS: Brain: No acute infarction, hemorrhage, hydrocephalus, extra-axial collection or mass lesion. 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 Scattered foci of T2 hyperintensity are seen within the white matter of the cerebral hemispheres and within the pons, nonspecific, most likely related to chronic small vessel ischemia. Vascular: Normal flow voids. Skull and upper cervical spine: Right frontal osteoma. Marrow signal characteristics are otherwise maintained. Sinuses/Orbits: Bilateral lens surgery. Paranasal sinuses are clear. IMPRESSION: 1. No acute intracranial abnormality. 2. 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 3. Mild chronic small vessel ischemia. Electronically Signed   By: Pedro Earls M.D.   On: 05/28/2020 09:16   MR CERVICAL SPINE WO CONTRAST  Result Date: 05/28/2020 CLINICAL DATA:  Myelopathy, acute or progressive. EXAM: MRI CERVICAL SPINE WITHOUT CONTRAST TECHNIQUE: Multiplanar, multisequence MR imaging of the cervical spine was performed. No intravenous contrast was administered.  COMPARISON:  None. FINDINGS: Alignment: Minimal anterolisthesis at C7-T1 and T1-2. Minimal retrolisthesis at C4-5 Vertebrae: No fracture, evidence of discitis, or bone lesion. Cord: Normal signal and morphology. Posterior Fossa, vertebral arteries, paraspinal tissues: Negative. Disc levels: C2-3: Mild facet degenerative changes. No spinal canal or neural foraminal stenosis. C3-4:Small posterior disc protrusion without significant spinal canal stenosis. Uncovertebral and facet degenerative changes resulting in moderate right neural foraminal narrowing. C4-5: Posterior disc osteophyte complex and prominence of ligamentum flavum resulting in mild spinal canal stenosis. Uncovertebral and facet degenerative changes resulting in severe bilateral neural foraminal narrowing. C5-6:Posterior disc protrusion without significant spinal canal stenosis. Uncovertebral and facet degenerative changes resulting in moderate right and severe left neural foraminal narrowing. C6-7: Posterior disc protrusion causing indentation on the anterior CSF space without significant spinal canal stenosis. Uncovertebral and facet degenerative changes resulting in mild bilateral neural foraminal narrowing. C7-T1: No spinal canal or neural foraminal stenosis. IMPRESSION: 1. Multilevel degenerative changes of the cervical spine as described above, with mild spinal canal stenosis at C4-5. 2. Multilevel neural foraminal narrowing, severe bilaterally at C4-5 and on the left at C5-6. Electronically Signed   By: Pedro Earls M.D.   On: 05/28/2020 09:24   DG Swallowing Func-Speech Pathology  Result Date: 05/27/2020 Objective Swallowing Evaluation: Type of Study: MBS-Modified Barium Swallow Study  Patient  Details Name: [name] Wright MRN: [id number] Date of Birth: Dec 23, 1940 Today's Date: 05/27/2020 Time: SLP Start Time (ACUTE ONLY): 1235 -SLP Stop Time (ACUTE ONLY): 1255 SLP Time Calculation (min) (ACUTE ONLY): 20 min Past Medical  History: Past Medical History: Diagnosis Date . Aortic atherosclerosis ([place])  . BCC (basal cell carcinoma of skin) 06/24/1993  Left Shoulder . 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 Cancer ([place])   skin and eye . Constipation  . Coronary artery disease   NUCLEAR STRESS TEST, 09/30/2009 - EKG negative for ischemia . Depression  . Diabetes mellitus without complication ([place])   Treated by diet . Essential tremor   hands, sometimes legs  . Family history of coronary artery disease   Mother died 56 . GERD (gastroesophageal reflux disease)  . H/O subconjunctival hemorrhage  . Heart attack ([place])  . History of blood transfusion  . History of failure to thrive syndrome  . History of weakness  . Hyperlipidemia  . Hypertension  . Keratoacanthoma 01/05/2011  Left Forearm . Lumbar back pain with radiculopathy affecting lower extremity  . Myocardial infarction ([place]) 2006 . Neck pain  . 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 Osteopenia  . Presence of pessary  . Retroperitoneal hematoma   S/P . Schatzki's ring  . Shingles  . 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 Superficial nodular basal cell carcinoma (BCC) 03/02/2016  Left Shoulder . Trapezius muscle spasm   right side . Uterovaginal prolapse  . Weight loss  Past Surgical History: Past Surgical History: Procedure Laterality Date . CATARACT EXTRACTION, BILATERAL   . COLONOSCOPY   . CORONARY ANGIOPLASTY WITH STENT PLACEMENT  11/05/2004  RCA DES . DILATION AND CURETTAGE OF UTERUS   . LID LESION EXCISION Left 02/14/2018  Procedure: LEFT LID LESION EXCISION;  Surgeon: Clista Bernhardt, MD;  Location: [facility];  Service: Ophthalmology;  Laterality: Left; . RECONSTRUCTION OF EYELID Left 02/14/2018  Procedure: TOTAL RECONSTRUCTION OF EYELID;  Surgeon: Clista Bernhardt, MD;  Location: [place];  Service: Ophthalmology;  Laterality: Left; . SKIN FULL THICKNESS GRAFT Left 02/14/2018  Procedure: SKIN GRAFT FULL THICKNESS LEFT EYE;   Surgeon: Clista Bernhardt, MD;  Location: [place];  Service: Ophthalmology;  Laterality: Left; . TONSILECTOMY/ADENOIDECTOMY WITH MYRINGOTOMY   . TUBAL LIGATION   HPI: Pt is an 79 yo female with ED visit due to progressive dysphagia, weakness and significant weight loss over the last year.  Pt has hoarseness and dysarthria.  She also admits to progressive weakness overall.  PMH also + for essential tremor, MI, Schaztki's rin, Shingles, GERD, CAD s/p stenting.   CXR negative for acute findings.  Pt failed RN [place] and thus SLP was ordered.  Pt hypernasal and dysarthric and reports that her speech has been difficult to understand over the last few months thus she does not speak on the phone.  She denies requiring heimlich manuever.  Pt admits to gustatory changes and progressive dysphagia to food more than liquids.  She also states she must be careful taking pills - pointing to near vallecular region and pyriform to indicate area of sensation of residuals.  Subjective: pt awake on flouro chair with son present Assessment / Plan / Recommendation CHL IP CLINICAL IMPRESSIONS 05/27/2020 Clinical Impression Mild oropharyngeal dysphagia with sensorimotor deficits. . Decreased oropharyngeal motility results in mild pharyngeal residuals and laryngeal penetration/trace aspiration of liquids. Cued cough was weak and not effective to clear aspirates.  Dry  swallows helpful to decrease pharyngeal retention. Postures of head turn right/left did not aid pharyngeal clearance (turn to the right worsened clearance significantly) and chin tuck not tested due to pt's discomfort with position.  Palatal deviation to right upon phonation noted thus concern for pharyngeal weakness on left.  Pt only tested in sagittal view due to physical limitations.  Pt takes very small bites/sips to compensate (1/8 tsp during testing) and this has certainly contributed to her weight loss.  She declined to swallow a barium tablet when given with options of  taking with puree or thin. Question source of dysphagia - ? Neuro, ? Indication for imaging to help elucidate source.  Recommend Dys3/thin diet with precautions.    Consider medications crushed if large and give with applesauce.  (start and follow with liquids)  Advise pt drink water with meals due to her trace aspiration.    Multiple swallows per bite/sip and follow solid with liquid.    Maximize liquid nutrition - pt likes chocolate Boost.    Stop intake if pt coughing with intake. SLP Visit Diagnosis Dysphagia, oropharyngeal phase (R13.12) Attention and concentration deficit following -- Frontal lobe and executive function deficit following -- Impact on safety and function Mild aspiration risk   CHL IP TREATMENT RECOMMENDATION 05/27/2020 Treatment Recommendations Therapy as outlined in treatment plan below   Prognosis 05/27/2020 Prognosis for Safe Diet Advancement Fair Barriers to Reach Goals Time post onset;Other (Comment) Barriers/Prognosis Comment -- CHL IP DIET RECOMMENDATION 05/27/2020 SLP Diet Recommendations -- Liquid Administration via Cup Medication Administration Crushed with puree Compensations Slow rate;Small sips/bites;Follow solids with liquid Postural Changes Remain semi-upright after after feeds/meals (Comment);Seated upright at 90 degrees   CHL IP OTHER RECOMMENDATIONS 05/27/2020 Recommended Consults -- Oral Care Recommendations Oral care BID Other Recommendations --   CHL IP FOLLOW UP RECOMMENDATIONS 05/27/2020 Follow up Recommendations Other (comment)   CHL IP FREQUENCY AND DURATION 05/27/2020 Speech Therapy Frequency (ACUTE ONLY) min 1 x/week Treatment Duration 1 week      CHL IP ORAL PHASE 05/27/2020 Oral Phase Impaired Oral - Pudding Teaspoon -- Oral - Pudding Cup -- Oral - Honey Teaspoon -- Oral - Honey Cup -- Oral - Nectar Teaspoon -- Oral - Nectar Cup WFL Oral - Nectar Straw -- Oral - Thin Teaspoon -- Oral - Thin Cup WFL Oral - Thin Straw WFL Oral - Puree Delayed oral transit;Piecemeal  swallowing Oral - Mech Soft Delayed oral transit;Reduced posterior propulsion;Impaired mastication;Weak lingual manipulation Oral - Regular -- Oral - Multi-Consistency -- Oral - Pill -- Oral Phase - Comment --  CHL IP PHARYNGEAL PHASE 05/27/2020 Pharyngeal Phase Impaired Pharyngeal- Pudding Teaspoon -- Pharyngeal -- Pharyngeal- Pudding Cup -- Pharyngeal -- Pharyngeal- Honey Teaspoon -- Pharyngeal -- Pharyngeal- Honey Cup -- Pharyngeal -- Pharyngeal- Nectar Teaspoon -- Pharyngeal -- Pharyngeal- Nectar Cup Penetration/Aspiration during swallow;Reduced epiglottic inversion;Reduced airway/laryngeal closure;Reduced tongue base retraction Pharyngeal Material enters airway, remains ABOVE vocal cords and not ejected out Pharyngeal- Nectar Straw -- Pharyngeal -- Pharyngeal- Thin Teaspoon -- Pharyngeal -- Pharyngeal- Thin Cup Reduced epiglottic inversion;Reduced laryngeal elevation;Reduced airway/laryngeal closure;Reduced tongue base retraction;Penetration/Aspiration during swallow;Penetration/Apiration after swallow;Trace aspiration Pharyngeal Material enters airway, CONTACTS cords and not ejected out Pharyngeal- Thin Straw Reduced airway/laryngeal closure;Reduced laryngeal elevation;Penetration/Aspiration during swallow;Penetration/Apiration after swallow Pharyngeal Material enters airway, CONTACTS cords and not ejected out Pharyngeal- Puree Reduced epiglottic inversion;Pharyngeal residue - valleculae Pharyngeal Material does not enter airway Pharyngeal- Mechanical Soft Reduced epiglottic inversion;Pharyngeal residue - valleculae Pharyngeal Material does not enter airway Pharyngeal- Regular -- Pharyngeal -- Pharyngeal-  Multi-consistency -- Pharyngeal -- Pharyngeal- Pill -- Pharyngeal -- Pharyngeal Comment --  CHL IP CERVICAL ESOPHAGEAL PHASE 05/27/2020 Cervical Esophageal Phase WFL Pudding Teaspoon -- Pudding Cup -- Honey Teaspoon -- Honey Cup -- Nectar Teaspoon -- Nectar Cup -- Nectar Straw -- Thin Teaspoon -- Thin Cup --  Thin Straw -- Puree -- Mechanical Soft -- Regular -- Multi-consistency -- Pill -- Cervical Esophageal Comment -- Kathleen Lime, MS [facility] SLP Acute Rehab Services Office [phone] Pager [phone] Macario Golds 05/27/2020, 2:32 PM                 Flora Lipps, MD  [facility] 05/28/2020  If 7PM-7AM, please contact night-coverage

## 2020-05-28 NOTE — Progress Notes (Signed)
Pt gave fair effort.  Best of three attempts each:  NIF -14 VC 0.9L

## 2020-05-28 NOTE — Progress Notes (Signed)
Pt had good effort. NIF-15 VC 1.6L

## 2020-05-29 DIAGNOSIS — E43 Unspecified severe protein-calorie malnutrition: Secondary | ICD-10-CM | POA: Diagnosis present

## 2020-05-29 DIAGNOSIS — G1229 Other motor neuron disease: Secondary | ICD-10-CM

## 2020-05-29 DIAGNOSIS — R5381 Other malaise: Secondary | ICD-10-CM

## 2020-05-29 LAB — GLUCOSE, CAPILLARY
Glucose-Capillary: 69 mg/dL — ABNORMAL LOW (ref 70–99)
Glucose-Capillary: 169 mg/dL — ABNORMAL HIGH (ref 70–99)
Glucose-Capillary: 141 mg/dL — ABNORMAL HIGH (ref 70–99)
Glucose-Capillary: 159 mg/dL — ABNORMAL HIGH (ref 70–99)
Glucose-Capillary: 119 mg/dL — ABNORMAL HIGH (ref 70–99)
Glucose-Capillary: 151 mg/dL — ABNORMAL HIGH (ref 70–99)
Glucose-Capillary: 191 mg/dL — ABNORMAL HIGH (ref 70–99)

## 2020-05-29 MED ORDER — BENEPROTEIN PO POWD: 1.0000 | Freq: Three times a day (TID) | ORAL | Status: DC

## 2020-05-29 MED ORDER — RESOURCE INSTANT PROTEIN PO PWD PACKET
1.0000 | Freq: Three times a day (TID) | ORAL | Status: DC
  Administered 2020-05-29 – 2020-05-30 (×2): 6 g via ORAL
  Filled 2020-05-29 (×4): qty 6

## 2020-05-29 MED ORDER — INSULIN ASPART 100 UNIT/ML ~~LOC~~ SOLN
0.0000 [IU] | Freq: Three times a day (TID) | SUBCUTANEOUS | Status: DC
  Administered 2020-05-30: 13:00:00 1 [IU] via SUBCUTANEOUS

## 2020-05-29 MED ORDER — BOOST PLUS PO LIQD
237.0000 mL | Freq: Three times a day (TID) | ORAL | Status: DC
  Administered 2020-05-29 – 2020-05-30 (×2): 237 mL via ORAL
  Filled 2020-05-29 (×4): qty 237

## 2020-05-29 NOTE — Progress Notes (Signed)
RT NOTE:  NIF -15 (best of 3) VC 1L (best of 3)  Pt had good effort.

## 2020-05-29 NOTE — Discharge Instructions (Signed)
Nutrition therapy can help you eat more calories and gain weight. As you gain weight, your health may improve.  Tips Meal Planning Tips   Eat at least five small meals and snacks each day.  Drink healthy beverages that add calories. For example, have juice, milk, or shakes.  Drink nutritional supplements.  Try high-calorie, high-protein recipes.  Sweeten foods and beverages with sugar, jam, jelly, or honey.  Choose higher-calorie starchy vegetables such as potatoes, corn, and peas. Add cream, butter, margarine, cheese sauce, olive oil, or salad dressing to get more calories.  Eat fruit canned in heavy syrup.  Choose foods high in protein. These include milk, eggs, cheese, meat, fish, poultry, and beans. You may also use protein powders and meal replacement shakes and bars.  Add high-fat foods to meals and snacks:  Choices include butter, regular margarine, vegetable oils, peanut butter, and mayonnaise.  Whole milk, half-and-half, and cream have more calories than skim or low-fat milk.  Higher-fat meats and whole-milk cheeses provide more calories than lean or low-fat types.  From Academy of Nutrition and Dietetics

## 2020-05-29 NOTE — Progress Notes (Signed)
NIF -10  Best of 3

## 2020-05-29 NOTE — Progress Notes (Signed)
Inpatient Diabetes Program Recommendations  AACE/ADA: New Consensus Statement on Inpatient Glycemic Control (2015)  Target Ranges:  Prepandial:   less than 140 mg/dL      Peak postprandial:   less than 180 mg/dL (1-2 hours)      Critically ill patients:  140 - 180 mg/dL   Lab Results  Component Value Date   GLUCAP 159 (H) 05/29/2020   HGBA1C 7.7 (H) 04/21/2020    Review of Glycemic Control Results for LATISA, BELAY (MRN [id number]) as of 05/29/2020 10:49  Ref. Range 05/28/2020 20:20 05/28/2020 21:50 05/29/2020 05:09 05/29/2020 07:39  Glucose-Capillary Latest Ref Range: 70 - 99 mg/dL 69 (L) 169 (H) 141 (H) 159 (H)   Diabetes history: Type 2 Dm Outpatient Diabetes medications: Metformin 1000 mg BID Current orders for Inpatient glycemic control: Novolog 0-9 units Q4H  Inpatient Diabetes Program Recommendations:    Consider decreasing correction to Novolog 0-6 units Q4H.   Thanks, Bronson Curb, MSN, RNC-OB Diabetes Coordinator [phone] (8a-5p)

## 2020-05-29 NOTE — Progress Notes (Addendum)
Neurology Progress Note  Major interval events:  - MRI brain and c-spine completed    Subjective: - Comfortable, eager to work with PT - Denies any feeling of her muscles jumping or twitching other than some left hand cramping intermittently  Exam: Vitals:   05/28/20 2019 05/29/20 0511  BP: 138/74 139/79  Pulse: 93 92  Resp: 14 14  Temp: 97.9 F (36.6 C) 98 F (36.7 C)  SpO2: 94% 94%   Gen: In bed, comfortable  Resp: non-labored breathing, no grossly audible wheezing Cardiac: Perfusing extremities well  Abd: soft, nt  Neuro: MS: Awake, oriented to person, place, situation CN: Face symmetric, tongue midline with prominent fasciculations, pupils round and equal, EOMI grossly intact Motor: Moves all extremities freely antigravity.  Muscle wasting of the intrinsic muscles of the hands.  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  We will additionally order the basic initial screening blood work which can also be followed up on an outpatient basis  Recommendations:  -TSH, CK, ESR, ANA, rheumatoid factor, vitamin B12, anti-GM 1 antibody, SPEP, IFE -Stable for outpatient follow-up as discussed by Dr. Cheral Marker -Additionally recommend expedited follow-up for the pulmonary nodules given that malignancy can result in infiltration of moderate/cranial nerves  Lesleigh Noe MD-PhD [facility] [phone]

## 2020-05-29 NOTE — Progress Notes (Signed)
PROGRESS NOTE  [name] Wright ZJI:[id number] DOB: 12-16-1940 DOA: 05/26/2020 PCP: [name] Alanis, NP   LOS: 1 day   Brief narrative:  [name] Wright is a 79 y.o. female with history of diabetes mellitus type 2, hypertension, CAD status post stent presented to hospital with difficulty swallowing, hoarseness and weight loss with nasal intonation. Patient primary care patient had an ultrasound of the thyroid last month which was unremarkable.  Had followed up with her primary care physician today and was found to be tachycardic and was referred to the ER.  Patient states she is able to swallow but has to really put a lot of effort for the solids.    Patient reported significant weight loss recently and overall decline for a year or so. In the ER, patient was mildly tachycardic not hypoxic. ER physician did a CT chest and neck without contrast which were largely unremarkable except for some pulmonary nodules.  Labs are unremarkable except for hyperglycemia.  Bedside swallow was done which patient failed.  Given the weight loss, dysphagia and failed swallow patient was admitted to hospital for further observation and workup  Assessment/Plan:  Principal Problem:   Dysphagia Active Problems:   CAD- RCA DES 2006   Essential hypertension   Weight loss, unintentional  Dysphagia, hoarseness of voice, significant weight loss, nasal intonation, tongue fasciculations progressive difficulty with mobility.  Possibility of motor neuron disease.    Seen by neurology.   MRI of the brain and cervical spine was obtained.  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  MRI cervical spine shows degenerative changes.  Speech and swallow has seen the patient and currently on  dysphagia 3 diet.  Communicated with neurology team for further input.  Patient's family at the bedside was updated and they would like to hear from neurology point of view.  Get PT evaluation which is pending at this  time.  Essential hypertension.    Continue amlodipine, as needed labetalol.  Diabetes mellitus type 2.  Continue sliding scale insulin for now.  Hold oral hypoglycemic agents for now.  Will decrease sliding scale to sensitive scale.  History of coronary artery disease status post stent.  Pravastatin has been resumed  Pulmonary nodules.  Will need outpatient follow-up with the CT scan.  DVT prophylaxis: SCDs Start: 05/27/20 0308  Code Status: Full code.  Spoke about goals of care briefly yesterday but currently full code.  Family Communication: I again spoke with the patient's son and updated him about the clinical condition of the patient.  Status is: Inpatient  The patient is inpatient because:  Inpatient level of care appropriate due to severity of illness and pending physical therapy evaluation, neuro input  Dispo: The patient is from: Home              Anticipated d/c is to: Likely home with home health/rehabilitation              Anticipated d/c date is: 1 to 2 days              Patient currently is not medically stable to d/c.   Consultants:  Neurology  Procedures:  None  Antibiotics:  . None  Anti-infectives (From admission, onward)   None      Subjective: Today, patient was seen and examined at bedside.  Feels a little better with breathing.  Has been trying to eat a dysphagia 3 diet.  Denies dizziness or tenderness.  Objective: Vitals:   05/28/20 2019 05/29/20 0511  BP: 138/74 139/79  Pulse: 93 92  Resp: 14 14  Temp: 97.9 F (36.6 C) 98 F (36.7 C)  SpO2: 94% 94%    Intake/Output Summary (Last 24 hours) at 05/29/2020 1327 Last data filed at 05/28/2020 2028 Gross per 24 hour  Intake 600 ml  Output --  Net 600 ml   Filed Weights   05/26/20 1738  Weight: 38.1 kg   Body mass index is 15.36 kg/m.   Physical Exam:  GENERAL: Patient is alert awake and oriented. Not in obvious distress.  Thinly built HENT: No scleral pallor or icterus.  Pupils equally reactive to light. Oral mucosa is moist with nasal intonation.  Tongue fasciculations noted NECK: is supple, no gross swelling noted. CHEST: .  Diminished breath sounds bilaterally.  Coarse breath sounds noted. CVS: S1 and S2 heard, no murmur. Regular rate and rhythm.  ABDOMEN: Soft, non-tender, bowel sounds are present. EXTREMITIES: Moves all extremities CNS:, Tongue fasciculations, hoarse voice.  Generalized weakness noted SKIN: warm and dry without rashes.  Data Review: I have personally reviewed the following laboratory data and studies,  CBC: Recent Labs  Lab 05/26/20 1752  WBC 10.1  HGB 14.3  HCT 43.9  MCV 91.5  PLT 476   Basic Metabolic Panel: Recent Labs  Lab 05/26/20 1752  NA 136  K 4.0  CL 101  CO2 26  GLUCOSE 245*  BUN 15  CREATININE 0.40*  CALCIUM 9.0   Liver Function Tests: Recent Labs  Lab 05/27/20 0040  AST 14*  ALT 15  ALKPHOS 72  BILITOT 0.6  PROT 6.7  ALBUMIN 4.2   No results for input(s): LIPASE, AMYLASE in the last 168 hours. No results for input(s): AMMONIA in the last 168 hours. Cardiac Enzymes: No results for input(s): CKTOTAL, CKMB, CKMBINDEX, TROPONINI in the last 168 hours. BNP (last 3 results) No results for input(s): BNP in the last 8760 hours.  ProBNP (last 3 results) No results for input(s): PROBNP in the last 8760 hours.  CBG: Recent Labs  Lab 05/28/20 2020 05/28/20 2150 05/29/20 0509 05/29/20 0739 05/29/20 1220  GLUCAP 69* 169* 141* 159* 119*   Recent Results (from the past 240 hour(s))  Resp Panel by RT-PCR (Flu A&B, Covid) Nasopharyngeal Swab     Status: None   Collection Time: 05/27/20 12:43 AM   Specimen: Nasopharyngeal Swab; Nasopharyngeal(NP) swabs in vial transport medium  Result Value Ref Range Status   SARS Coronavirus 2 by RT PCR NEGATIVE NEGATIVE Final    Comment: (NOTE) SARS-CoV-2 target nucleic acids are NOT DETECTED.  The SARS-CoV-2 RNA is generally detectable in upper  respiratory specimens during the acute phase of infection. The lowest concentration of SARS-CoV-2 viral copies this assay can detect is 138 copies/mL. A negative result does not preclude SARS-Cov-2 infection and should not be used as the sole basis for treatment or other patient management decisions. A negative result may occur with  improper specimen collection/handling, submission of specimen other than nasopharyngeal swab, presence of viral mutation(s) within the areas targeted by this assay, and inadequate number of viral copies(<138 copies/mL). A negative result must be combined with clinical observations, patient history, and epidemiological information. The expected result is Negative.  Fact Sheet for Patients:  EntrepreneurPulse.com.au  Fact Sheet for Healthcare Providers:  IncredibleEmployment.be  This test is no t yet approved or cleared by the [place] FDA and  has been authorized for detection and/or diagnosis of SARS-CoV-2 by FDA  under an Emergency Use Authorization (EUA). This EUA will remain  in effect (meaning this test can be used) for the duration of the COVID-19 declaration under Section 564(b)(1) of the Act, 21 U.S.C.section 360bbb-3(b)(1), unless the authorization is terminated  or revoked sooner.       Influenza A by PCR NEGATIVE NEGATIVE Final   Influenza B by PCR NEGATIVE NEGATIVE Final    Comment: (NOTE) The Xpert Xpress SARS-CoV-2/FLU/RSV plus assay is intended as an aid in the diagnosis of influenza from Nasopharyngeal swab specimens and should not be used as a sole basis for treatment. Nasal washings and aspirates are unacceptable for Xpert Xpress SARS-CoV-2/FLU/RSV testing.  Fact Sheet for Patients: EntrepreneurPulse.com.au  Fact Sheet for Healthcare Providers: IncredibleEmployment.be  This test is not yet approved or cleared by the [place] FDA and has been  authorized for detection and/or diagnosis of SARS-CoV-2 by FDA under an Emergency Use Authorization (EUA). This EUA will remain in effect (meaning this test can be used) for the duration of the COVID-19 declaration under Section 564(b)(1) of the Act, 21 U.S.C. section 360bbb-3(b)(1), unless the authorization is terminated or revoked.  Performed at [facility], [place] [address], [place], [place] [address]      Studies: MR BRAIN WO CONTRAST  Result Date: 05/28/2020 CLINICAL DATA:  Cranial neuropathy (CN 9). EXAM: MRI HEAD WITHOUT CONTRAST TECHNIQUE: Multiplanar, multiecho pulse sequences of the brain and surrounding structures were obtained without intravenous contrast. COMPARISON:  Head CT June 27, 2006 FINDINGS: Brain: No acute infarction, hemorrhage, hydrocephalus, extra-axial collection or mass lesion. 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 Scattered foci of T2 hyperintensity are seen within the white matter of the cerebral hemispheres and within the pons, nonspecific, most likely related to chronic small vessel ischemia. Vascular: Normal flow voids. Skull and upper cervical spine: Right frontal osteoma. Marrow signal characteristics are otherwise maintained. Sinuses/Orbits: Bilateral lens surgery. Paranasal sinuses are clear. IMPRESSION: 1. No acute intracranial abnormality. 2. 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 3. Mild chronic small vessel ischemia. Electronically Signed   By: Pedro Earls M.D.   On: 05/28/2020 09:16   MR CERVICAL SPINE WO CONTRAST  Result Date: 05/28/2020 CLINICAL DATA:  Myelopathy, acute or progressive. EXAM: MRI CERVICAL SPINE WITHOUT CONTRAST TECHNIQUE: Multiplanar, multisequence MR imaging of the cervical spine was performed. No intravenous contrast was administered. COMPARISON:  None. FINDINGS: Alignment: Minimal anterolisthesis at C7-T1 and T1-2. Minimal retrolisthesis at C4-5 Vertebrae:  No fracture, evidence of discitis, or bone lesion. Cord: Normal signal and morphology. Posterior Fossa, vertebral arteries, paraspinal tissues: Negative. Disc levels: C2-3: Mild facet degenerative changes. No spinal canal or neural foraminal stenosis. C3-4:Small posterior disc protrusion without significant spinal canal stenosis. Uncovertebral and facet degenerative changes resulting in moderate right neural foraminal narrowing. C4-5: Posterior disc osteophyte complex and prominence of ligamentum flavum resulting in mild spinal canal stenosis. Uncovertebral and facet degenerative changes resulting in severe bilateral neural foraminal narrowing. C5-6:Posterior disc protrusion without significant spinal canal stenosis. Uncovertebral and facet degenerative changes resulting in moderate right and severe left neural foraminal narrowing. C6-7: Posterior disc protrusion causing indentation on the anterior CSF space without significant spinal canal stenosis. Uncovertebral and facet degenerative changes resulting in mild bilateral neural foraminal narrowing. C7-T1: No spinal canal or neural foraminal stenosis. IMPRESSION: 1. Multilevel degenerative changes of the cervical spine as described above, with mild spinal canal stenosis at C4-5. 2. Multilevel neural foraminal narrowing, severe bilaterally at C4-5  and on the left at C5-6. Electronically Signed   By: Pedro Earls M.D.   On: 05/28/2020 09:24      Flora Lipps, MD  [facility] 05/29/2020  If 7PM-7AM, please contact night-coverage

## 2020-05-29 NOTE — Progress Notes (Signed)
Initial Nutrition Assessment  DOCUMENTATION CODES:   Severe malnutrition in context of chronic illness,Underweight  INTERVENTION:   -Boost Plus TID- Each supplement provides 360kcal and 14g protein.    -Beneprotein powder TID with meals, each provides 25 kcals and 6g protein  -Magic cup BID with meals, each supplement provides 290 kcal and 9 grams of protein  -Placed tips to increase calories and protein in discharge instructions -reviewed these with pt and son  NUTRITION DIAGNOSIS:   Severe Malnutrition related to chronic illness,dysphagia as evidenced by percent weight loss,severe fat depletion,severe muscle depletion.  GOAL:   Patient will meet greater than or equal to 90% of their needs  MONITOR:   PO intake,Supplement acceptance,Labs,Weight trends,I & O's  REASON FOR ASSESSMENT:   Malnutrition Screening Tool    ASSESSMENT:   79 y.o. female with history of diabetes mellitus type 2, hypertension, CAD status post stent presented to hospital with difficulty swallowing, hoarseness and weight loss with nasal intonation. Patient primary care patient had an ultrasound of the thyroid last month which was unremarkable.  Had followed up with her primary care physician today and was found to be tachycardic and was referred to the ER.  Patient states she is able to swallow but has to really put a lot of effort for the solids.    Patient reported significant weight loss recently and overall decline for a year or so.Given the weight loss, dysphagia and failed swallow patient was admitted to hospital for further observation and possible GI work-up.  Patient in room with son at bedside. Pt reports poor appetite for a year now but it has worsened over the last month. Pt reports she did eat pretty well yesterday. PO intakes were 70% of breakfast, 40% of lunch and 100% of dinner. Pt has a hard time fixing meals for herself, her son and daugher-in-law cook for her but they work. They are  interested in pt receiving Meals on Wheels.  Pt drinks Boost supplements at home, 1-2 daily. Encouraged pt drink at least 2 daily. Ideally 3.  Pt didn't eat as much breakfast this morning, some cereal and sausage. States her eggs were too done.  SLP has evaluated pt given dysphagia. Recommended dysphagia 3 diet.  Son states he plans to ask the doctor about an appetite stimulant.  Per weight records, pt has lost 33 lbs since 1/14 (28% wt loss x 11 months, significant for time frame).   Medications: Miralax  Labs reviewed: CBGs: 119-159  NUTRITION - FOCUSED PHYSICAL EXAM:  Flowsheet Row Most Recent Value  Orbital Region Severe depletion  Upper Arm Region Severe depletion  Thoracic and Lumbar Region Unable to assess  Buccal Region Moderate depletion  Temple Region Severe depletion  Clavicle Bone Region Severe depletion  Clavicle and Acromion Bone Region Severe depletion  Scapular Bone Region Severe depletion  Dorsal Hand Severe depletion  Patellar Region Severe depletion  Anterior Thigh Region Severe depletion  Posterior Calf Region Severe depletion  Edema (RD Assessment) None  Hair Reviewed  Eyes Reviewed  Mouth Reviewed       Diet Order:   Diet Order            DIET DYS 3 Room service appropriate? Yes; Fluid consistency: Thin  Diet effective now                 EDUCATION NEEDS:   Education needs have been addressed  Skin:  Skin Assessment: Reviewed RN Assessment  Last BM:  12/15  Height:  Ht Readings from Last 1 Encounters:  05/26/20 5\' 2"  (1.575 m)    Weight:   Wt Readings from Last 1 Encounters:  05/26/20 38.1 kg    BMI:  Body mass index is 15.36 kg/m.  Estimated Nutritional Needs:   Kcal:  1250-1450  Protein:  60-75g  Fluid:  1.5L/day   Clayton Bibles, MS, RD, LDN Inpatient Clinical Dietitian Contact information available via Amion

## 2020-05-29 NOTE — Evaluation (Signed)
Physical Therapy Evaluation Patient Details Name: [name] Wright MRN: [id number] DOB: [birth year] Today's Date: 05/29/2020   History of Present Illness  79 y.o. female with history of diabetes mellitus type 2, hypertension, CAD status post stent presented to hospital with difficulty swallowing, hoarseness and weight loss with nasal intonation.MRI of the brain and cervical spine was obtained.  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  MRI cervical spine shows degenerative changes.  Clinical Impression  Patient evaluated by Physical Therapy with no further acute PT needs identified. All education has been completed and the patient has no further questions.  Pt with recent falls and today with distal LLE weakness, decr df/L toe drag during gait. Recommend HHPT given these factors to incr pt safety and prevent falls. See below for any follow-up Physical Therapy or equipment needs. PT is signing off. Thank you for this referral.     Follow Up Recommendations Home health PT    Equipment Recommendations  None recommended by PT    Recommendations for Other Services       Precautions / Restrictions Precautions Precautions: Fall Restrictions Weight Bearing Restrictions: No      Mobility  Bed Mobility Overal bed mobility: Needs Assistance Bed Mobility: Supine to Sit     Supine to sit: Min guard;Supervision     General bed mobility comments: for safety, no physical assist    Transfers Overall transfer level: Needs assistance Equipment used: Rolling walker (2 wheeled) Transfers: Sit to/from Stand Sit to Stand: Supervision         General transfer comment: for safety  Ambulation/Gait Ambulation/Gait assistance: Min guard;Supervision Gait Distance (Feet): 100 Feet Assistive device: Rolling walker (2 wheeled) Gait Pattern/deviations: Step-through pattern;Decreased stride length;Decreased dorsiflexion - left     General Gait Details:  min/guard for safety with turns, to maneuver RW (uses rollator at baseline). steady with RW, no overt LOB  Stairs            Wheelchair Mobility    Modified Rankin (Stroke Patients Only)       Balance Overall balance assessment: History of Falls;Needs assistance Sitting-balance support: No upper extremity supported;Feet supported Sitting balance-Leahy Scale: Good       Standing balance-Leahy Scale: Fair Standing balance comment: reliant on UEs for dynamic tasks                             Pertinent Vitals/Pain Pain Assessment: No/denies pain    Home Living Family/patient expects to be discharged to:: Private residence Living Arrangements: Children;Other relatives (son and dtr, both work 12hr shifts, son-RN works nights at [employer], dtr works days) Available Help at Discharge: Family;Available PRN/intermittently Type of Home: House Home Access: Level entry     Home Layout: Two level;Bed/bath upstairs Home Equipment: Walker - 4 wheels;Shower seat - built in      Prior Function Level of Independence: Needs assistance   Gait / Transfers Assistance Needed: amn with rollator  ADL's / Homemaking Assistance Needed: reports independence; family assists with meals, heavier househould tasks        Hand Dominance        Extremity/Trunk Assessment   Upper Extremity Assessment Upper Extremity Assessment: Overall WFL for tasks assessed    Lower Extremity Assessment Lower Extremity Assessment: LLE deficits/detail;RLE deficits/detail RLE Deficits / Details: grossly 4/5 LLE Deficits / Details: L ankle df to neutral only, 2/%. knee and hip grossly 3 to  3+/5       Communication      Cognition Arousal/Alertness: Awake/alert Behavior During Therapy: WFL for tasks assessed/performed Overall Cognitive Status: Within Functional Limits for tasks assessed                                        General Comments      Exercises      Assessment/Plan    PT Assessment All further PT needs can be met in the next venue of care  PT Problem List         PT Treatment Interventions      PT Goals (Current goals can be found in the Care Plan section)  Acute Rehab PT Goals Patient Stated Goal: home! PT Goal Formulation: All assessment and education complete, DC therapy    Frequency     Barriers to discharge        Co-evaluation               AM-PAC PT "6 Clicks" Mobility  Outcome Measure Help needed turning from your back to your side while in a flat bed without using bedrails?: None Help needed moving from lying on your back to sitting on the side of a flat bed without using bedrails?: None Help needed moving to and from a bed to a chair (including a wheelchair)?: None Help needed standing up from a chair using your arms (e.g., wheelchair or bedside chair)?: None Help needed to walk in hospital room?: A Little Help needed climbing 3-5 steps with a railing? : A Little 6 Click Score: 22    End of Session Equipment Utilized During Treatment: Gait belt Activity Tolerance: Patient tolerated treatment well Patient left: with call bell/phone within reach;in bed;with bed alarm set Nurse Communication: Mobility status PT Visit Diagnosis: Other abnormalities of gait and mobility (R26.89);Muscle weakness (generalized) (M62.81)    Time: 9692-4932 PT Time Calculation (min) (ACUTE ONLY): 18 min   Charges:   PT Evaluation $PT Eval Low Complexity: [place], PT  Acute Rehab Dept ([facility]) [phone] Pager [phone]  05/29/2020   [facility] ([place] 05/29/2020, 4:11 PM

## 2020-05-30 DIAGNOSIS — E43 Unspecified severe protein-calorie malnutrition: Secondary | ICD-10-CM

## 2020-05-30 LAB — SEDIMENTATION RATE: Sed Rate: 2 mm/hr (ref 0–22)

## 2020-05-30 LAB — CK: Total CK: 33 U/L — ABNORMAL LOW (ref 38–234)

## 2020-05-30 LAB — VITAMIN B12: Vitamin B-12: 829 pg/mL (ref 180–914)

## 2020-05-30 LAB — GLUCOSE, CAPILLARY
Glucose-Capillary: 127 mg/dL — ABNORMAL HIGH (ref 70–99)
Glucose-Capillary: 140 mg/dL — ABNORMAL HIGH (ref 70–99)
Glucose-Capillary: 147 mg/dL — ABNORMAL HIGH (ref 70–99)
Glucose-Capillary: 195 mg/dL — ABNORMAL HIGH (ref 70–99)

## 2020-05-30 MED ORDER — BOOST PLUS PO LIQD: 237.0000 mL | Freq: Three times a day (TID) | ORAL | 2 refills | Status: AC

## 2020-05-30 MED ORDER — MEGESTROL ACETATE 400 MG/10ML PO SUSP: 400.0000 mg | Freq: Every day | ORAL | 2 refills | Status: AC

## 2020-05-30 MED ORDER — RESOURCE INSTANT PROTEIN PO PWD PACKET: 1.0000 | Freq: Three times a day (TID) | ORAL | 2 refills | Status: AC

## 2020-05-30 NOTE — Discharge Summary (Signed)
Physician Discharge Summary  [name] Wright DGL:[id number] DOB: [birth year] DOA: 05/26/2020  PCP: Yvonna Alanis, NP  Admit date: 05/26/2020 Discharge date: 05/30/2020  Admitted From: Home  Discharge disposition: Home with home health  Recommendations for Outpatient Follow-Up:    Follow up with your primary care provider in one week.   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  Will need neurology follow-up as outpatient.  Internal referral has been made to [facility].  CT scan of the chest showed multiple pulmonary nodules.  Patient will need follow-up CT scan in 6 to 12 months.  Patient has been prescribed nutritional supplements at this time for severe protein calorie malnutrition.  She has been prescribed Megace as well.  Discharge Diagnosis:   Principal Problem:   Dysphagia Active Problems:   CAD- RCA DES 2006   Essential hypertension   Weight loss, unintentional   Protein-calorie malnutrition, severe  Discharge Condition: Improved.  Diet recommendation: Dysphagia III diet/diabetic  Wound care: None.  Code status: Full.   History of Present Illness:   [name] Wright is a 79 y.o. female with history of diabetes mellitus type 2, hypertension, CAD status post stent presented to hospital with difficulty swallowing, hoarseness and weight loss with nasal intonation. Patient primary care patient had an ultrasound of the thyroid last month which was unremarkable.  Had followed up with her primary care physician today and was found to be tachycardic and was referred to the ER.  Patient states she is able to swallow but has to really put a lot of effort for the solids.    Patient reported significant weight loss recently and overall decline for a year or so. In the ER, patient was mildly tachycardic not hypoxic. ER physician did a CT chest and neck without contrast which were largely unremarkable except for some  pulmonary nodules.  Labs are unremarkable except for hyperglycemia.  Bedside swallow was done which patient failed.  Given the weight loss, dysphagia and failed swallow patient was admitted to hospital for further observation and workup.  Hospital Course:   Following conditions were addressed during hospitalization as listed below,  Dysphagia, hoarseness of voice, significant weight loss, nasal intonation, tongue fasciculations, progressive difficulty with mobility.  Possibility of motor neuron disease.    Seen by neurology.   MRI of the brain and cervical spine was obtained.  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  MRI cervical spine shows degenerative changes.  Speech and swallow has seen the patient and currently on  dysphagia 3 diet.  Patient was seen by physical therapy recommend home PT on discharge.  Neurology recommends outpatient follow-up at [facility] neurology clinic for EMG and further work-up.  ESR was 2.  Vitamin B12 was 829.  Total CK of 33.  Protein electrophoresis immunofixation ANA have been sent from the hospital pending at this time.  We will add Megace liquid on discharge.  Continue nutritional supplements on discharge.  Essential hypertension.    Continue amlodipine   Diabetes mellitus type 2.  Will resume diabetic diet, home with her regimen.  History of coronary artery disease status post stent.  Pravastatin continue.  Pulmonary nodules.  Multiple.  Will need outpatient follow-up with the CT scan.  Recommendation is CT scan in 6 to 12 months.  Disposition.  At this time, patient is stable for disposition home with home health.  Patient will need  to follow-up with her primary care physician and [facility].  Spoke with the patient's son [name] prior to disposition.  Medical Consultants:    Neurology  Procedures:    None Subjective:   Today, patient seen and examined at bedside.  Patient states that that she  feels better denies any pain, nausea, vomiting, shortness of breath.  On dysphagia 3 diet  Discharge Exam:   Vitals:   05/29/20 1941 05/30/20 0404  BP: (!) 150/86 130/64  Pulse: 93 87  Resp: 18 14  Temp: 98 F (36.7 C) 97.9 F (36.6 C)  SpO2: 95% 94%   Vitals:   05/28/20 2019 05/29/20 0511 05/29/20 1941 05/30/20 0404  BP: 138/74 139/79 (!) 150/86 130/64  Pulse: 93 92 93 87  Resp: '14 14 18 14  ' Temp: 97.9 F (36.6 C) 98 F (36.7 C) 98 F (36.7 C) 97.9 F (36.6 C)  TempSrc: Oral Oral Oral Oral  SpO2: 94% 94% 95% 94%  Weight:      Height:       General: Alert awake, not in obvious distress, thinly built, communicative. HENT: pupils equally reacting to light,  No scleral pallor or icterus noted. Oral mucosa is moist.  Tongue fasciculations.  Nasal intonation.  Hoarse voice Chest:   Diminished breath sounds bilaterally. .  CVS: S1 &S2 heard. No murmur.  Regular rate and rhythm. Abdomen: Soft, nontender, nondistended.  Bowel sounds are heard.   Extremities: No cyanosis, clubbing or edema.  Peripheral pulses are palpable.  Cachectic extremities Psych: Alert, awake and communicative. CNS:  moving all extremities.  Tongue fasciculations, hoarse voice Skin: Warm and dry.  No rashes noted.  The results of significant diagnostics from this hospitalization (including imaging, microbiology, ancillary and laboratory) are listed below for reference.     Diagnostic Studies:   DG Chest 2 View  Result Date: 05/26/2020 CLINICAL DATA:  Tachycardia. EXAM: CHEST - 2 VIEW COMPARISON:  Chest x-ray dated February 19, 2017. FINDINGS: The heart size and mediastinal contours are within normal limits. Both lungs are clear. The visualized skeletal structures are unremarkable. IMPRESSION: No active cardiopulmonary disease. Electronically Signed   By: Titus Dubin M.D.   On: 05/26/2020 19:17   CT Soft Tissue Neck Wo Contrast  Result Date: 05/27/2020 CLINICAL DATA:  Hoarseness. EXAM: CT NECK  WITHOUT CONTRAST TECHNIQUE: Multidetector CT imaging of the neck was performed following the standard protocol without intravenous contrast. COMPARISON:  None. FINDINGS: Pharynx and larynx: Mildly motion degraded study, particularly at the supraglottic level. Contour abnormality of the posterior right hypopharynx is suspected to be a retropharyngeal internal carotid artery, but difficult to be sure without IV contrast. No other pharyngeal or laryngeal abnormality. Salivary glands: No inflammation, mass, or stone. Thyroid: Normal. Lymph nodes: None enlarged or abnormal density. Vascular: Aberrant right subclavian artery. Limited intracranial: Negative. Visualized orbits: Negative. Mastoids and visualized paranasal sinuses: Clear. Skeleton: No acute or aggressive process. Upper chest: Negative. Other: None. IMPRESSION: 1. Mildly motion degraded study, particularly at the level of the supraglottic larynx. 2. Contour abnormality of the posterior right hypopharynx is suspected to be a retropharyngeal internal carotid artery, but difficult to be sure without IV contrast. Otherwise normal appearance of the pharynx and larynx. 3. Aberrant right subclavian artery. Electronically Signed   By: Ulyses Jarred M.D.   On: 05/27/2020 01:28   CT Chest Wo Contrast  Result Date: 05/27/2020 CLINICAL DATA:  Unintentional weight loss, intermittent dyspnea EXAM: CT CHEST WITHOUT CONTRAST TECHNIQUE: Multidetector CT imaging of  the chest was performed following the standard protocol without IV contrast. COMPARISON:  None. FINDINGS: Cardiovascular: Extensive multi-vessel coronary artery calcification. Global cardiac size within normal limits. No pericardial effusion. Central pulmonary arteries are of normal caliber. Variant arch anatomy is identified with aberrant origin of the right subclavian artery and common origin of the common carotid arteries. Mild dilation of the aortic arch is identified measuring 3.1 cm in greatest dimension  beyond the takeoff of the left subclavian artery. The ascending aorta and more distal descending aorta are of normal caliber. Moderate atherosclerotic calcification within the thoracic aorta. Mediastinum/Nodes: No enlarged mediastinal or axillary lymph nodes. Thyroid gland, trachea, and esophagus demonstrate no significant findings. Lungs/Pleura: 6 mm ground-glass pulmonary nodule, right upper lobe, image # 49. 4 mm noncalcified pulmonary nodule, right lower lobe, image # 70. No other focal pulmonary nodules or infiltrates. No pneumothorax or pleural effusion. Central airways are widely patent. Upper Abdomen: Tiny cyst within the visualized left hepatic lobe. No acute abnormality. Musculoskeletal: Osseous structures appear osteopenic. No acute bone abnormality. No focal lytic or blastic bone lesions. IMPRESSION: No definite radiographic explanation for the patient's reported weight loss. Multiple pulmonary nodules. Initial follow-up with CT at 6-12 months is recommended to confirm persistence. If persistent, repeat CT is recommended every 2 years until 5 years of stability has been established. This recommendation follows the consensus statement: Guidelines for Management of Incidental Pulmonary Nodules Detected on CT Images: From the Fleischner Society 2017; Radiology 2017; 284:228-243. 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 Recommend annual imaging followup by CTA or MRA. This recommendation follows 2010 ACCF/AHA/AATS/ACR/ASA/SCA/SCAI/SIR/STS/SVM Guidelines for the Diagnosis and Management of Patients with Thoracic Aortic Disease. Circulation.2010; 121: Z308-M578. Aortic aneurysm NOS (ICD10-I71.9) Aortic Atherosclerosis (ICD10-I70.0). 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 Elbow  Cancer ([place])   skin and eye  Constipation   Coronary artery disease   NUCLEAR STRESS TEST, 09/30/2009 - EKG negative for ischemia  Depression   Diabetes mellitus without complication (HCC)   Treated by diet  Essential tremor   hands, sometimes legs   Family history of coronary artery disease   Mother died 46  GERD (gastroesophageal reflux disease)   H/O subconjunctival hemorrhage   Heart attack ([place])   History of blood transfusion   History of failure to thrive syndrome   History of weakness   Hyperlipidemia   Hypertension   Keratoacanthoma 01/05/2011  Left Forearm  Lumbar back pain with radiculopathy affecting lower extremity   Myocardial infarction [facility]) 2006  Neck pain   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/2014  Left Chin  Osteopenia   Presence of pessary   Retroperitoneal hematoma   S/P  Schatzki's ring   Shingles   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  Left Upper Bicep  Superficial nodular basal cell carcinoma (BCC) 03/02/2016  Left Shoulder  Trapezius muscle spasm   right side  Uterovaginal prolapse  Weight loss  Past Surgical History: Past Surgical History: Procedure Laterality Date  CATARACT EXTRACTION, BILATERAL    COLONOSCOPY    CORONARY ANGIOPLASTY WITH STENT PLACEMENT  11/05/2004  RCA DES  DILATION AND CURETTAGE OF UTERUS    LID LESION EXCISION Left 02/14/2018  Procedure: LEFT LID LESION EXCISION;  Surgeon: Clista Bernhardt, MD;  Location: [place];  Service: Ophthalmology;  Laterality: Left;  RECONSTRUCTION OF EYELID Left 02/14/2018  Procedure:  TOTAL RECONSTRUCTION OF EYELID;  Surgeon: Clista Bernhardt, MD;  Location: [place];  Service: Ophthalmology;  Laterality: Left;  SKIN FULL THICKNESS GRAFT Left 02/14/2018  Procedure: SKIN GRAFT FULL THICKNESS LEFT EYE;  Surgeon: Clista Bernhardt, MD;  Location: [place];  Service: Ophthalmology;  Laterality: Left;  TONSILECTOMY/ADENOIDECTOMY WITH MYRINGOTOMY    TUBAL LIGATION   HPI: Pt is an 79 yo female with ED visit due to progressive dysphagia, weakness and significant weight loss over the last year.  Pt has hoarseness and dysarthria.  She also admits to progressive weakness overall.  PMH also + for essential tremor, MI, Schaztki's rin, Shingles, GERD, CAD s/p stenting.   CXR negative for acute findings.  Pt failed RN [place] and thus SLP was ordered.  Pt hypernasal and dysarthric and reports that her speech has been difficult to understand over the last few months thus she does not speak on the phone.  She denies requiring heimlich manuever.  Pt admits to gustatory changes and progressive dysphagia to food more than liquids.  She also states she must be careful taking pills - pointing to near vallecular region and pyriform to indicate area of sensation of residuals.  Subjective: pt awake on flouro chair with son present Assessment / Plan / Recommendation CHL IP CLINICAL IMPRESSIONS 05/27/2020 Clinical Impression Mild oropharyngeal dysphagia with sensorimotor deficits. . Decreased oropharyngeal motility results in mild pharyngeal residuals and laryngeal penetration/trace aspiration of liquids. Cued cough was weak and not effective to clear aspirates.  Dry swallows helpful to decrease pharyngeal retention. Postures of head turn right/left did not aid pharyngeal clearance (turn to the right worsened clearance significantly) and chin tuck not tested due to pt's discomfort with position.  Palatal deviation to right upon phonation noted thus concern for pharyngeal weakness on left.  Pt only tested in sagittal view due to  physical limitations.  Pt takes very small bites/sips to compensate (1/8 tsp during testing) and this has certainly contributed to her weight loss.  She declined to swallow a barium tablet when given with options of taking with puree or thin. Question source of dysphagia - ? Neuro, ? Indication for imaging to help elucidate source.  Recommend Dys3/thin diet with precautions.    Consider medications crushed if large and give with applesauce.  (start and follow with liquids)  Advise pt drink water with meals due to her trace aspiration.    Multiple swallows per bite/sip and follow solid with liquid.    Maximize liquid nutrition - pt likes chocolate Boost.    Stop intake if pt coughing with intake. SLP Visit Diagnosis Dysphagia, oropharyngeal phase (R13.12) Attention and concentration deficit following -- Frontal lobe and executive function deficit following -- Impact on safety and function Mild aspiration risk   CHL IP TREATMENT RECOMMENDATION 05/27/2020 Treatment Recommendations Therapy as outlined in treatment plan below   Prognosis 05/27/2020 Prognosis for Safe Diet Advancement Fair Barriers to Reach Goals Time post onset;Other (Comment) Barriers/Prognosis Comment -- CHL IP DIET RECOMMENDATION 05/27/2020 SLP Diet Recommendations -- Liquid Administration via Cup  Medication Administration Crushed with puree Compensations Slow rate;Small sips/bites;Follow solids with liquid Postural Changes Remain semi-upright after after feeds/meals (Comment);Seated upright at 90 degrees   CHL IP OTHER RECOMMENDATIONS 05/27/2020 Recommended Consults -- Oral Care Recommendations Oral care BID Other Recommendations --   CHL IP FOLLOW UP RECOMMENDATIONS 05/27/2020 Follow up Recommendations Other (comment)   CHL IP FREQUENCY AND DURATION 05/27/2020 Speech Therapy Frequency (ACUTE ONLY) min 1 x/week Treatment Duration 1 week      CHL IP ORAL PHASE 05/27/2020 Oral Phase Impaired Oral - Pudding Teaspoon -- Oral - Pudding Cup -- Oral - Honey  Teaspoon -- Oral - Honey Cup -- Oral - Nectar Teaspoon -- Oral - Nectar Cup WFL Oral - Nectar Straw -- Oral - Thin Teaspoon -- Oral - Thin Cup WFL Oral - Thin Straw WFL Oral - Puree Delayed oral transit;Piecemeal swallowing Oral - Mech Soft Delayed oral transit;Reduced posterior propulsion;Impaired mastication;Weak lingual manipulation Oral - Regular -- Oral - Multi-Consistency -- Oral - Pill -- Oral Phase - Comment --  CHL IP PHARYNGEAL PHASE 05/27/2020 Pharyngeal Phase Impaired Pharyngeal- Pudding Teaspoon -- Pharyngeal -- Pharyngeal- Pudding Cup -- Pharyngeal -- Pharyngeal- Honey Teaspoon -- Pharyngeal -- Pharyngeal- Honey Cup -- Pharyngeal -- Pharyngeal- Nectar Teaspoon -- Pharyngeal -- Pharyngeal- Nectar Cup Penetration/Aspiration during swallow;Reduced epiglottic inversion;Reduced airway/laryngeal closure;Reduced tongue base retraction Pharyngeal Material enters airway, remains ABOVE vocal cords and not ejected out Pharyngeal- Nectar Straw -- Pharyngeal -- Pharyngeal- Thin Teaspoon -- Pharyngeal -- Pharyngeal- Thin Cup Reduced epiglottic inversion;Reduced laryngeal elevation;Reduced airway/laryngeal closure;Reduced tongue base retraction;Penetration/Aspiration during swallow;Penetration/Apiration after swallow;Trace aspiration Pharyngeal Material enters airway, CONTACTS cords and not ejected out Pharyngeal- Thin Straw Reduced airway/laryngeal closure;Reduced laryngeal elevation;Penetration/Aspiration during swallow;Penetration/Apiration after swallow Pharyngeal Material enters airway, CONTACTS cords and not ejected out Pharyngeal- Puree Reduced epiglottic inversion;Pharyngeal residue - valleculae Pharyngeal Material does not enter airway Pharyngeal- Mechanical Soft Reduced epiglottic inversion;Pharyngeal residue - valleculae Pharyngeal Material does not enter airway Pharyngeal- Regular -- Pharyngeal -- Pharyngeal- Multi-consistency -- Pharyngeal -- Pharyngeal- Pill -- Pharyngeal -- Pharyngeal Comment --  CHL  IP CERVICAL ESOPHAGEAL PHASE 05/27/2020 Cervical Esophageal Phase WFL Pudding Teaspoon -- Pudding Cup -- Honey Teaspoon -- Honey Cup -- Nectar Teaspoon -- Nectar Cup -- Nectar Straw -- Thin Teaspoon -- Thin Cup -- Thin Straw -- Puree -- Mechanical Soft -- Regular -- Multi-consistency -- Pill -- Cervical Esophageal Comment -- Kathleen Lime, MS [facility] SLP Acute Rehab Services Office [phone] Pager [phone] Macario Golds 05/27/2020, 2:32 PM                Labs:   Basic Metabolic Panel: Recent Labs  Lab 05/26/20 1752  NA 136  K 4.0  CL 101  CO2 26  GLUCOSE 245*  BUN 15  CREATININE 0.40*  CALCIUM 9.0   GFR Estimated Creatinine Clearance: 33.7 mL/min (A) (by C-G formula based on SCr of 0.4 mg/dL (L)). Liver Function Tests: Recent Labs  Lab 05/27/20 0040  AST 14*  ALT 15  ALKPHOS 72  BILITOT 0.6  PROT 6.7  ALBUMIN 4.2   No results for input(s): LIPASE, AMYLASE in the last 168 hours. No results for input(s): AMMONIA in the last 168 hours. Coagulation profile No results for input(s): INR, PROTIME in the last 168 hours.  CBC: Recent Labs  Lab 05/26/20 1752  WBC 10.1  HGB 14.3  HCT 43.9  MCV 91.5  PLT 367   Cardiac Enzymes: Recent Labs  Lab 05/30/20 0621  CKTOTAL 33*   BNP: Invalid input(s): POCBNP CBG:  Recent Labs  Lab 05/29/20 1840 05/29/20 2012 05/30/20 0002 05/30/20 0407 05/30/20 0742  GLUCAP 151* 191* 127* 140* 147*   D-Dimer No results for input(s): DDIMER in the last 72 hours. Hgb A1c No results for input(s): HGBA1C in the last 72 hours. Lipid Profile No results for input(s): CHOL, HDL, LDLCALC, TRIG, CHOLHDL, LDLDIRECT in the last 72 hours. Thyroid function studies No results for input(s): TSH, T4TOTAL, T3FREE, THYROIDAB in the last 72 hours.  Invalid input(s): FREET3 Anemia work up Recent Labs    05/30/20 [place]   Microbiology Recent Results (from the past 240 hour(s))  Resp Panel by RT-PCR (Flu A&B, Covid)  Nasopharyngeal Swab     Status: None   Collection Time: 05/27/20 12:43 AM   Specimen: Nasopharyngeal Swab; Nasopharyngeal(NP) swabs in vial transport medium  Result Value Ref Range Status   SARS Coronavirus 2 by RT PCR NEGATIVE NEGATIVE Final    Comment: (NOTE) SARS-CoV-2 target nucleic acids are NOT DETECTED.  The SARS-CoV-2 RNA is generally detectable in upper respiratory specimens during the acute phase of infection. The lowest concentration of SARS-CoV-2 viral copies this assay can detect is 138 copies/mL. A negative result does not preclude SARS-Cov-2 infection and should not be used as the sole basis for treatment or other patient management decisions. A negative result may occur with  improper specimen collection/handling, submission of specimen other than nasopharyngeal swab, presence of viral mutation(s) within the areas targeted by this assay, and inadequate number of viral copies(<138 copies/mL). A negative result must be combined with clinical observations, patient history, and epidemiological information. The expected result is Negative.  Fact Sheet for Patients:  EntrepreneurPulse.com.au  Fact Sheet for Healthcare Providers:  IncredibleEmployment.be  This test is no t yet approved or cleared by the [place] FDA and  has been authorized for detection and/or diagnosis of SARS-CoV-2 by FDA under an Emergency Use Authorization (EUA). This EUA will remain  in effect (meaning this test can be used) for the duration of the COVID-19 declaration under Section 564(b)(1) of the Act, 21 U.S.C.section 360bbb-3(b)(1), unless the authorization is terminated  or revoked sooner.       Influenza A by PCR NEGATIVE NEGATIVE Final   Influenza B by PCR NEGATIVE NEGATIVE Final    Comment: (NOTE) The Xpert Xpress SARS-CoV-2/FLU/RSV plus assay is intended as an aid in the diagnosis of influenza from Nasopharyngeal swab specimens and should not be  used as a sole basis for treatment. Nasal washings and aspirates are unacceptable for Xpert Xpress SARS-CoV-2/FLU/RSV testing.  Fact Sheet for Patients: EntrepreneurPulse.com.au  Fact Sheet for Healthcare Providers: IncredibleEmployment.be  This test is not yet approved or cleared by the [place] FDA and has been authorized for detection and/or diagnosis of SARS-CoV-2 by FDA under an Emergency Use Authorization (EUA). This EUA will remain in effect (meaning this test can be used) for the duration of the COVID-19 declaration under Section 564(b)(1) of the Act, 21 U.S.C. section 360bbb-3(b)(1), unless the authorization is terminated or revoked.  Performed at [facility], [place] [address], [place], [place] [address]      Discharge Instructions:   Discharge Instructions     Ambulatory referral to Neurology   Complete by: As directed    An appointment is requested in approximately: 1 week, c/f ALS   Call MD for:  persistant nausea and vomiting   Complete by: As directed    Call MD for:  temperature >100.4   Complete by: As directed  Diet - low sodium heart healthy   Complete by: As directed    Dysphagia 3 diet   Discharge instructions   Complete by: As directed    Follow-up with your primary care physician in 1 week.  Check blood work at that time.  Follow-up with [facility] when scheduled by the clinic.  Internal referral has been made.  Seek medical attention for worsening symptoms.  Take aspiration precautions.  Remain on dysphagia III diet. Continue nutritional supplements.   Increase activity slowly   Complete by: As directed       Allergies as of 05/30/2020      Reactions   Avelox [moxifloxacin Hcl In Nacl] Anaphylaxis, Nausea Only, Other (See Comments)   Throat and tongue swelling  (Critical)   Altace [ramipril] Other (See Comments)   Tremors and increased tinnitus   Hctz  [hydrochlorothiazide] Other (See Comments)   Palpitations and tremors   Lisinopril Other (See Comments)   Headaches   Losartan Other (See Comments)   Palpitations and tremors   Statins Other (See Comments)   Increased blood pressure   Sulfa Antibiotics Rash      Medication List    TAKE these medications   acetaminophen 500 MG tablet Commonly known as: TYLENOL Take 1,000 mg by mouth as needed for mild pain.   amLODipine 2.5 MG tablet Commonly known as: NORVASC Take 1 tablet (2.5 mg total) by mouth daily.   Aspirin Buf(CaCarb-MgCarb-MgO) 81 MG Tabs Take 81 mg by mouth daily.   Coenzyme Q10 100 MG capsule Take 100 mg by mouth daily.   Difluprednate 0.05 % Emul Place 1 drop into the right eye daily.   DULoxetine 30 MG capsule Commonly known as: CYMBALTA Take 30 mg by mouth 2 (two) times daily.   ertugliflozin L-PyroglutamicAc 5 MG Tabs tablet Commonly known as: STEGLATRO Take 5 mg by mouth daily.   fluticasone 50 MCG/ACT nasal spray Commonly known as: FLONASE Place 2 sprays into both nostrils daily as needed for allergies or rhinitis.   lactose free nutrition Liqd Take 237 mLs by mouth 3 (three) times daily with meals.   loratadine 10 MG tablet Commonly known as: CLARITIN Take 10 mg by mouth every other day.   megestrol 400 MG/10ML suspension Commonly known as: MEGACE Take 10 mLs (400 mg total) by mouth daily.   metFORMIN 500 MG 24 hr tablet Commonly known as: GLUCOPHAGE-XR Take 1,000 mg by mouth 2 (two) times daily.   nitroGLYCERIN 0.4 MG SL tablet Commonly known as: NITROSTAT Place 1 tablet (0.4 mg total) under the tongue every 5 (five) minutes as needed for chest pain.   polyethylene glycol 17 g packet Commonly known as: MIRALAX / GLYCOLAX Take 17 g by mouth every other day.   pravastatin 20 MG tablet Commonly known as: PRAVACHOL Take 1 tablet (20 mg total) by mouth every evening.   Propylene Glycol 0.6 % Soln Place 1 drop into both eyes as needed  (dry eyes).   protein supplement 6 g Powd Commonly known as: RESOURCE BENEPROTEIN Take 1 Scoop (6 g total) by mouth 3 (three) times daily with meals.           Follow-up Information     Yvonna Alanis, NP. Schedule an appointment as soon as possible for a visit in 1 week(s).   Specialty: Adult Health Nurse Practitioner Why: regular followup, blood work Contact information: [address] [place] [address] [phone]         Gwenlyn Found,  Pearletha Forge, MD .   Specialties: Cardiology, Radiology Contact information: [address] [place] [address] [phone]                  Time coordinating discharge: 39 minutes  Signed:  Kiowa Hollar  [facility] 05/30/2020, 11:10 AM

## 2020-05-30 NOTE — Progress Notes (Signed)
RT NOTE:  NIF -15 (best of 3 attempts) VC 1.1L (best of 3 attempts)  Pt had good effort. Vitals stable at this time, RT will continue to monitor.

## 2020-05-30 NOTE — TOC Initial Note (Signed)
Transition of Care [facility]) - Initial/Assessment Note    Patient Details  Name: [name] Wright MRN: [id number] Date of Birth: 1940/08/08  Transition of Care [facility] CM/SW Contact:    Joaquin Courts, RN Phone Number: 05/30/2020, 3:14 PM  Clinical Narrative:                 [facility] to provide [facility] services.    Expected Discharge Plan: [place] Barriers to Discharge: No Barriers Identified   Patient Goals and CMS Choice Patient states their goals for this hospitalization and ongoing recovery are:: to go home with therapy CMS Medicare.gov Compare Post Acute Care list provided to:: Patient Choice offered to / list presented to : Patient  Expected Discharge Plan and Services Expected Discharge Plan: [place]   Discharge Planning Services: CM Consult Post Acute Care Choice: [place] arrangements for the past 2 months: Single Family Home Expected Discharge Date: 05/30/20               DME Arranged: N/A DME Agency: NA       HH Arranged: PT HH Agency: [facility] Date [place]: 05/30/20 Time [place]: 1514 Representative spoke with at [place]: [place] Arrangements/Services Living arrangements for the past 2 months: [place]   Patient language and need for interpreter reviewed:: Yes Do you feel safe going back to the place where you live?: Yes      Need for Family Participation in Patient Care: Yes (Comment) Care giver support system in place?: Yes (comment)   Criminal Activity/Legal Involvement Pertinent to Current Situation/Hospitalization: No - Comment as needed  Activities of Daily Living Home Assistive Devices/Equipment: Built-in shower seat,Grab bars in shower,Eyeglasses,Other (Comment) (walk-in shower) ADL Screening (condition at time of admission) Patient's cognitive ability adequate to safely complete daily activities?: Yes Is the patient deaf or have  difficulty hearing?: No Does the patient have difficulty seeing, even when wearing glasses/contacts?: No Does the patient have difficulty concentrating, remembering, or making decisions?: No Patient able to express need for assistance with ADLs?: Yes Does the patient have difficulty dressing or bathing?: Yes Independently performs ADLs?: No Communication: Independent Dressing (OT): Needs assistance Is this a change from baseline?: Change from baseline, expected to last >3 days Grooming: Needs assistance Is this a change from baseline?: Change from baseline, expected to last >3 days Feeding: Needs assistance Is this a change from baseline?: Change from baseline, expected to last >3 days Bathing: Needs assistance Is this a change from baseline?: Change from baseline, expected to last >3 days Toileting: Needs assistance Is this a change from baseline?: Change from baseline, expected to last >3days In/Out Bed: Needs assistance Is this a change from baseline?: Change from baseline, expected to last >3 days Walks in Home: Needs assistance Is this a change from baseline?: Change from baseline, expected to last >3 days Does the patient have difficulty walking or climbing stairs?: Yes (secondary to weakness) Weakness of Legs: Both Weakness of Arms/Hands: Both  Permission Sought/Granted                  Emotional Assessment Appearance:: Appears stated age Attitude/Demeanor/Rapport: Engaged Affect (typically observed): Accepting Orientation: : Oriented to Self,Oriented to Place,Oriented to  Time,Oriented to Situation   Psych Involvement: No (comment)  Admission diagnosis:  Weight loss, unintentional [R63.4] Failure to thrive in adult [R62.7] 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 Protein-calorie malnutrition, severe 05/29/2020  . Dysphagia 05/27/2020  . Failure to thrive  in adult   . 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 Hoarseness of voice 04/21/2020  . Weight loss, unintentional 04/21/2020  . Fatigue 04/21/2020  . Frailty syndrome in geriatric patient 04/21/2020  . Chronic low back pain without sciatica 04/21/2020  . Aspirin intolerance 06/27/2019  . Failure to thrive (0-17) 02/19/2017  . Shingles right face and eye 02/19/2017  . CAD- RCA DES 2006   . Dyslipidemia, goal LDL below 70   . Essential hypertension   . Non-insulin treated type 2 diabetes mellitus ([place])    PCP:  Yvonna Alanis, NP Pharmacy:   [facility] [address] [place] [address] Phone: [phone] Fax: [phone]  [id number] [place], [place] - [address] [place] [address] Phone: [phone] Fax: [phone]  [facility] [place], [place] - [place] [address] [place] [address] Phone: [phone] Fax: [phone]  [facility] [place], [place] [address] [place] [address] Phone: [phone] Fax: [phone]     Social Determinants of Health (SDOH) Interventions    Readmission Risk Interventions No flowsheet data found.

## 2020-06-01 ENCOUNTER — Telehealth: Payer: Self-pay | Admitting: *Deleted

## 2020-06-01 LAB — ANA W/REFLEX IF POSITIVE: Anti Nuclear Antibody (ANA): NEGATIVE

## 2020-06-01 NOTE — Telephone Encounter (Signed)
Transition Care Management Follow-Up Telephone Call   Date discharged and where:05/30/2020 [place]  How have you been since you were released from the hospital? Getting better  Any patient concerns? No  Items Reviewed:   Meds: Yes  Allergies:Yes  Dietary Changes Reviewed: Yes  Functional Questionnaire:  Independent-I Dependent-D  ADLs: I with assistance   Dressing- I with assitance    Eating-I    Maintaining continence- I   Transferring-I with assistance    Transportation-D   Meal Prep-D   Managing Meds- I  Confirmed importance and Date/Time of follow-up visits scheduled: 06/08/2020 with [name]   Confirmed with patient if condition worsens to call PCP or go to the Emergency Dept. Patient was given office number and encouraged to call back with questions or concerns:

## 2020-06-02 ENCOUNTER — Ambulatory Visit (INDEPENDENT_AMBULATORY_CARE_PROVIDER_SITE_OTHER): Payer: Medicare Other | Admitting: Neurology

## 2020-06-02 ENCOUNTER — Encounter: Payer: Self-pay | Admitting: Neurology

## 2020-06-02 VITALS — BP 135/79 | HR 116 | Ht 62.0 in | Wt 81.5 lb

## 2020-06-02 DIAGNOSIS — E538 Deficiency of other specified B group vitamins: Secondary | ICD-10-CM

## 2020-06-02 DIAGNOSIS — R1312 Dysphagia, oropharyngeal phase: Secondary | ICD-10-CM

## 2020-06-02 DIAGNOSIS — I25811 Atherosclerosis of native coronary artery of transplanted heart without angina pectoris: Secondary | ICD-10-CM | POA: Diagnosis not present

## 2020-06-02 LAB — PROTEIN ELECTROPHORESIS, SERUM
A/G Ratio: 1.5 (ref 0.7–1.7)
Albumin ELP: 3.4 g/dL (ref 2.9–4.4)
Alpha-1-Globulin: 0.2 g/dL (ref 0.0–0.4)
Alpha-2-Globulin: 0.6 g/dL (ref 0.4–1.0)
Beta Globulin: 0.9 g/dL (ref 0.7–1.3)
Gamma Globulin: 0.6 g/dL (ref 0.4–1.8)
Globulin, Total: 2.3 g/dL (ref 2.2–3.9)
Total Protein ELP: 5.7 g/dL — ABNORMAL LOW (ref 6.0–8.5)

## 2020-06-02 LAB — IMMUNOFIXATION ELECTROPHORESIS
IgA: 214 mg/dL (ref 64–422)
IgG (Immunoglobin G), Serum: 626 mg/dL (ref 586–1602)
IgM (Immunoglobulin M), Srm: 60 mg/dL (ref 26–217)
Total Protein ELP: 5.6 g/dL — ABNORMAL LOW (ref 6.0–8.5)

## 2020-06-02 NOTE — Progress Notes (Signed)
Reason for visit: Progressive dysphagia, weight loss, weakness  Referring physician: [facility]  [name] Wright is a 79 y.o. female  History of present illness:  [name] Wright is an 79 year old right-handed white female with a history of diabetes, hypertension, coronary artery disease, and benign essential tremor.  The patient has had a 1 year history of gradually worsening speech issues with slurring of the speech and garbled speech.  She has developed difficulty swallowing, she has had a significant weight loss going from a weight of around 130 to a weight of 81 pounds.  The patient has developed some weakness in the left leg primarily, she has had some difficulty walking, she has been using a walker for about 6 months.  She denies issues controlling the bowels or the bladder but does report some constipation problems.  She denies any memory problems.  She at times has trouble handling even her own saliva with swallowing.  She does report some low back issues and cannot stand up straight.  She denies any numbness of the extremities and denies any muscle cramps.  She has not noted any twitching in the muscles.  Due to her significant failure to thrive, she was admitted to the hospital on 26 May 2020.  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  Blood work revealed a hemoglobin A1c of 7.7, and a CK level of 33.  She was seen by neurology, the possibility of ALS was entertained.  She is sent to this office for an evaluation.  Past Medical History:  Diagnosis Date  . Aortic atherosclerosis ([place])   . BCC (basal cell carcinoma of skin) 06/24/1993   Left Shoulder  . 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 Cancer ([place])    skin and eye  . Constipation   . Coronary artery disease    NUCLEAR STRESS TEST, 09/30/2009 - EKG negative for  ischemia  . Depression   . Diabetes mellitus without complication ([place])    Treated by diet  . Essential tremor    hands, sometimes legs   . Family history of coronary artery disease    Mother died 87  . GERD (gastroesophageal reflux disease)   . H/O subconjunctival hemorrhage   . Heart attack ([place])   . History of blood transfusion   . History of failure to thrive syndrome   . History of weakness   . Hyperlipidemia   . Hypertension   . 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 Lumbar back pain with radiculopathy affecting lower extremity   . Myocardial infarction ([place]) 2006  . Neck pain   . 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 Osteopenia   . Presence of pessary   . Retroperitoneal hematoma    S/P  . Schatzki's ring   . Shingles   . 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 Trapezius muscle spasm    right side  . Uterovaginal prolapse   . Weight loss     Past Surgical History:  Procedure Laterality Date  . CATARACT EXTRACTION, BILATERAL    . COLONOSCOPY    . CORONARY ANGIOPLASTY WITH STENT PLACEMENT  11/05/2004   RCA DES  . DILATION  AND CURETTAGE OF UTERUS    . LID LESION EXCISION Left 02/14/2018   Procedure: LEFT LID LESION EXCISION;  Surgeon: Clista Bernhardt, MD;  Location: [place];  Service: Ophthalmology;  Laterality: Left;  . RECONSTRUCTION OF EYELID Left 02/14/2018   Procedure: TOTAL RECONSTRUCTION OF EYELID;  Surgeon: Clista Bernhardt, MD;  Location: [place];  Service: Ophthalmology;  Laterality: Left;  . SKIN FULL THICKNESS GRAFT Left 02/14/2018   Procedure: SKIN GRAFT FULL THICKNESS LEFT EYE;  Surgeon: Clista Bernhardt, MD;  Location: [facility];  Service: Ophthalmology;  Laterality: Left;  . TONSILECTOMY/ADENOIDECTOMY WITH MYRINGOTOMY    . TUBAL LIGATION      Family History  Problem Relation Age of Onset  . Heart attack Mother  81  . Cancer Father        Colon  . Heart attack Maternal Grandmother 61  . High blood pressure Son   . High Cholesterol Son   . Depression Son   . Depression Daughter   . Depression Daughter   . Depression Son     Social history:  reports that she has never smoked. She has never used smokeless tobacco. She reports previous alcohol use. She reports that she does not use drugs.  Medications:  Prior to Admission medications   Medication Sig Start Date End Date Taking? Authorizing Provider  acetaminophen (TYLENOL) 500 MG tablet Take 1,000 mg by mouth as needed for mild pain.   Yes [provider]  amLODipine (NORVASC) 2.5 MG tablet Take 1 tablet (2.5 mg total) by mouth daily. 07/12/19  Yes Patwardhan, Manish J, MD  Aspirin Buf,CaCarb-MgCarb-MgO, 81 MG TABS Take 81 mg by mouth daily.    Yes [provider]  Coenzyme Q10 100 MG capsule Take 100 mg by mouth daily.    Yes [provider]  Difluprednate 0.05 % EMUL Place 1 drop into the right eye daily.    Yes [provider]  DULoxetine (CYMBALTA) 30 MG capsule Take 30 mg by mouth 2 (two) times daily.    Yes [provider]  ertugliflozin L-PyroglutamicAc (STEGLATRO) 5 MG TABS tablet Take 5 mg by mouth daily.   Yes [provider]  fluticasone (FLONASE) 50 MCG/ACT nasal spray Place 2 sprays into both nostrils daily as needed for allergies or rhinitis.   Yes [provider]  lactose free nutrition (BOOST PLUS) LIQD Take 237 mLs by mouth 3 (three) times daily with meals. 05/30/20 06/29/20 Yes Pokhrel, Laxman, MD  loratadine (CLARITIN) 10 MG tablet Take 10 mg by mouth every other day.   Yes [provider]  megestrol (MEGACE) 400 MG/10ML suspension Take 10 mLs (400 mg total) by mouth daily. 05/30/20 06/29/20 Yes Pokhrel, Laxman, MD  metFORMIN (GLUCOPHAGE-XR) 500 MG 24 hr tablet Take 1,000 mg by mouth 2 (two) times daily.    Yes [provider]  nitroGLYCERIN (NITROSTAT)  0.4 MG SL tablet Place 1 tablet (0.4 mg total) under the tongue every 5 (five) minutes as needed for chest pain. 11/18/19  Yes Patwardhan, Manish J, MD  polyethylene glycol (MIRALAX / GLYCOLAX) 17 g packet Take 17 g by mouth every other day.   Yes [provider]  pravastatin (PRAVACHOL) 20 MG tablet Take 1 tablet (20 mg total) by mouth every evening. 05/06/19  Yes Patwardhan, Manish J, MD  Propylene Glycol 0.6 % SOLN Place 1 drop into both eyes as needed (dry eyes).   Yes [provider]  protein supplement (RESOURCE BENEPROTEIN) 6 g POWD Take  1 Scoop (6 g total) by mouth 3 (three) times daily with meals. 05/30/20 06/29/20 Yes Pokhrel, Corrie Mckusick, MD      Allergies  Allergen Reactions  . Avelox [Moxifloxacin Hcl In Nacl] Anaphylaxis, Nausea Only and Other (See Comments)    Throat and tongue swelling  (Critical)  . Altace [Ramipril] Other (See Comments)    Tremors and increased tinnitus  . Hctz [Hydrochlorothiazide] Other (See Comments)    Palpitations and tremors  . Lisinopril Other (See Comments)    Headaches  . Losartan Other (See Comments)    Palpitations and tremors  . Statins Other (See Comments)    Increased blood pressure  . Sulfa Antibiotics Rash    ROS:  Out of a complete 14 system review of symptoms, the patient complains only of the following symptoms, and all other reviewed systems are negative.  Weight loss Difficulty swallowing, speech problems Walking difficulty  Blood pressure 135/79, pulse (!) 116, height 5\' 2"  (1.575 m), weight 81 lb 8 oz (37 kg).  Physical Exam  General: The patient is alert and cooperative at the time of the examination.  Eyes: Pupils are equal, round, and reactive to light. Discs are flat bilaterally.  Neck: The neck is supple, no carotid bruits are noted.  Respiratory: The respiratory examination is clear.  Cardiovascular: The cardiovascular examination reveals a regular rate and rhythm, no obvious murmurs or rubs are  noted.  Skin: Extremities are without significant edema.  Neurologic Exam  Mental status: The patient is alert and oriented x 3 at the time of the examination. The patient has apparent normal recent and remote memory, with an apparently normal attention span and concentration ability.  Cranial nerves: Facial symmetry is present. There is good sensation of the face to pinprick and soft touch bilaterally. The strength of the facial muscles and the muscles to head turning and shoulder shrug are normal bilaterally. Speech is dysarthric, not aphasic. Extraocular movements are full. Visual fields are full. The tongue is midline, and the patient has symmetric elevation of the soft palate. No obvious hearing deficits are noted.  The patient has decreased range of movement and speed of movement of the tongue.  Motor: The motor testing reveals 5 over 5 strength of the upper extremities, with exception of some weakness of the intrinsic muscles of the hands bilaterally.  With the lower extremities, the patient has 4/5 strength with hip flexion on the left, normal on the right, good strength with knee flexion and extension but there are bilateral foot drop, more prominent in the left than the right.  Good symmetric motor tone is noted throughout.  Sensory: Sensory testing is intact to pinprick, soft touch, vibration sensation, and position sense on all 4 extremities, with exception some decrease in position sense and vibration sensation in both feet.  no evidence of extinction is noted.  Coordination: Cerebellar testing reveals good finger-nose-finger and heel-to-shin bilaterally.  Gait and station: Gait is somewhat wide-based, steppage gait is seen with the left leg, the patient can walk with assistance.  She is unsteady with Romberg.  Reflexes: Deep tendon reflexes are notable for elevated reflexes in the arms and legs bilaterally with exception of the ankle jerk reflexes are depressed. Toes are downgoing  bilaterally.   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  She has a dysarthria with decreased mobility of the tongue, she  has some weakness of the hands and with the lower extremities and she has elevated reflexes.  The patient will be sent for blood work to exclude the possibility of pharyngeal onset myasthenia gravis.  She will have nerve conduction studies on one arm and 1 leg, and EMG study on one arm and 1 leg including the tongue.  She will follow-up otherwise in 3 months.  If ALS is confirmed, we will initiate treatment with Rilutek.  Jill Alexanders MD 06/02/2020 3:28 PM  [facility] [address] [place], [place] [address]  Phone [phone] Fax [phone]

## 2020-06-03 ENCOUNTER — Other Ambulatory Visit: Payer: Self-pay | Admitting: *Deleted

## 2020-06-03 DIAGNOSIS — N950 Postmenopausal bleeding: Secondary | ICD-10-CM | POA: Insufficient documentation

## 2020-06-03 DIAGNOSIS — Z800 Family history of malignant neoplasm of digestive organs: Secondary | ICD-10-CM | POA: Insufficient documentation

## 2020-06-03 DIAGNOSIS — I252 Old myocardial infarction: Secondary | ICD-10-CM | POA: Insufficient documentation

## 2020-06-03 NOTE — Patient Outreach (Signed)
[place] [facility]) Care Management  06/03/2020  [name] Wright [birth year] [id number]   [facility] outreach for EMMI-general discharge,  RED ON EMMI ALERT Day #   1        Date: Monday 06/01/20 1313 Red Alert Reason: Read discharge papers? No Unfilled prescriptions? Yes  Insurance: Tricare/Tricare for life [facility] admissions x 1  ED visits x 1 in the last 6 months   Transition of care services noted to be completed by primary care MD office staff- [place] Transition of Care will be completed by primary care provider office who will refer to [facility] care management if needed.  Outreach attempt # 1 Patient is able to verify HIPAA identifiers-name and date of birth [place] Management RN reviewed and addressed red alert with patient  Consent: [facility] RN CM reviewed [facility] services with patient. Patient gave verbal consent for services [facility] telephonic RN CM.  Advised patient that there will be further automated EMMI- post discharge calls to assess how the patient is doing following the recent hospitalization Advised the patient that another call may be received from a nurse if any of their responses were abnormal. Patient voiced understanding and was appreciative of f/u call.  EMMI Pt states she is not feeling well and had a bad morning so far.[name] Hoarseness of voice noted.  After verifying HIPAA and confirming she does have her discharge papers, pt requested to conclude the call as she "needed to go lie down" [facility] RN CM offered to assist but offer denied Prefers a return call at a later time  Plan: Patient requests another follow up outreach at this time [facility] RN CM will follow up with patient within the next 4-7 business days  [name] L. Lavina Hamman, RN, BSN, [place] Coordinator Office number [phone] Mobile number [phone]  Main [facility] number [phone] Fax number [phone]

## 2020-06-04 ENCOUNTER — Ambulatory Visit: Payer: Self-pay | Admitting: *Deleted

## 2020-06-08 ENCOUNTER — Encounter: Payer: Self-pay | Admitting: Family

## 2020-06-08 LAB — SEDIMENTATION RATE: Sed Rate: 2 mm/hr (ref 0–40)

## 2020-06-08 LAB — ANTI-HU, ANTI-RI, ANTI-YO IFA
Anti-Hu Ab: NEGATIVE
Anti-Ri Ab: NEGATIVE
Anti-Yo Ab: NEGATIVE

## 2020-06-08 LAB — ACETYLCHOLINE RECEPTOR, BINDING: AChR Binding Ab, Serum: <0.03 nmol/L (ref 0.00–0.24)

## 2020-06-08 LAB — VITAMIN B12: Vitamin B-12: 1131 pg/mL (ref 232–1245)

## 2020-06-08 LAB — TSH: TSH: 1.84 u[IU]/mL (ref 0.450–4.500)

## 2020-06-08 LAB — COPPER, SERUM: Copper: 111 ug/dL (ref 80–158)

## 2020-06-10 ENCOUNTER — Other Ambulatory Visit: Payer: Self-pay

## 2020-06-10 ENCOUNTER — Other Ambulatory Visit: Payer: Self-pay | Admitting: *Deleted

## 2020-06-10 NOTE — Patient Outreach (Addendum)
[facility]) Care Management  06/10/2020  [name] Wright 23-Dec-1940 [id number]    [facility] unsuccessful outreach for EMMI-general discharge referred patient RED ON EMMI ALERT Day #   1        Date: Monday 06/01/20 1313 Red Alert Reason: Read discharge papers? No Unfilled prescriptions? Yes  Insurance: Tricare/Tricare for life [facility] admissions x 1  ED visits x 1 in the last 6 months   Transition of care services noted to be completed by primary care MD office staff- [place] Transition of Care will be completed by primary care provider office who will refer to [facility] care management if needed.  Outreach attempt # 2 Outreach to patient on 06/03/20 and she reported not feeling well  She concluded the call as [facility] RN CM was offering to assist  Outreach attempt to the home number  No answer. [facility] RN CM left HIPAA [facility] Portability and Accountability Act) compliant voicemail message along with CM's contact info.   Plan: [facility] RN CM scheduled this patient for another call attempt within 4-7 business days [facility] RN CM sent a [facility] unsuccessful outreach letter   [name], RN, BSN, CCM [facility] Telephonic Care Management Care Coordinator Office number [phone] Mobile number [phone]  Main [facility] number [phone] Fax number [phone]

## 2020-06-15 ENCOUNTER — Emergency Department (HOSPITAL_COMMUNITY): Payer: Medicare Other

## 2020-06-15 ENCOUNTER — Inpatient Hospital Stay (HOSPITAL_COMMUNITY)
Admission: EM | Admit: 2020-06-15 | Discharge: 2020-07-14 | DRG: 056 | Disposition: E | Payer: Medicare Other | Attending: Internal Medicine | Admitting: Internal Medicine

## 2020-06-15 ENCOUNTER — Other Ambulatory Visit: Payer: Self-pay

## 2020-06-15 DIAGNOSIS — E870 Hyperosmolality and hypernatremia: Secondary | ICD-10-CM | POA: Diagnosis present

## 2020-06-15 DIAGNOSIS — R1310 Dysphagia, unspecified: Secondary | ICD-10-CM

## 2020-06-15 DIAGNOSIS — R531 Weakness: Secondary | ICD-10-CM | POA: Diagnosis not present

## 2020-06-15 DIAGNOSIS — G9341 Metabolic encephalopathy: Secondary | ICD-10-CM | POA: Diagnosis not present

## 2020-06-15 DIAGNOSIS — Z86008 Personal history of in-situ neoplasm of other site: Secondary | ICD-10-CM | POA: Diagnosis not present

## 2020-06-15 DIAGNOSIS — N179 Acute kidney failure, unspecified: Secondary | ICD-10-CM | POA: Diagnosis present

## 2020-06-15 DIAGNOSIS — R000 Tachycardia, unspecified: Secondary | ICD-10-CM | POA: Diagnosis not present

## 2020-06-15 DIAGNOSIS — Z85828 Personal history of other malignant neoplasm of skin: Secondary | ICD-10-CM

## 2020-06-15 DIAGNOSIS — Z66 Do not resuscitate: Secondary | ICD-10-CM | POA: Diagnosis present

## 2020-06-15 DIAGNOSIS — G1221 Amyotrophic lateral sclerosis: Secondary | ICD-10-CM | POA: Diagnosis present

## 2020-06-15 DIAGNOSIS — Z7984 Long term (current) use of oral hypoglycemic drugs: Secondary | ICD-10-CM

## 2020-06-15 DIAGNOSIS — Z681 Body mass index (BMI) 19 or less, adult: Secondary | ICD-10-CM

## 2020-06-15 DIAGNOSIS — Z515 Encounter for palliative care: Secondary | ICD-10-CM | POA: Diagnosis not present

## 2020-06-15 DIAGNOSIS — G250 Essential tremor: Secondary | ICD-10-CM | POA: Diagnosis present

## 2020-06-15 DIAGNOSIS — R54 Age-related physical debility: Secondary | ICD-10-CM | POA: Diagnosis present

## 2020-06-15 DIAGNOSIS — Z7189 Other specified counseling: Secondary | ICD-10-CM | POA: Diagnosis not present

## 2020-06-15 DIAGNOSIS — Z955 Presence of coronary angioplasty implant and graft: Secondary | ICD-10-CM

## 2020-06-15 DIAGNOSIS — I10 Essential (primary) hypertension: Secondary | ICD-10-CM | POA: Diagnosis present

## 2020-06-15 DIAGNOSIS — I252 Old myocardial infarction: Secondary | ICD-10-CM

## 2020-06-15 DIAGNOSIS — E860 Dehydration: Secondary | ICD-10-CM | POA: Diagnosis present

## 2020-06-15 DIAGNOSIS — R1312 Dysphagia, oropharyngeal phase: Secondary | ICD-10-CM

## 2020-06-15 DIAGNOSIS — Z7982 Long term (current) use of aspirin: Secondary | ICD-10-CM

## 2020-06-15 DIAGNOSIS — R6510 Systemic inflammatory response syndrome (SIRS) of non-infectious origin without acute organ dysfunction: Secondary | ICD-10-CM | POA: Diagnosis present

## 2020-06-15 DIAGNOSIS — G1229 Other motor neuron disease: Secondary | ICD-10-CM | POA: Diagnosis present

## 2020-06-15 DIAGNOSIS — Z8673 Personal history of transient ischemic attack (TIA), and cerebral infarction without residual deficits: Secondary | ICD-10-CM

## 2020-06-15 DIAGNOSIS — Z86007 Personal history of in-situ neoplasm of skin: Secondary | ICD-10-CM | POA: Diagnosis not present

## 2020-06-15 DIAGNOSIS — E782 Mixed hyperlipidemia: Secondary | ICD-10-CM | POA: Diagnosis present

## 2020-06-15 DIAGNOSIS — Z20822 Contact with and (suspected) exposure to covid-19: Secondary | ICD-10-CM | POA: Diagnosis present

## 2020-06-15 DIAGNOSIS — J9601 Acute respiratory failure with hypoxia: Secondary | ICD-10-CM | POA: Diagnosis present

## 2020-06-15 DIAGNOSIS — Z9841 Cataract extraction status, right eye: Secondary | ICD-10-CM

## 2020-06-15 DIAGNOSIS — Z9842 Cataract extraction status, left eye: Secondary | ICD-10-CM

## 2020-06-15 DIAGNOSIS — E43 Unspecified severe protein-calorie malnutrition: Secondary | ICD-10-CM | POA: Diagnosis present

## 2020-06-15 DIAGNOSIS — I2510 Atherosclerotic heart disease of native coronary artery without angina pectoris: Secondary | ICD-10-CM | POA: Diagnosis present

## 2020-06-15 DIAGNOSIS — Z79899 Other long term (current) drug therapy: Secondary | ICD-10-CM

## 2020-06-15 DIAGNOSIS — R0902 Hypoxemia: Secondary | ICD-10-CM | POA: Diagnosis not present

## 2020-06-15 DIAGNOSIS — I700 Atherosclerosis of aorta: Secondary | ICD-10-CM | POA: Diagnosis present

## 2020-06-15 DIAGNOSIS — T68XXXA Hypothermia, initial encounter: Secondary | ICD-10-CM | POA: Diagnosis not present

## 2020-06-15 DIAGNOSIS — Z8249 Family history of ischemic heart disease and other diseases of the circulatory system: Secondary | ICD-10-CM | POA: Diagnosis not present

## 2020-06-15 DIAGNOSIS — R627 Adult failure to thrive: Secondary | ICD-10-CM | POA: Diagnosis present

## 2020-06-15 DIAGNOSIS — E1110 Type 2 diabetes mellitus with ketoacidosis without coma: Secondary | ICD-10-CM | POA: Diagnosis present

## 2020-06-15 DIAGNOSIS — K219 Gastro-esophageal reflux disease without esophagitis: Secondary | ICD-10-CM | POA: Diagnosis present

## 2020-06-15 DIAGNOSIS — R4182 Altered mental status, unspecified: Secondary | ICD-10-CM

## 2020-06-15 DIAGNOSIS — X58XXXA Exposure to other specified factors, initial encounter: Secondary | ICD-10-CM | POA: Diagnosis present

## 2020-06-15 DIAGNOSIS — E785 Hyperlipidemia, unspecified: Secondary | ICD-10-CM | POA: Diagnosis present

## 2020-06-15 DIAGNOSIS — R471 Dysarthria and anarthria: Secondary | ICD-10-CM | POA: Diagnosis present

## 2020-06-15 DIAGNOSIS — F32A Depression, unspecified: Secondary | ICD-10-CM | POA: Diagnosis present

## 2020-06-15 DIAGNOSIS — R64 Cachexia: Secondary | ICD-10-CM | POA: Diagnosis present

## 2020-06-15 DIAGNOSIS — E1165 Type 2 diabetes mellitus with hyperglycemia: Secondary | ICD-10-CM | POA: Diagnosis not present

## 2020-06-15 DIAGNOSIS — I499 Cardiac arrhythmia, unspecified: Secondary | ICD-10-CM | POA: Diagnosis not present

## 2020-06-15 DIAGNOSIS — E1169 Type 2 diabetes mellitus with other specified complication: Secondary | ICD-10-CM | POA: Diagnosis present

## 2020-06-15 DIAGNOSIS — A419 Sepsis, unspecified organism: Secondary | ICD-10-CM | POA: Diagnosis present

## 2020-06-15 DIAGNOSIS — R404 Transient alteration of awareness: Secondary | ICD-10-CM | POA: Diagnosis not present

## 2020-06-15 LAB — BASIC METABOLIC PANEL
Anion gap: 22 — ABNORMAL HIGH (ref 5–15)
Anion gap: 16 — ABNORMAL HIGH (ref 5–15)
Anion gap: 9 (ref 5–15)
BUN: 32 mg/dL — ABNORMAL HIGH (ref 8–23)
BUN: 29 mg/dL — ABNORMAL HIGH (ref 8–23)
BUN: 26 mg/dL — ABNORMAL HIGH (ref 8–23)
CO2: 12 mmol/L — ABNORMAL LOW (ref 22–32)
CO2: 15 mmol/L — ABNORMAL LOW (ref 22–32)
CO2: 20 mmol/L — ABNORMAL LOW (ref 22–32)
Calcium: 9.1 mg/dL (ref 8.9–10.3)
Calcium: 9.4 mg/dL (ref 8.9–10.3)
Calcium: 9.6 mg/dL (ref 8.9–10.3)
Chloride: 121 mmol/L — ABNORMAL HIGH (ref 98–111)
Chloride: 124 mmol/L — ABNORMAL HIGH (ref 98–111)
Chloride: 127 mmol/L — ABNORMAL HIGH (ref 98–111)
Creatinine, Ser: 1.09 mg/dL — ABNORMAL HIGH (ref 0.44–1.00)
Creatinine, Ser: 1.07 mg/dL — ABNORMAL HIGH (ref 0.44–1.00)
Creatinine, Ser: 0.79 mg/dL (ref 0.44–1.00)
GFR, Estimated: 51 mL/min — ABNORMAL LOW (ref >60)
GFR, Estimated: 52 mL/min — ABNORMAL LOW (ref >60)
GFR, Estimated: >60 mL/min (ref >60)
Glucose, Bld: 245 mg/dL — ABNORMAL HIGH (ref 70–99)
Glucose, Bld: 228 mg/dL — ABNORMAL HIGH (ref 70–99)
Glucose, Bld: 197 mg/dL — ABNORMAL HIGH (ref 70–99)
Potassium: 3.4 mmol/L — ABNORMAL LOW (ref 3.5–5.1)
Potassium: 3.5 mmol/L (ref 3.5–5.1)
Potassium: 3.1 mmol/L — ABNORMAL LOW (ref 3.5–5.1)
Sodium: 155 mmol/L — ABNORMAL HIGH (ref 135–145)
Sodium: 155 mmol/L — ABNORMAL HIGH (ref 135–145)
Sodium: 156 mmol/L — ABNORMAL HIGH (ref 135–145)

## 2020-06-15 LAB — CBG MONITORING, ED
Glucose-Capillary: 262 mg/dL — ABNORMAL HIGH (ref 70–99)
Glucose-Capillary: 222 mg/dL — ABNORMAL HIGH (ref 70–99)
Glucose-Capillary: 221 mg/dL — ABNORMAL HIGH (ref 70–99)
Glucose-Capillary: 181 mg/dL — ABNORMAL HIGH (ref 70–99)
Glucose-Capillary: 227 mg/dL — ABNORMAL HIGH (ref 70–99)
Glucose-Capillary: 192 mg/dL — ABNORMAL HIGH (ref 70–99)
Glucose-Capillary: 170 mg/dL — ABNORMAL HIGH (ref 70–99)
Glucose-Capillary: 172 mg/dL — ABNORMAL HIGH (ref 70–99)
Glucose-Capillary: 175 mg/dL — ABNORMAL HIGH (ref 70–99)
Glucose-Capillary: 152 mg/dL — ABNORMAL HIGH (ref 70–99)

## 2020-06-15 LAB — COMPREHENSIVE METABOLIC PANEL
ALT: 15 U/L (ref 0–44)
AST: 25 U/L (ref 15–41)
Albumin: 3.5 g/dL (ref 3.5–5.0)
Alkaline Phosphatase: 67 U/L (ref 38–126)
Anion gap: 24 — ABNORMAL HIGH (ref 5–15)
BUN: 32 mg/dL — ABNORMAL HIGH (ref 8–23)
CO2: 11 mmol/L — ABNORMAL LOW (ref 22–32)
Calcium: 9.4 mg/dL (ref 8.9–10.3)
Chloride: 116 mmol/L — ABNORMAL HIGH (ref 98–111)
Creatinine, Ser: 1.24 mg/dL — ABNORMAL HIGH (ref 0.44–1.00)
GFR, Estimated: 44 mL/min — ABNORMAL LOW (ref >60)
Glucose, Bld: 292 mg/dL — ABNORMAL HIGH (ref 70–99)
Potassium: 4 mmol/L (ref 3.5–5.1)
Sodium: 151 mmol/L — ABNORMAL HIGH (ref 135–145)
Total Bilirubin: 1.7 mg/dL — ABNORMAL HIGH (ref 0.3–1.2)
Total Protein: 6.3 g/dL — ABNORMAL LOW (ref 6.5–8.1)

## 2020-06-15 LAB — SEDIMENTATION RATE: Sed Rate: 4 mm/hr (ref 0–22)

## 2020-06-15 LAB — URINALYSIS, ROUTINE W REFLEX MICROSCOPIC
Bilirubin Urine: NEGATIVE
Glucose, UA: >=500 mg/dL — AB
Ketones, ur: 80 mg/dL — AB
Leukocytes,Ua: NEGATIVE
Nitrite: NEGATIVE
Protein, ur: 100 mg/dL — AB
Specific Gravity, Urine: 1.016 (ref 1.005–1.030)
pH: 5 (ref 5.0–8.0)

## 2020-06-15 LAB — MAGNESIUM: Magnesium: 2.2 mg/dL (ref 1.7–2.4)

## 2020-06-15 LAB — TROPONIN I (HIGH SENSITIVITY)
Troponin I (High Sensitivity): 15 ng/L (ref <18)
Troponin I (High Sensitivity): 21 ng/L — ABNORMAL HIGH (ref <18)

## 2020-06-15 LAB — PHOSPHORUS: Phosphorus: 4 mg/dL (ref 2.5–4.6)

## 2020-06-15 LAB — ACETAMINOPHEN LEVEL: Acetaminophen (Tylenol), Serum: <10 ug/mL — ABNORMAL LOW (ref 10–30)

## 2020-06-15 LAB — CBC
HCT: 46.7 % — ABNORMAL HIGH (ref 36.0–46.0)
Hemoglobin: 14.1 g/dL (ref 12.0–15.0)
MCH: 30 pg (ref 26.0–34.0)
MCHC: 30.2 g/dL (ref 30.0–36.0)
MCV: 99.4 fL (ref 80.0–100.0)
Platelets: 577 10*3/uL — ABNORMAL HIGH (ref 150–400)
RBC: 4.7 MIL/uL (ref 3.87–5.11)
RDW: 13.2 % (ref 11.5–15.5)
WBC: 19.7 10*3/uL — ABNORMAL HIGH (ref 4.0–10.5)
nRBC: 0 % (ref 0.0–0.2)

## 2020-06-15 LAB — BETA-HYDROXYBUTYRIC ACID
Beta-Hydroxybutyric Acid: 4.79 mmol/L — ABNORMAL HIGH (ref 0.05–0.27)
Beta-Hydroxybutyric Acid: 1.61 mmol/L — ABNORMAL HIGH (ref 0.05–0.27)

## 2020-06-15 LAB — RESP PANEL BY RT-PCR (FLU A&B, COVID) ARPGX2
Influenza A by PCR: NEGATIVE
Influenza B by PCR: NEGATIVE
SARS Coronavirus 2 by RT PCR: NEGATIVE

## 2020-06-15 LAB — C-REACTIVE PROTEIN: CRP: 4.5 mg/dL — ABNORMAL HIGH (ref <1.0)

## 2020-06-15 LAB — LACTIC ACID, PLASMA
Lactic Acid, Venous: 2.3 mmol/L (ref 0.5–1.9)
Lactic Acid, Venous: 1.5 mmol/L (ref 0.5–1.9)

## 2020-06-15 LAB — RAPID URINE DRUG SCREEN, HOSP PERFORMED
Amphetamines: NOT DETECTED
Barbiturates: NOT DETECTED
Benzodiazepines: NOT DETECTED
Cocaine: NOT DETECTED
Opiates: NOT DETECTED
Tetrahydrocannabinol: NOT DETECTED

## 2020-06-15 LAB — SALICYLATE LEVEL: Salicylate Lvl: <7 mg/dL — ABNORMAL LOW (ref 7.0–30.0)

## 2020-06-15 MED ORDER — LACTATED RINGERS IV SOLN: INTRAVENOUS | Status: DC

## 2020-06-15 MED ORDER — HEPARIN SODIUM (PORCINE) 5000 UNIT/ML IJ SOLN
5000.0000 [IU] | Freq: Three times a day (TID) | INTRAMUSCULAR | Status: DC
  Administered 2020-06-15 – 2020-06-16 (×3): 5000 [IU] via SUBCUTANEOUS
  Filled 2020-06-15 (×3): qty 1

## 2020-06-15 MED ORDER — SODIUM CHLORIDE 0.9 % IV BOLUS
1000.0000 mL | Freq: Once | INTRAVENOUS | Status: AC
  Administered 2020-06-15: 1000 mL via INTRAVENOUS

## 2020-06-15 MED ORDER — POTASSIUM CHLORIDE 10 MEQ/100ML IV SOLN
10.0000 meq | INTRAVENOUS | Status: AC
  Administered 2020-06-15 (×2): 10 meq via INTRAVENOUS
  Filled 2020-06-15 (×2): qty 100

## 2020-06-15 MED ORDER — POTASSIUM CHLORIDE IN NACL 20-0.45 MEQ/L-% IV SOLN
INTRAVENOUS | Status: AC
  Filled 2020-06-15 (×2): qty 1000

## 2020-06-15 MED ORDER — ACETAMINOPHEN 650 MG RE SUPP: 325.0000 mg | RECTAL | Status: DC | PRN

## 2020-06-15 MED ORDER — DEXTROSE IN LACTATED RINGERS 5 % IV SOLN: INTRAVENOUS | Status: DC

## 2020-06-15 MED ORDER — DEXTROSE 50 % IV SOLN: 0.0000 mL | INTRAVENOUS | Status: DC | PRN

## 2020-06-15 MED ORDER — SODIUM CHLORIDE 0.9 % IV SOLN
1.0000 g | Freq: Once | INTRAVENOUS | Status: AC
  Administered 2020-06-15: 1 g via INTRAVENOUS
  Filled 2020-06-15: qty 10

## 2020-06-15 MED ORDER — INSULIN ASPART 100 UNIT/ML ~~LOC~~ SOLN
0.0000 [IU] | SUBCUTANEOUS | Status: DC
  Administered 2020-06-15: 2 [IU] via SUBCUTANEOUS
  Administered 2020-06-16: 1 [IU] via SUBCUTANEOUS

## 2020-06-15 MED ORDER — INSULIN REGULAR(HUMAN) IN NACL 100-0.9 UT/100ML-% IV SOLN
INTRAVENOUS | Status: DC
  Administered 2020-06-15: 3.2 [IU]/h via INTRAVENOUS
  Filled 2020-06-15: qty 100

## 2020-06-15 NOTE — ED Notes (Signed)
Assumed care. Patient resting on stretcher in poc. Cardiac, bp, and pulse ox monitoring in place. Admitting at bedside for assessment. Family at bedside. Call bell in reach. Will continue to monitor.

## 2020-06-15 NOTE — H&P (Addendum)
History and Physical    [name] Wright KZS:[id number] DOB: 11-27-1940 DOA: 07/07/2020  Referring MD/NP/PA: Daryll Drown PCP: Yvonna Alanis, NP  Patient coming from: Home(lives with son) via EMS Consultants: Dr. Berry-cardiology Dr. Jannifer Franklin- neurology   Chief Complaint: Altered mental status  I have personally briefly reviewed patient's old medical records in [place]   HPI: [name] Wright is a 80 y.o. female with medical history significant of hypertension, CAD s/p stenting, diabetes mellitus type 2, essential tremor, and dysphagia found to be acutely altered this morning by her son who is a Proofreader here at [employer].  Patient went to sleep around 6:45 PM last night and around 8 AM this morning he found her laying in the bed staring at the ceiling not initially responding to verbal commands. Eventually, he was able to get her to follow some commands, but she still was not able to talk and he called EMS.  Over the last year, the patient has had a progressive decline.  She has lost about 50 pounds with increased difficulty speaking and swallowing.  Last hospitalized from 12/14-12/18 for failure to thrive and dysphasia.  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  Neurology had been consulted.  Work-up included ESR , protein electrophoresis immunofixation, and ANA were either negative or within normal limits.  She was referred to Dr. Jannifer Franklin of neurology and had a follow-up appointment on 12/21 where TSH, acetylcholine receptor binding antibodies, copper, and vitamin B12 levels were all noted to be within normal limits.  There was concern for ALS with plans for EMG studies.  Over the last 3 days or so patient had not been eating much of anything.  Associated symptoms included energy, shortness of breath with any kind of exertion, urinary incontinence, and constipation.  Son reports that he has had talks with this mother who states that she would not want  to be intubated to be kept alive on a ventilator.  Son adds that there should be no CPR done if her heart were to stop.  I restated that patient is to be a limited code with no CPR and no intubation to which son agreed.   ED Course: Upon admission into the emergency department patient was seen temperature of 93.5 F, pulse 100-1 13, respiration 23-28, and blood pressures currently maintained.  Labs significant for WBC 19.7, platelets 577, sodium 151, chloride 116, CO2 11, BUN 32, creatinine 1.24, glucose 292, anion gap 24, total bilirubin 1.7, and lactic acid 2.3.  COVID-19 and influenza screening were both negative.  Chest x-ray showed no acute abnormalities.  Urinalysis positive for ketones and glucose.  Given 1 L normal saline IV fluids and started on Rocephin IV.  TRH called to admit.  Review of Systems  Unable to perform ROS: Mental status change  Constitutional: Positive for malaise/fatigue. Negative for fever.  Respiratory: Positive for shortness of breath.   Gastrointestinal: Positive for constipation.  Skin: Negative for itching.  Neurological: Positive for weakness.    Past Medical History:  Diagnosis Date  . Aortic atherosclerosis ([place])   . BCC (basal cell carcinoma of skin) 06/24/1993   Left Shoulder  . 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 Cancer ([place])    skin and eye  . Constipation   . Coronary artery disease    NUCLEAR STRESS TEST, 09/30/2009 - EKG negative for ischemia  . Depression   . Diabetes  mellitus without complication ([place])    Treated by diet  . Essential tremor    hands, sometimes legs   . Family history of coronary artery disease    Mother died 18  . GERD (gastroesophageal reflux disease)   . H/O subconjunctival hemorrhage   . Heart attack ([place])   . History of blood transfusion   . History of failure to thrive syndrome   . History of weakness   . Hyperlipidemia   . Hypertension   . 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 Lumbar  back pain with radiculopathy affecting lower extremity   . Myocardial infarction ([place]) 2006  . Neck pain   . 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 Osteopenia   . Presence of pessary   . Retroperitoneal hematoma    S/P  . Schatzki's ring   . Shingles   . 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 Trapezius muscle spasm    right side  . Uterovaginal prolapse   . Weight loss     Past Surgical History:  Procedure Laterality Date  . CATARACT EXTRACTION, BILATERAL    . COLONOSCOPY    . CORONARY ANGIOPLASTY WITH STENT PLACEMENT  11/05/2004   RCA DES  . DILATION AND CURETTAGE OF UTERUS    . LID LESION EXCISION Left 02/14/2018   Procedure: LEFT LID LESION EXCISION;  Surgeon: Clista Bernhardt, MD;  Location: [facility];  Service: Ophthalmology;  Laterality: Left;  . RECONSTRUCTION OF EYELID Left 02/14/2018   Procedure: TOTAL RECONSTRUCTION OF EYELID;  Surgeon: Clista Bernhardt, MD;  Location: [facility];  Service: Ophthalmology;  Laterality: Left;  . SKIN FULL THICKNESS GRAFT Left 02/14/2018   Procedure: SKIN GRAFT FULL THICKNESS LEFT EYE;  Surgeon: Clista Bernhardt, MD;  Location: [facility];  Service: Ophthalmology;  Laterality: Left;  . TONSILECTOMY/ADENOIDECTOMY WITH MYRINGOTOMY    . TUBAL LIGATION       reports that she has never smoked. She has never used smokeless tobacco. She reports previous alcohol use. She reports that she does not use drugs.  Allergies  Allergen Reactions  . Avelox [Moxifloxacin Hcl In Nacl] Anaphylaxis, Nausea Only and Other (See Comments)    Throat and tongue swelling  (Critical)  . Altace [Ramipril] Other (See Comments)    Tremors and increased tinnitus  . Hctz [Hydrochlorothiazide] Other (See Comments)    Palpitations and tremors  . Lisinopril Other (See Comments)    Headaches  . Losartan Other (See Comments)    Palpitations and  tremors  . Statins Other (See Comments)    Increased blood pressure  . Sulfa Antibiotics Rash    Family History  Problem Relation Age of Onset  . Heart attack Mother 44  . Cancer Father        Colon  . Heart attack Maternal Grandmother 61  . High blood pressure Son   . High Cholesterol Son   . Depression Son   . Depression Daughter   . Depression Daughter   . Depression Son     Prior to Admission medications   Medication Sig Start Date End Date Taking? Authorizing Provider  acetaminophen (TYLENOL) 500 MG tablet Take 1,000 mg by mouth as needed for mild pain.    [provider]  amLODipine (NORVASC) 2.5 MG tablet Take 1 tablet (2.5 mg total) by mouth daily. 07/12/19  Patwardhan, Manish J, MD  Aspirin Buf,CaCarb-MgCarb-MgO, 81 MG TABS Take 81 mg by mouth daily.     [provider]  Coenzyme Q10 100 MG capsule Take 100 mg by mouth daily.     [provider]  Difluprednate 0.05 % EMUL Place 1 drop into the right eye daily.     [provider]  DULoxetine (CYMBALTA) 30 MG capsule Take 30 mg by mouth 2 (two) times daily.     [provider]  ertugliflozin L-PyroglutamicAc (STEGLATRO) 5 MG TABS tablet Take 5 mg by mouth daily.    [provider]  fluticasone (FLONASE) 50 MCG/ACT nasal spray Place 2 sprays into both nostrils daily as needed for allergies or rhinitis.    [provider]  lactose free nutrition (BOOST PLUS) LIQD Take 237 mLs by mouth 3 (three) times daily with meals. 05/30/20 06/29/20  Pokhrel, Corrie Mckusick, MD  loratadine (CLARITIN) 10 MG tablet Take 10 mg by mouth every other day.    [provider]  megestrol (MEGACE) 400 MG/10ML suspension Take 10 mLs (400 mg total) by mouth daily. 05/30/20 06/29/20  Pokhrel, Corrie Mckusick, MD  metFORMIN (GLUCOPHAGE-XR) 500 MG 24 hr tablet Take 1,000 mg by mouth 2 (two) times daily.     [provider]  nitroGLYCERIN (NITROSTAT) 0.4 MG SL tablet Place 1 tablet (0.4 mg  total) under the tongue every 5 (five) minutes as needed for chest pain. 11/18/19   Patwardhan, Manish J, MD  polyethylene glycol (MIRALAX / GLYCOLAX) 17 g packet Take 17 g by mouth every other day.    [provider]  pravastatin (PRAVACHOL) 20 MG tablet Take 1 tablet (20 mg total) by mouth every evening. 05/06/19   Patwardhan, [name], MD  Propylene Glycol 0.6 % SOLN Place 1 drop into both eyes as needed (dry eyes).    [provider]  protein supplement (RESOURCE BENEPROTEIN) 6 g POWD Take 1 Scoop (6 g total) by mouth 3 (three) times daily with meals. 05/30/20 06/29/20  Flora Lipps, MD    Physical Exam:  Constitutional: Frail and cachectic elderly female who appears very ill Vitals:   06/20/2020 1030 06/19/2020 1130 07/05/2020 1245  BP: (!) 123/51 126/66 (!) 133/54  Pulse: 100 (!) 109 (!) 113  Resp: (!) 23 (!) 28 (!) 25  Temp: (!) 93.5 F (34.2 C) (!) 94.7 F (34.8 C) (!) 95.5 F (35.3 C)  SpO2: 96% 97% 98%   Eyes: PERRL, lids and conjunctivae normal ENMT: Mucous membranes are dry. Posterior pharynx clear of any exudate or lesions. Neck: normal, supple, no masses, no thyromegaly Respiratory: Tachypneic with decreased aeration note significant crackles or wheezes appreciated. Cardiovascular: Tachycardic, no murmurs / rubs / gallops. No extremity edema. 2+ pedal pulses. No carotid bruits.  Abdomen: no tenderness, no masses palpated. No hepatosplenomegaly. Bowel sounds positive.  Musculoskeletal: no clubbing / cyanosis.  Muscle wasting is in all extremities. Skin: Poor skin turgor.  Neurologic: CN 2-12 grossly intact. Sensation intact, DTR normal. Strength 5/5 in all 4.  Psychiatric: Lethargic, but able to be awakened and tried to say son's name which is improvement from previous    Labs on Admission: I have personally reviewed following labs and imaging studies  CBC: Recent Labs  Lab 06/14/2020 1018  WBC 19.7*  HGB 14.1  HCT 46.7*  MCV 99.4  PLT 577*   Basic  Metabolic Panel: Recent Labs  Lab 07/09/2020 1018  NA 151*  K 4.0  CL 116*  CO2 11*  GLUCOSE  292*  BUN 32*  CREATININE 1.24*  CALCIUM 9.4   GFR: Estimated Creatinine Clearance: 20.8 mL/min (A) (by C-G formula based on SCr of 1.24 mg/dL (H)). Liver Function Tests: Recent Labs  Lab 07/01/2020 1018  AST 25  ALT 15  ALKPHOS 67  BILITOT 1.7*  PROT 6.3*  ALBUMIN 3.5   No results for input(s): LIPASE, AMYLASE in the last 168 hours. No results for input(s): AMMONIA in the last 168 hours. Coagulation Profile: No results for input(s): INR, PROTIME in the last 168 hours. Cardiac Enzymes: No results for input(s): CKTOTAL, CKMB, CKMBINDEX, TROPONINI in the last 168 hours. BNP (last 3 results) No results for input(s): PROBNP in the last 8760 hours. HbA1C: No results for input(s): HGBA1C in the last 72 hours. CBG: Recent Labs  Lab 06/23/2020 1018  GLUCAP 262*   Lipid Profile: No results for input(s): CHOL, HDL, LDLCALC, TRIG, CHOLHDL, LDLDIRECT in the last 72 hours. Thyroid Function Tests: No results for input(s): TSH, T4TOTAL, FREET4, T3FREE, THYROIDAB in the last 72 hours. Anemia Panel: No results for input(s): VITAMINB12, FOLATE, FERRITIN, TIBC, IRON, RETICCTPCT in the last 72 hours. Urine analysis:    Component Value Date/Time   COLORURINE YELLOW 06/20/2020 1140   APPEARANCEUR HAZY (A) 06/29/2020 1140   LABSPEC 1.016 06/23/2020 1140   PHURINE 5.0 06/19/2020 1140   GLUCOSEU >=500 (A) 06/27/2020 1140   HGBUR SMALL (A) 06/23/2020 1140   BILIRUBINUR NEGATIVE 06/24/2020 1140   KETONESUR 80 (A) 07/08/2020 1140   PROTEINUR 100 (A) 07/09/2020 1140   UROBILINOGEN 0.2 07/28/2013 1153   NITRITE NEGATIVE 07/04/2020 1140   LEUKOCYTESUR NEGATIVE 06/24/2020 1140   Sepsis Labs: Recent Results (from the past 240 hour(s))  Resp Panel by RT-PCR (Flu A&B, Covid) Nasopharyngeal Swab     Status: None   Collection Time: 06/24/2020 10:20 AM   Specimen: Nasopharyngeal Swab; Nasopharyngeal(NP)  swabs in vial transport medium  Result Value Ref Range Status   SARS Coronavirus 2 by RT PCR NEGATIVE NEGATIVE Final    Comment: (NOTE) SARS-CoV-2 target nucleic acids are NOT DETECTED.  The SARS-CoV-2 RNA is generally detectable in upper respiratory specimens during the acute phase of infection. The lowest concentration of SARS-CoV-2 viral copies this assay can detect is 138 copies/mL. A negative result does not preclude SARS-Cov-2 infection and should not be used as the sole basis for treatment or other patient management decisions. A negative result may occur with  improper specimen collection/handling, submission of specimen other than nasopharyngeal swab, presence of viral mutation(s) within the areas targeted by this assay, and inadequate number of viral copies(<138 copies/mL). A negative result must be combined with clinical observations, patient history, and epidemiological information. The expected result is Negative.  Fact Sheet for Patients:  EntrepreneurPulse.com.au  Fact Sheet for Healthcare Providers:  IncredibleEmployment.be  This test is no t yet approved or cleared by the [place] FDA and  has been authorized for detection and/or diagnosis of SARS-CoV-2 by FDA under an Emergency Use Authorization (EUA). This EUA will remain  in effect (meaning this test can be used) for the duration of the COVID-19 declaration under Section 564(b)(1) of the Act, 21 U.S.C.section 360bbb-3(b)(1), unless the authorization is terminated  or revoked sooner.       Influenza A by PCR NEGATIVE NEGATIVE Final   Influenza B by PCR NEGATIVE NEGATIVE Final    Comment: (NOTE) The Xpert Xpress SARS-CoV-2/FLU/RSV plus assay is intended as an aid in the diagnosis of influenza from Nasopharyngeal swab specimens and should  not be used as a sole basis for treatment. Nasal washings and aspirates are unacceptable for Xpert Xpress  SARS-CoV-2/FLU/RSV testing.  Fact Sheet for Patients: EntrepreneurPulse.com.au  Fact Sheet for Healthcare Providers: IncredibleEmployment.be  This test is not yet approved or cleared by the [place] FDA and has been authorized for detection and/or diagnosis of SARS-CoV-2 by FDA under an Emergency Use Authorization (EUA). This EUA will remain in effect (meaning this test can be used) for the duration of the COVID-19 declaration under Section 564(b)(1) of the Act, 21 U.S.C. section 360bbb-3(b)(1), unless the authorization is terminated or revoked.  Performed at [facility], [place] [address], [place], [place] [address]   Culture, blood (routine x 2)     Status: None (Preliminary result)   Collection Time: 07/07/2020 10:25 AM   Specimen: BLOOD RIGHT ARM  Result Value Ref Range Status   Specimen Description BLOOD RIGHT ARM  Final   Special Requests   Final    BOTTLES DRAWN AEROBIC AND ANAEROBIC Blood Culture adequate volume   Culture   Final    NO GROWTH <12 HOURS Performed at [facility], [place] [address], [place], [place] [address]    Report Status PENDING  Incomplete  Culture, blood (routine x 2)     Status: None (Preliminary result)   Collection Time: 06/13/2020 10:37 AM   Specimen: BLOOD RIGHT HAND  Result Value Ref Range Status   Specimen Description BLOOD RIGHT HAND  Final   Special Requests   Final    BOTTLES DRAWN AEROBIC AND ANAEROBIC Blood Culture results may not be optimal due to an inadequate volume of blood received in culture bottles   Culture   Final    NO GROWTH <12 HOURS Performed at [facility], [place] [address], [place], [place] [address]    Report Status PENDING  Incomplete     Radiological Exams on Admission: CT Head Wo Contrast  Result Date: 07/06/2020 CLINICAL DATA:  Mental status change, unknown cause. EXAM: CT HEAD WITHOUT CONTRAST TECHNIQUE: Contiguous axial images were obtained from the base of  the skull through the vertex without intravenous contrast. COMPARISON:  Brain MRI 05/28/2020.  Head CT 06/27/2016. FINDINGS: Brain: Mildly motion degraded exam. Mild cerebral and cerebellar atrophy. Mild ill-defined hypoattenuation within the cerebral white matter is nonspecific, but compatible with chronic small vessel ischemic disease. Known chronic infarct within the right pons. There is no acute intracranial hemorrhage. No demarcated cortical infarct. No extra-axial fluid collection. No evidence of intracranial mass. No midline shift. Vascular: No hyperdense vessel.  Atherosclerotic calcifications. Skull: No calvarial fracture 10 mm exostosis arising from the right frontal calvarium (series 4, image 63). Sinuses/Orbits: Visualized orbits show no acute finding. Mild left ethmoid sinus mucosal thickening. IMPRESSION: No evidence of acute intracranial abnormality. Known chronic infarct within the right pons. Background mild generalized atrophy of the brain and chronic small vessel ischemic disease. 10 mm bony exostosis arising from the right frontal calvarium. Mild left ethmoid sinus mucosal thickening. Electronically Signed   By: Kellie Simmering DO   On: 06/19/2020 11:15   DG Chest Portable 1 View  Result Date: 07/11/2020 CLINICAL DATA:  Weakness EXAM: PORTABLE CHEST 1 VIEW COMPARISON:  05/26/2020 FINDINGS: The heart size and mediastinal contours are within normal limits. Both lungs are clear. The visualized skeletal structures are unremarkable. IMPRESSION: No acute abnormality of the lungs in AP portable projection. Electronically Signed   By: Eddie Candle M.D.   On: 07/11/2020 10:58  EKG: Independently reviewed.  Sinus tachycardia at 101 bpm with  Assessment/Plan SIRS/Sepsis, unknown source: Acute.  Patient present with initial temperature as low as 93.5 F with tachycardia and tachypnea.  Labs significant for WBC 19.7 and initial lactic acid of 2.3.  No clear source appreciated at this time.  Patient  had initially been given Rocephin IV and fluids.  Repeat lactic acid noted to be within normal limits -Admit to a progressive bed -Follow-up blood culture  -ESR and CRP -Warming blanket as needed  DKA, type II: Acute.  Patient presents with glucose elevated up to 292 on admission with CO2 11, and anion gap 24.  Last hemoglobin A1c was 7.7 on 11/92021.  Urinalysis positive for ketones and glucose. -Hyperglycemia order set utilized -Hold Metformin and ertuglifozin -Insulin drip and IV fluids per protocol -Serial BMPs, hemoglobin A1c in a.m.  -Correct electrolytes as needed -Monitoring for AG closure and will transition to subcutaneous insulin once able -Diabetes coordinator consult  Acute metabolic encephalopathy: Patient noted to be acutely altered this morning staring at the ceiling and unable to talk.  CT scan of the brain negative for any acute abnormalities.  MRI during previous hospitalization last month noted a remote infarct of the right pons.  After initial fluids patient appears to be able to speak some.   -Neuro checks -Check salicylate and acetaminophen levels -Check UDS -Recommend continue outpatient follow-up with Dr. Jannifer Franklin for EMG studies and further work-up  Dysphagia: Acute on chronic.  Patient was evaluated by speech during last hospitalization and started on dysphagia 3 diet.She is currently being worked up in the outpatient setting by Dr. Jannifer Franklin of neurology.  So far work-up has been inconclusive as to the likely cause of patient's failure to thrive symptoms and dysarthria, but thought to be possibly related to ALS. -N.p.o. for now -Speech to eval and treat -Continue outpatient follow-up with Dr. Jannifer Franklin of neurology.  May want to discuss current hospitalization for any other recommendation or formally consult neurology if needed.  Acute kidney injury: Patient baseline creatinine previously noted to be 0.4, but presents with creatinine elevated up to 1.24 with BUN 32.  The  elevated BUN to creatinine ratio suggestive of prerenal cause of symptoms. -Strict intake and output -IV fluids as seen above -Continue to monitor kidney function  Hypernatremia: Acute.  Initial sodium noted to be 151.  Patient noted not to be eating and drinking much.  Suspect likely a hypovolemic hypernatremia. -Adjust fluids as needed -Continue to monitor  Elevated troponin: Acute.  Initial high-sensitivity troponin 15, but repeat just mildly elevated at 21.  Patient noted to have some T wave changes that appear to be new, but patient doesn't appear to express any chest pain complaints.  -Continue to monitor  Essential hypertension: Blood pressures currently stable.  Home blood pressure medications include amlodipine 2.5 mg daily -Consider restarting amlodipine when medically appropriate  Failure to thrive: Acute on chronic.  Patient noted not to be eating or drinking over the last 3 days.  Ultimately if patient swallowing and thinks progress son would like to have have further conversations with his mother in regards to care. -Restart nutrient supplements once patient becomes more alert -Palliative care consulted to help with goals of care  DVT prophylaxis: Heparin Code Status: Limited no CPR and no intubation Family Communication: Son updated at this Disposition Plan: To be determined Consults called: none Admission status: Inpatient status, require more than two midnight stay and evaluation of possible infection  Kimyetta Flott A  Mahagony Grieb MD [facility]   If 7PM-7AM, please contact night-coverage   06/24/2020, 1:23 PM

## 2020-06-15 NOTE — ED Triage Notes (Signed)
Pt from home, normally A/O x 4, ambulatory with assistance, was normal last night when she went to bed at 1800. This morning pt woke up, is only groaning, only responsive to painful stimuli. EMS reports she would follow commands and track with her eyes on scene but in triage is unable to follow commands or speak. Pt becoming more lethargic. No DNR has been made, but pt's POA states that he does not want the patient to be resuscitated.

## 2020-06-15 NOTE — ED Notes (Signed)
Hospital bed ordered for patient.

## 2020-06-15 NOTE — Consult Note (Signed)
Consultation Note Date: 06/28/2020   Patient Name: [name] Wright  DOB: 1940/09/02  MRN: [id number]  Age / Sex: 80 y.o., female  PCP: Yvonna Alanis, NP Referring Physician: Norval Morton, MD  Reason for Consultation: Establishing goals of care  HPI/Patient Profile: 80 y.o. female  with past medical history of diabetes mellitus type 2, hypertension, CAD status post stent placement,essential tremor, and dysphagia presented to the emergency department on 06/23/2020 with altered mental status. Per son, patient was normal when she went to bed last night around 18:45 and this morning he found her lying in bed staring at the ceiling and initially not following any commands.  She was hospitalized 12/14--12/18 for failure to thrive and dysphagia. MRI at that time showed a remote lacunar infarct. She was referred to Dr. Jannifer Franklin with neurology and was seen 12/21; there was concern for ALS with plans for EMG studies.  ED Course: Patient hypothermic with temperature 93.5. Labs significant for WBC 19.7, sodium 151, creatinine 1.24, BUN 32, glucose 292, anion gap 24, total bilirubin 1.7, lactic acid 2.3. Chest x-ray negative for acute abnormalities. Urinalysis positive for ketones and glucose. Patient admitted to [facility] for management of SIRS/possible sepsis with unknown source, DKA, acute metabolic encephalopathy, and dysphagia.    Clinical Assessment and Goals of Care: I have reviewed medical records including EPIC notes, labs and imaging, and examined the patient. I met in the ED conference room with son [name] Wright and daughter [name] Wright  to discuss diagnosis, prognosis, [place], EOL wishes, disposition, and options.   I introduced Palliative Medicine as specialized medical care for people living with serious illness. It focuses on providing relief from the symptoms and stress of a serious illness.   We discussed a brief life review of the  patient. She was born and raised in [place], [place]. Her husband was in the [place], so they lived in several other states including [place], [place], and [place]. They divorced sometime prior to him passing away several years ago. They had 5 children together (4 are living): [name] Wright ([facility]), [name] Wright ([place]), [name] Wright ([place]), and [name] Wright.   [name] Wright and [name] Wright have lived with the patient for the past few years. They share that she loves music, and was able to meet her favorite musician Jillyn Ledger. She also loves being around her grandchildren.   As far as functional and nutritional status, there has been a decline over the past year with a significant (50 lb) unintentional weight loss. For the past 6 months, patient has had increasing difficulty with swallowing and eating.   We discussed her current illness and what it means in the larger context of her ongoing co-morbidities. We discuss that she has a possible diagnosis of ALS. She was seen by outpatient neurology (Dr. Jannifer Franklin) 06/02/20 and was been scheduled for EMG studies.      If ALS in confirmed, we discussed that the dysphagia will be progressive and irreversible. We briefly discussed benefits versus burdens of artificial feeding. Emphasized that in many situations, artificial feeding has not been shown  to promote quality of life in patients with dysphagia from a progressive condition. [name] Wright expresses that he is hopeful patient's mental status will improve enough that she can communicate her wishes regarding tube feeding. Patient does have a living will - they are currently trying to locate this document. Discussed that most living wills address artificial feeding.   I attempted to elicit values and goals of care important to the patient. [name] Wright and [name] Wright are very clear they do want her to be in a SNF.     The difference between aggressive medical intervention and comfort care was considered in light of the patient's goals of care. We discussed  the possibility that her current status is her "new baseline". If this is the case, family seems to indicate they would not want to pursue artificial feeding or other aggressive interventions.  Hospice and Palliative Care services outpatient were explained and offered.  Questions and concerns were addressed.  The family was encouraged to call with questions or concerns.   Primary decision maker: [name] Wright (son) [phone] and [name] Wright (daughter) [phone]  [name] Wright is fairly certain he and [name] Wright ar listed as joint HCPOA on patient's advanced directives - they are currently working to locate these documents.   [name] Wright is an Therapist, sports here at [facility]; he works on [employer].   SUMMARY OF RECOMMENDATIONS   - full scope treatment with ongoing discussions - watchful waiting - family is cautiously hopeful for improvement - If patient does not improve, family seems to indicate they would not want aggressive interventions (including artifical feeding) - patient has a living will - family is trying to locate this document and will bring to the hospital - PMT will continue to follow  Code Status/Advance Care Planning:  Limited code: no CPR, no intubation (ok with BiPAP, ACLS medications, defibrillation, or cardioversion)  Symptom Management:   Per primary team  Palliative Prophylaxis:   Turn/reposition, oral care  Additional Recommendations (Limitations, Scope, Preferences):  Full scope treatment   Psycho-social/Spiritual:   Created space and opportunity for family to express thoughts and feelings regarding patient's current medical situation.   Emotional support provided   Prognosis:   Unable to determine  Discharge Planning: to be determined      Primary Diagnoses: Present on Admission: . SIRS (systemic inflammatory response syndrome) (HCC) . DKA, type 2 ([place]) . Acute metabolic encephalopathy . Failure to thrive in adult . Essential hypertension   I have reviewed the medical  record, interviewed the patient and family, and examined the patient. The following aspects are pertinent.  Past Medical History:  Diagnosis Date  . Aortic atherosclerosis ([place])   . BCC (basal cell carcinoma of skin) 06/24/1993   Left Shoulder  . 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 Cancer ([place])    skin and eye  . Constipation   . Coronary artery disease    NUCLEAR STRESS TEST, 09/30/2009 - EKG negative for ischemia  . Depression   . Diabetes mellitus without complication ([place])    Treated by diet  . Essential tremor    hands, sometimes legs   . Family history of coronary artery disease    Mother died 62  . GERD (gastroesophageal reflux disease)   . H/O subconjunctival hemorrhage   . Heart attack ([place])   . History of blood transfusion   . History of failure to thrive syndrome   . History of weakness   . Hyperlipidemia   . Hypertension   . 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 Lumbar back pain with radiculopathy affecting lower extremity   . Myocardial infarction ([place]) 2006  . Neck pain   . 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 Osteopenia   . Presence of pessary   . Retroperitoneal hematoma    S/P  . Schatzki's ring   . Shingles   . 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 Trapezius muscle spasm    right side  . Uterovaginal prolapse   . Weight loss     Family History  Problem Relation Age of Onset  . Heart attack Mother 37  . Cancer Father        Colon  . Heart attack Maternal Grandmother 61  . High blood pressure Son   . High Cholesterol Son   . Depression Son   . Depression Daughter   . Depression Daughter   . Depression Son    Scheduled Meds: . heparin  5,000 Units Subcutaneous Q8H   Continuous Infusions: . dextrose 5% lactated ringers 100 mL/hr at 07/09/2020 1414  . insulin 1.4 Units/hr  (07/06/2020 1638)  . lactated ringers     PRN Meds:.dextrose Medications Prior to Admission:  Prior to Admission medications   Medication Sig Start Date End Date Taking? Authorizing Provider  acetaminophen (TYLENOL) 500 MG tablet Take 1,000 mg by mouth every 8 (eight) hours as needed for mild pain.   Yes [provider]  amLODipine (NORVASC) 2.5 MG tablet Take 1 tablet (2.5 mg total) by mouth daily. 07/12/19  Yes Patwardhan, Manish J, MD  aspirin EC 81 MG tablet Take 81 mg by mouth daily. Swallow whole.   Yes [provider]  Coenzyme Q10 100 MG capsule Take 100 mg by mouth daily.    Yes [provider]  Difluprednate 0.05 % EMUL Place 1 drop into the right eye daily.    Yes [provider]  DULoxetine (CYMBALTA) 30 MG capsule Take 30 mg by mouth daily.   Yes [provider]  ertugliflozin L-PyroglutamicAc (STEGLATRO) 5 MG TABS tablet Take 5 mg by mouth daily.   Yes [provider]  fluticasone (FLONASE) 50 MCG/ACT nasal spray Place 2 sprays into both nostrils daily as needed for allergies or rhinitis.   Yes [provider]  lactose free nutrition (BOOST PLUS) LIQD Take 237 mLs by mouth 3 (three) times daily with meals. 05/30/20 06/29/20 Yes Pokhrel, Laxman, MD  loratadine (CLARITIN) 10 MG tablet Take 10 mg by mouth every other day.   Yes [provider]  megestrol (MEGACE) 400 MG/10ML suspension Take 10 mLs (400 mg total) by mouth daily. 05/30/20 06/29/20 Yes Pokhrel, Laxman, MD  metFORMIN (GLUCOPHAGE-XR) 500 MG 24 hr tablet Take 1,000 mg by mouth 2 (two) times daily.    Yes [provider]  nitroGLYCERIN (NITROSTAT) 0.4 MG SL tablet Place 1 tablet (0.4 mg total) under the tongue every 5 (five) minutes as needed for chest pain. Patient taking differently: Place 0.4 mg under the tongue every 5 (five) minutes as needed for chest pain (max 3 doses). 11/18/19  Yes Patwardhan, Manish J, MD  polyethylene glycol (MIRALAX /  GLYCOLAX) 17 g packet Take 17 g by mouth every other day.   Yes [provider]  pravastatin (PRAVACHOL) 20 MG tablet Take 1 tablet (20 mg total) by mouth every evening. 05/06/19  Yes Patwardhan, Manish J, MD  Propylene Glycol 0.6 % SOLN Place 1 drop into both eyes daily as needed (dry eyes).   Yes [provider]  protein supplement (RESOURCE BENEPROTEIN) 6 g POWD Take 1 Scoop (6 g total) by mouth 3 (three) times daily with meals. Patient taking differently: Take 1 Scoop by mouth daily. 05/30/20 06/29/20 Yes Pokhrel, Corrie Mckusick, MD   Allergies  Allergen Reactions  . Avelox [Moxifloxacin Hcl In Nacl] Anaphylaxis, Nausea Only and Other (See Comments)    Throat and tongue swelling  (Critical)  . Altace [Ramipril] Other (See Comments)    Tremors and increased tinnitus  . Hctz [Hydrochlorothiazide] Other (See Comments)    Palpitations and tremors  . Lisinopril Other (See Comments)    Headaches  . Losartan Other (See Comments)    Palpitations and tremors  . Statins Other (See Comments)    Increased blood pressure  . Sulfa Antibiotics Rash   Review of Systems  Unable to perform ROS: Patient nonverbal    Physical Exam Vitals reviewed.  Constitutional:      General: She is not in acute distress.    Appearance: She is cachectic. She is ill-appearing.  Cardiovascular:     Rate and Rhythm: Tachycardia present.  Pulmonary:     Effort: Pulmonary effort is normal.  Neurological:     Mental Status: She is lethargic.     Motor: Weakness present.     Comments: Moving all extremities Following commands     Vital Signs: BP (!) 119/56   Pulse (!) 110   Temp 98 F (36.7 C)   Resp (!) 23   SpO2 100%  Pain Scale: 0-10   Pain Score: 0-No pain   SpO2: SpO2: 100 % O2 Device:SpO2: 100 % O2 Flow Rate: .   IO: Intake/output summary:   Intake/Output Summary (Last 24 hours) at  1731 Last data filed at 07/11/2020 1519 Gross per 24 hour  Intake 99.35 ml  Output --   Net 99.35 ml      Palliative Assessment/Data: PPS 10%     Time In: 17:56 Time Out: 19:10 Time Total: 74 minutes Greater than 50%  of this time was spent counseling and coordinating care related to the above assessment and plan.  Signed by: Lavena Bullion, NP   Please contact Palliative Medicine Team phone at [phone] for questions and concerns.  For individual provider: See Shea Evans

## 2020-06-15 NOTE — ED Provider Notes (Signed)
[facility] EMERGENCY DEPARTMENT Provider Note   CSN: [id number] Arrival date & time: 07/05/2020  0957     History No chief complaint on file.   [name] Wright is a 80 y.o. female with a past medical history of CAD, diabetes, hypertension presenting to the ED with altered mental status. History is provided by patient's POA and son, [name] over the phone.  States that patient was in her usual state of health yesterday until 6:45 PM when she went to bed.  He does note that she has had progressively worsening generalized weakness, failure to thrive, decreased appetite and activity level for the past few months.  This has worsened in the past 3 days.  For the past week she has had urinary incontinence which is new for her.  Also reports decrease in bowel movement since discharge 2 weeks ago.  He reports up until this morning she was able to ambulate with assistance.  She is able to speak to him and is alert, oriented.  When he saw her this morning, states that she was laying in bed, looking up at the ceiling and not responding to anything he was saying.  He states that she was looking at him but did not have any other movements.  She is currently being followed by neurology outpatient and is being worked up for [place]. Patient currently a full code but son does not want chest compressions done and states that the patient discussed in the past how she does not want to be on a ventilator. He denies any complaints of pain, vomiting, diarrhea.  HPI     Past Medical History:  Diagnosis Date  . Aortic atherosclerosis ([place])   . BCC (basal cell carcinoma of skin) 06/24/1993   Left Shoulder  . 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 Cancer ([place])    skin and eye  . Constipation   . Coronary artery disease    NUCLEAR STRESS TEST, 09/30/2009 - EKG negative for ischemia  . Depression   . Diabetes mellitus without complication ([place])    Treated by diet  . Essential tremor     hands, sometimes legs   . Family history of coronary artery disease    Mother died 48  . GERD (gastroesophageal reflux disease)   . H/O subconjunctival hemorrhage   . Heart attack ([place])   . History of blood transfusion   . History of failure to thrive syndrome   . History of weakness   . Hyperlipidemia   . Hypertension   . 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 Lumbar back pain with radiculopathy affecting lower extremity   . Myocardial infarction ([place]) 2006  . Neck pain   . 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 Osteopenia   . Presence of pessary   . Retroperitoneal hematoma    S/P  . Schatzki's ring   . Shingles   . 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 Trapezius muscle spasm    right side  . Uterovaginal prolapse   . Weight loss     Patient Active Problem List   Diagnosis Date Noted  . Sepsis ([place])   . Family history of malignant neoplasm of colon 06/03/2020  . History of myocardial infarction 06/03/2020  . Postmenopausal bleeding 06/03/2020  . Protein-calorie malnutrition, severe 05/29/2020  .  Dysphagia 05/27/2020  . Failure to thrive in adult   . Hoarseness of voice 04/21/2020  . Weight loss, unintentional 04/21/2020  . Fatigue 04/21/2020  . Frailty syndrome in geriatric patient 04/21/2020  . Chronic low back pain without sciatica 04/21/2020  . Aspirin intolerance 06/27/2019  . 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 Elevated blood pressure reading 05/08/2018  . Depressive disorder 07/19/2017  . Failure to thrive (0-17) 02/19/2017  . Shingles right face and eye 02/19/2017  . Status post coronary artery stent placement 10/20/2015  . ACE inhibitor intolerance 03/26/2015  . Gastroesophageal reflux disease without esophagitis 12/24/2014  . Atherosclerosis of native coronary artery without angina pectoris  06/17/2014  . Essential hypertension 06/17/2014  . Type 2 diabetes mellitus without complications ([place]) 99991111  . Combined hyperlipidemia associated with type 2 diabetes mellitus ([place]) 06/17/2014  . Pessary maintenance 06/17/2014  . Uterovaginal prolapse 06/17/2014  . Dyslipidemia, goal LDL below 70   . Non-insulin treated type 2 diabetes mellitus ([place])   . Osteopenia 06/13/2001    Past Surgical History:  Procedure Laterality Date  . CATARACT EXTRACTION, BILATERAL    . COLONOSCOPY    . CORONARY ANGIOPLASTY WITH STENT PLACEMENT  11/05/2004   RCA DES  . DILATION AND CURETTAGE OF UTERUS    . LID LESION EXCISION Left 02/14/2018   Procedure: LEFT LID LESION EXCISION;  Surgeon: Clista Bernhardt, MD;  Location: [facility];  Service: Ophthalmology;  Laterality: Left;  . RECONSTRUCTION OF EYELID Left 02/14/2018   Procedure: TOTAL RECONSTRUCTION OF EYELID;  Surgeon: Clista Bernhardt, MD;  Location: [facility];  Service: Ophthalmology;  Laterality: Left;  . SKIN FULL THICKNESS GRAFT Left 02/14/2018   Procedure: SKIN GRAFT FULL THICKNESS LEFT EYE;  Surgeon: Clista Bernhardt, MD;  Location: [facility];  Service: Ophthalmology;  Laterality: Left;  . TONSILECTOMY/ADENOIDECTOMY WITH MYRINGOTOMY    . TUBAL LIGATION       OB History   No obstetric history on file.     Family History  Problem Relation Age of Onset  . Heart attack Mother 23  . Cancer Father        Colon  . Heart attack Maternal Grandmother 61  . High blood pressure Son   . High Cholesterol Son   . Depression Son   . Depression Daughter   . Depression Daughter   . Depression Son     Social History   Tobacco Use  . Smoking status: Never Smoker  . Smokeless tobacco: Never Used  Vaping Use  . Vaping Use: Never used  Substance Use Topics  . Alcohol use: Not Currently    Alcohol/week: 0.0 standard drinks  . Drug use: No    Home Medications Prior to Admission medications   Medication Sig Start Date End Date Taking? Authorizing  Provider  acetaminophen (TYLENOL) 500 MG tablet Take 1,000 mg by mouth as needed for mild pain.    [provider]  amLODipine (NORVASC) 2.5 MG tablet Take 1 tablet (2.5 mg total) by mouth daily. 07/12/19   Patwardhan, [name], MD  Aspirin Buf,CaCarb-MgCarb-MgO, 81 MG TABS Take 81 mg by mouth daily.     [provider]  Coenzyme Q10 100 MG capsule Take 100 mg by mouth daily.     [provider]  Difluprednate 0.05 % EMUL Place 1 drop into the right eye daily.     [provider]  DULoxetine (CYMBALTA) 30 MG capsule Take 30 mg by mouth 2 (two) times daily.  [provider]  ertugliflozin L-PyroglutamicAc (STEGLATRO) 5 MG TABS tablet Take 5 mg by mouth daily.    [provider]  fluticasone (FLONASE) 50 MCG/ACT nasal spray Place 2 sprays into both nostrils daily as needed for allergies or rhinitis.    [provider]  lactose free nutrition (BOOST PLUS) LIQD Take 237 mLs by mouth 3 (three) times daily with meals. 05/30/20 06/29/20  Pokhrel, Corrie Mckusick, MD  loratadine (CLARITIN) 10 MG tablet Take 10 mg by mouth every other day.    [provider]  megestrol (MEGACE) 400 MG/10ML suspension Take 10 mLs (400 mg total) by mouth daily. 05/30/20 06/29/20  Pokhrel, Corrie Mckusick, MD  metFORMIN (GLUCOPHAGE-XR) 500 MG 24 hr tablet Take 1,000 mg by mouth 2 (two) times daily.     [provider]  nitroGLYCERIN (NITROSTAT) 0.4 MG SL tablet Place 1 tablet (0.4 mg total) under the tongue every 5 (five) minutes as needed for chest pain. 11/18/19   Patwardhan, Manish J, MD  polyethylene glycol (MIRALAX / GLYCOLAX) 17 g packet Take 17 g by mouth every other day.    [provider]  pravastatin (PRAVACHOL) 20 MG tablet Take 1 tablet (20 mg total) by mouth every evening. 05/06/19   Patwardhan, [name], MD  Propylene Glycol 0.6 % SOLN Place 1 drop into both eyes as needed (dry eyes).    [provider]  protein supplement (RESOURCE  BENEPROTEIN) 6 g POWD Take 1 Scoop (6 g total) by mouth 3 (three) times daily with meals. 05/30/20 06/29/20  Pokhrel, Corrie Mckusick, MD    Allergies    Avelox [moxifloxacin hcl in nacl], Altace [ramipril], Hctz [hydrochlorothiazide], Lisinopril, Losartan, Statins, and Sulfa antibiotics  Review of Systems   Review of Systems  Unable to perform ROS: Mental status change    Physical Exam Updated Vital Signs BP (!) 133/54   Pulse (!) 113   Temp (!) 95.5 F (35.3 C)   Resp (!) 25   SpO2 98%   Physical Exam Vitals and nursing note reviewed.  Constitutional:      General: She is not in acute distress.    Appearance: She is well-developed and well-nourished. She is cachectic.     Comments: Responsive to painful stimuli.  Able to track with eyes.  HENT:     Head: Normocephalic and atraumatic.     Nose: Nose normal.  Eyes:     General: No scleral icterus.       Right eye: No discharge.        Left eye: No discharge.     Extraocular Movements: Extraocular movements intact and EOM normal.     Conjunctiva/sclera: Conjunctivae normal.     Comments: Pupils are slow to respond bilaterally.  Cardiovascular:     Rate and Rhythm: Regular rhythm. Tachycardia present.     Pulses: Intact distal pulses.     Heart sounds: Normal heart sounds. No murmur heard. No friction rub. No gallop.   Pulmonary:     Effort: Pulmonary effort is normal. Tachypnea present. No respiratory distress.     Breath sounds: Normal breath sounds.  Abdominal:     General: Bowel sounds are normal. There is no distension.     Palpations: Abdomen is soft.     Tenderness: There is no abdominal tenderness. There is no guarding.  Musculoskeletal:        General: No edema. Normal range of motion.     Cervical back: Normal range of motion and neck supple.  Skin:  General: Skin is warm and dry.     Findings: No rash.  Neurological:     Mental Status: She is alert.     Motor: No abnormal muscle tone.     Coordination:  Coordination normal.     Comments: Nonverbal.  Able to follow commands such as moving hand. No facial asymmetry noted.  Psychiatric:        Mood and Affect: Mood and affect normal.     ED Results / Procedures / Treatments   Labs (all labs ordered are listed, but only abnormal results are displayed) Labs Reviewed  COMPREHENSIVE METABOLIC PANEL - Abnormal; Notable for the following components:      Result Value   Sodium 151 (*)    Chloride 116 (*)    CO2 11 (*)    Glucose, Bld 292 (*)    BUN 32 (*)    Creatinine, Ser 1.24 (*)    Total Protein 6.3 (*)    Total Bilirubin 1.7 (*)    GFR, Estimated 44 (*)    Anion gap 24 (*)    All other components within normal limits  CBC - Abnormal; Notable for the following components:   WBC 19.7 (*)    HCT 46.7 (*)    Platelets 577 (*)    All other components within normal limits  LACTIC ACID, PLASMA - Abnormal; Notable for the following components:   Lactic Acid, Venous 2.3 (*)    All other components within normal limits  URINALYSIS, ROUTINE W REFLEX MICROSCOPIC - Abnormal; Notable for the following components:   APPearance HAZY (*)    Glucose, UA >=500 (*)    Hgb urine dipstick SMALL (*)    Ketones, ur 80 (*)    Protein, ur 100 (*)    Bacteria, UA RARE (*)    All other components within normal limits  CBG MONITORING, ED - Abnormal; Notable for the following components:   Glucose-Capillary 262 (*)    All other components within normal limits  TROPONIN I (HIGH SENSITIVITY) - Abnormal; Notable for the following components:   Troponin I (High Sensitivity) 21 (*)    All other components within normal limits  CULTURE, BLOOD (ROUTINE X 2)  CULTURE, BLOOD (ROUTINE X 2)  RESP PANEL BY RT-PCR (FLU A&B, COVID) ARPGX2  URINE CULTURE  LACTIC ACID, PLASMA  TROPONIN I (HIGH SENSITIVITY)    EKG EKG Interpretation  Date/Time:  Monday June 15 2020 10:28:54 EST Ventricular Rate:  101 PR Interval:    QRS Duration: 88 QT  Interval:  358 QTC Calculation: 464 R Axis:   82 Text Interpretation: Sinus tachycardia Right atrial enlargement Borderline right axis deviation Repol abnrm suggests ischemia, diffuse leads new t wave changes Confirmed by Isla Pence [phone]) on 07/10/2020 10:31:34 AM Also confirmed by Isla Pence [phone]), editor Hattie Perch [phone])  on 06/23/2020 12:26:21 PM   Radiology CT Head Wo Contrast  Result Date: 06/23/2020 CLINICAL DATA:  Mental status change, unknown cause. EXAM: CT HEAD WITHOUT CONTRAST TECHNIQUE: Contiguous axial images were obtained from the base of the skull through the vertex without intravenous contrast. COMPARISON:  Brain MRI 05/28/2020.  Head CT 06/27/2016. FINDINGS: Brain: Mildly motion degraded exam. Mild cerebral and cerebellar atrophy. Mild ill-defined hypoattenuation within the cerebral white matter is nonspecific, but compatible with chronic small vessel ischemic disease. Known chronic infarct within the right pons. There is no acute intracranial hemorrhage. No demarcated cortical infarct. No extra-axial fluid collection. No evidence of intracranial mass. No midline shift.  Vascular: No hyperdense vessel.  Atherosclerotic calcifications. Skull: No calvarial fracture 10 mm exostosis arising from the right frontal calvarium (series 4, image 63). Sinuses/Orbits: Visualized orbits show no acute finding. Mild left ethmoid sinus mucosal thickening. IMPRESSION: No evidence of acute intracranial abnormality. Known chronic infarct within the right pons. Background mild generalized atrophy of the brain and chronic small vessel ischemic disease. 10 mm bony exostosis arising from the right frontal calvarium. Mild left ethmoid sinus mucosal thickening. Electronically Signed   By: Kellie Simmering DO   On: 07/01/2020 11:15   DG Chest Portable 1 View  Result Date: 06/28/2020 CLINICAL DATA:  Weakness EXAM: PORTABLE CHEST 1 VIEW COMPARISON:  05/26/2020 FINDINGS: The heart size and mediastinal  contours are within normal limits. Both lungs are clear. The visualized skeletal structures are unremarkable. IMPRESSION: No acute abnormality of the lungs in AP portable projection. Electronically Signed   By: Eddie Candle M.D.   On: 07/01/2020 10:58    Procedures .Critical Care Performed by: Delia Heady, PA-C Authorized by: Delia Heady, PA-C   Critical care provider statement:    Critical care time (minutes):  45   Critical care was necessary to treat or prevent imminent or life-threatening deterioration of the following conditions:  Cardiac failure, circulatory failure, respiratory failure and CNS failure or compromise   Critical care was time spent personally by me on the following activities:  Development of treatment plan with patient or surrogate, discussions with consultants, evaluation of patient's response to treatment, examination of patient, obtaining history from patient or surrogate, ordering and performing treatments and interventions, ordering and review of laboratory studies, ordering and review of radiographic studies, pulse oximetry, re-evaluation of patient's condition and review of old charts   I assumed direction of critical care for this patient from another provider in my specialty: no     (including critical care time)  Medications Ordered in ED Medications  cefTRIAXone (ROCEPHIN) 1 g in sodium chloride 0.9 % 100 mL IVPB (0 g Intravenous Stopped 07/03/2020 1245)  sodium chloride 0.9 % bolus 1,000 mL (0 mLs Intravenous Stopped 07/06/2020 1245)    ED Course  I have reviewed the triage vital signs and the nursing notes.  Pertinent labs & imaging results that were available during my care of the patient were reviewed by me and considered in my medical decision making (see chart for details).  Clinical Course as of 06/19/2020 1333  Mon Jun 15, 2020  1057 Glucose-Capillary(!): 262 [HK]  1057 Temp(!): 93.5 F (34.2 C) [HK]  1101 EKG 12-Lead New T wave changes from prior.  [HK]  1104 WBC(!): 19.7 [HK]  1104 DG Chest Portable 1 View No acute findings. [HK]  1121 Sodium(!): 151 [HK]  1121 Creatinine(!): 1.24 [HK]  1121 Anion gap(!): 24 [HK]  1127 Will order IV fluids and IV antibiotics for leukocytosis, hypothermia, tachycardia and elevated lactic acid level. [HK]  [id number] Patient slightly more interactive and able to move extremities. Son states still not at her baseline. [HK]  1146 SARS Coronavirus 2 by RT PCR: NEGATIVE [HK]  1301 Lactic Acid, Venous: 1.5 [HK]  1301 Troponin I (High Sensitivity): 15 [HK]  1312 Bacteria, UA(!): RARE [HK]    Clinical Course User Index [HK] Delia Heady, PA-C   MDM Rules/Calculators/A&P                          80 year old female with past medical history of CAD, diabetes, hypertension presenting to the ED with  altered mental status.  History is provided by patient's POA and son over the phone.  Progressive worsening generalized weakness, failure to thrive, decreased appetite for the past few months.  Worsened over 3 days.  Reports 1 week history of urinary incontinence which is new for her.  Went to bed last night at 6:45 PM.  When he found her this morning she was laying in her bed, not responding to anything he was saying.  States that she would only respond to painful stimuli.  Upon arrival patient hypothermic to 93 rectally.  She is able to track with her eyes and can follow some commands otherwise is not responding.  She is supporting her own airway. She has no external signs of trauma.  Abdomen is soft.  Work-up here significant for EKG showing new T wave inversions, troponin XV.  Sodium of 151 and creatinine of 1.2, anion gap elevated at 24 and she was given IV fluids.  Due to her hypothermia, leukocytosis and lactic acid level elevation started her on IV antibiotics.  Chest x-ray without any acute findings.  CT of the head without any evidence of hemorrhage.  UA without evidence of infection.  Her mental status has somewhat  improved now that her body temperature has improved.  However her son states that she is still not at her baseline as she was yesterday.  Will admit to medicine service for ongoing management of altered mental status in the setting of her chronic medical issues.    Portions of this note were generated with Scientist, clinical (histocompatibility and immunogenetics). Dictation errors may occur despite best attempts at proofreading.  Final Clinical Impression(s) / ED Diagnoses Final diagnoses:  Altered mental status, unspecified altered mental status type  Hypothermia, initial encounter    Rx / DC Orders ED Discharge Orders    None       Dietrich Pates, PA-C 07/06/2020 1333    Jacalyn Lefevre, MD 06/20/2020 1340

## 2020-06-15 NOTE — ED Notes (Signed)
Paged Dr. Antionette Char regarding patient's recent labs. Awaiting callback. Will continue to monitor.

## 2020-06-15 NOTE — ED Notes (Signed)
Patient placed in hospital bed for comfort. Insulin drip d/c and started on subq insulin. Monitoring remains. Family at bedside. Call bell in reach. Will continue to monitor.

## 2020-06-15 NOTE — ED Notes (Signed)
Pt not responsive to verbal stimuli, but responds to pain. Pt's core temperature low at this time. Temp sensing Foley in place. Bair hugger in place too. Pt has abnormal breathing, but is oxgenating at 94% on 2L [place]. Son/POA at bedside with patient.

## 2020-06-15 NOTE — Progress Notes (Signed)
Inpatient Diabetes Program Recommendations  AACE/ADA: New Consensus Statement on Inpatient Glycemic Control (2015)  Target Ranges:  Prepandial:   less than 140 mg/dL      Peak postprandial:   less than 180 mg/dL (1-2 hours)      Critically ill patients:  140 - 180 mg/dL   Lab Results  Component Value Date   GLUCAP 222 (H) 07/05/2020   HGBA1C 7.7 (H) 04/21/2020    Review of Glycemic Control  Diabetes history: DM2 Outpatient Diabetes medications: metformin 1000 mg bid, Steglatro 5 mg QD Current orders for Inpatient glycemic control: IV insulin per [facility] for DKA  Inpatient Diabetes Program Recommendations:     Agree with orders.  DKA possibly from SGLT2, especially with lower glucose presentation in DKA.  Will follow closely.  Thank you. Ailene Ards, RD, LDN, CDE Inpatient Diabetes Coordinator [phone]

## 2020-06-16 ENCOUNTER — Ambulatory Visit: Payer: Self-pay | Admitting: *Deleted

## 2020-06-16 DIAGNOSIS — R6510 Systemic inflammatory response syndrome (SIRS) of non-infectious origin without acute organ dysfunction: Secondary | ICD-10-CM | POA: Diagnosis not present

## 2020-06-16 DIAGNOSIS — G9341 Metabolic encephalopathy: Secondary | ICD-10-CM

## 2020-06-16 DIAGNOSIS — T68XXXA Hypothermia, initial encounter: Secondary | ICD-10-CM | POA: Diagnosis not present

## 2020-06-16 DIAGNOSIS — A419 Sepsis, unspecified organism: Secondary | ICD-10-CM | POA: Diagnosis present

## 2020-06-16 DIAGNOSIS — E1110 Type 2 diabetes mellitus with ketoacidosis without coma: Secondary | ICD-10-CM | POA: Diagnosis not present

## 2020-06-16 DIAGNOSIS — R627 Adult failure to thrive: Secondary | ICD-10-CM | POA: Diagnosis not present

## 2020-06-16 LAB — CBC
HCT: 37.3 % (ref 36.0–46.0)
Hemoglobin: 11.7 g/dL — ABNORMAL LOW (ref 12.0–15.0)
MCH: 29.9 pg (ref 26.0–34.0)
MCHC: 31.4 g/dL (ref 30.0–36.0)
MCV: 95.4 fL (ref 80.0–100.0)
Platelets: 247 10*3/uL (ref 150–400)
RBC: 3.91 MIL/uL (ref 3.87–5.11)
RDW: 13.5 % (ref 11.5–15.5)
WBC: 26.7 10*3/uL — ABNORMAL HIGH (ref 4.0–10.5)
nRBC: 0 % (ref 0.0–0.2)

## 2020-06-16 LAB — BASIC METABOLIC PANEL
Anion gap: 11 (ref 5–15)
BUN: 24 mg/dL — ABNORMAL HIGH (ref 8–23)
CO2: 20 mmol/L — ABNORMAL LOW (ref 22–32)
Calcium: 9.7 mg/dL (ref 8.9–10.3)
Chloride: 127 mmol/L — ABNORMAL HIGH (ref 98–111)
Creatinine, Ser: 0.79 mg/dL (ref 0.44–1.00)
GFR, Estimated: >60 mL/min (ref >60)
Glucose, Bld: 161 mg/dL — ABNORMAL HIGH (ref 70–99)
Potassium: 3.3 mmol/L — ABNORMAL LOW (ref 3.5–5.1)
Sodium: 158 mmol/L — ABNORMAL HIGH (ref 135–145)

## 2020-06-16 LAB — COMPREHENSIVE METABOLIC PANEL
ALT: 13 U/L (ref 0–44)
AST: 14 U/L — ABNORMAL LOW (ref 15–41)
Albumin: 2.7 g/dL — ABNORMAL LOW (ref 3.5–5.0)
Alkaline Phosphatase: 56 U/L (ref 38–126)
Anion gap: 14 (ref 5–15)
BUN: 23 mg/dL (ref 8–23)
CO2: 19 mmol/L — ABNORMAL LOW (ref 22–32)
Calcium: 9.2 mg/dL (ref 8.9–10.3)
Chloride: 122 mmol/L — ABNORMAL HIGH (ref 98–111)
Creatinine, Ser: 0.65 mg/dL (ref 0.44–1.00)
GFR, Estimated: >60 mL/min (ref >60)
Glucose, Bld: 131 mg/dL — ABNORMAL HIGH (ref 70–99)
Potassium: 3.7 mmol/L (ref 3.5–5.1)
Sodium: 155 mmol/L — ABNORMAL HIGH (ref 135–145)
Total Bilirubin: 0.7 mg/dL (ref 0.3–1.2)
Total Protein: 5.4 g/dL — ABNORMAL LOW (ref 6.5–8.1)

## 2020-06-16 LAB — CBG MONITORING, ED
Glucose-Capillary: 95 mg/dL (ref 70–99)
Glucose-Capillary: 132 mg/dL — ABNORMAL HIGH (ref 70–99)
Glucose-Capillary: 118 mg/dL — ABNORMAL HIGH (ref 70–99)
Glucose-Capillary: 127 mg/dL — ABNORMAL HIGH (ref 70–99)
Glucose-Capillary: 184 mg/dL — ABNORMAL HIGH (ref 70–99)

## 2020-06-16 LAB — URINE CULTURE: Culture: NO GROWTH

## 2020-06-16 LAB — MAGNESIUM: Magnesium: 2.1 mg/dL (ref 1.7–2.4)

## 2020-06-16 MED ORDER — HALOPERIDOL LACTATE 5 MG/ML IJ SOLN: 0.5000 mg | INTRAMUSCULAR | Status: DC | PRN

## 2020-06-16 MED ORDER — POTASSIUM CHLORIDE IN NACL 20-0.45 MEQ/L-% IV SOLN
INTRAVENOUS | Status: DC
  Filled 2020-06-16: qty 1000

## 2020-06-16 MED ORDER — BIOTENE DRY MOUTH MT LIQD: 15.0000 mL | OROMUCOSAL | Status: DC | PRN

## 2020-06-16 MED ORDER — ONDANSETRON HCL 4 MG/2ML IJ SOLN: 4.0000 mg | Freq: Four times a day (QID) | INTRAMUSCULAR | Status: DC | PRN

## 2020-06-16 MED ORDER — LORAZEPAM 2 MG/ML IJ SOLN: 1.0000 mg | INTRAMUSCULAR | Status: DC | PRN

## 2020-06-16 MED ORDER — ONDANSETRON 4 MG PO TBDP: 4.0000 mg | ORAL_TABLET | Freq: Four times a day (QID) | ORAL | Status: DC | PRN

## 2020-06-16 MED ORDER — LORAZEPAM 2 MG/ML PO CONC: 1.0000 mg | ORAL | Status: DC | PRN

## 2020-06-16 MED ORDER — HALOPERIDOL LACTATE 2 MG/ML PO CONC
0.5000 mg | ORAL | Status: DC | PRN
  Filled 2020-06-16: qty 0.3

## 2020-06-16 MED ORDER — POLYVINYL ALCOHOL 1.4 % OP SOLN
1.0000 [drp] | Freq: Every day | OPHTHALMIC | Status: DC | PRN
  Filled 2020-06-16: qty 15

## 2020-06-16 MED ORDER — MORPHINE BOLUS VIA INFUSION
2.0000 mg | INTRAVENOUS | Status: DC | PRN
  Filled 2020-06-16: qty 2

## 2020-06-16 MED ORDER — GLYCOPYRROLATE 0.2 MG/ML IJ SOLN: 0.2000 mg | INTRAMUSCULAR | Status: DC | PRN

## 2020-06-16 MED ORDER — MORPHINE 100MG IN NS 100ML (1MG/ML) PREMIX INFUSION
1.0000 mg/h | INTRAVENOUS | Status: DC
  Administered 2020-06-16: 1 mg/h via INTRAVENOUS
  Filled 2020-06-16: qty 100

## 2020-06-16 MED ORDER — POLYVINYL ALCOHOL 1.4 % OP SOLN
1.0000 [drp] | Freq: Four times a day (QID) | OPHTHALMIC | Status: DC | PRN
  Filled 2020-06-16: qty 15

## 2020-06-16 MED ORDER — GLYCOPYRROLATE 0.2 MG/ML IJ SOLN
0.2000 mg | INTRAMUSCULAR | Status: DC | PRN
Start: 2020-06-16 — End: 2020-06-17

## 2020-06-16 MED ORDER — DIFLUPREDNATE 0.05 % OP EMUL: 1.0000 [drp] | Freq: Every day | OPHTHALMIC | Status: DC

## 2020-06-17 ENCOUNTER — Other Ambulatory Visit: Payer: Self-pay | Admitting: *Deleted

## 2020-06-17 NOTE — Patient Outreach (Addendum)
[facility]) Care Management  06/13/2020  [name] 1941-05-15 [id number]   [facility] case closure  [facility] RN CM notified by [facility] hospital liaison of Mrs [name] Wright passing  Review of Epic note indicates Dr Antionette Char documented on 06/27/20 that he was notified of patient expiring at 23:14 06-27-2020, pronounced by RNs. patient was on comfort care and family were at bedside [facility] case closed Note routed to MDs on Epic care team  [place]unt[place] L. Noelle Penner, RN, BSN, CCM [facility] Telephonic Care Management Care Coordinator Office number [phone] Main [facility] Main number [phone] Fax number [phone]

## 2020-06-20 LAB — CULTURE, BLOOD (ROUTINE X 2)
Culture: NO GROWTH
Culture: NO GROWTH
Special Requests: ADEQUATE

## 2020-06-24 ENCOUNTER — Ambulatory Visit: Payer: Self-pay | Admitting: *Deleted

## 2020-06-26 ENCOUNTER — Encounter: Payer: Medicare Other | Admitting: Orthopedic Surgery

## 2020-06-26 ENCOUNTER — Ambulatory Visit: Payer: Medicare Other | Admitting: Cardiology

## 2020-06-29 ENCOUNTER — Ambulatory Visit: Payer: Medicare Other | Admitting: Cardiology

## 2020-06-30 ENCOUNTER — Encounter: Payer: TRICARE For Life (TFL) | Admitting: Neurology

## 2020-07-01 LAB — MISC LABCORP TEST (SEND OUT): Labcorp test code: 9985

## 2020-07-14 NOTE — Progress Notes (Signed)
   07/07/2020 2322  Clinical Encounter Type  Visited With Patient and family together  Visit Type Death  Referral From Nurse  Consult/Referral To Chaplain  Spiritual Encounters  Spiritual Needs Prayer   Chaplain responded to page. Pt's family was at bedside. Chaplain prayed with family and engaged in active listening and grief support. Chaplain remains available.   This note was prepared by Chaplain Resident, Tacy Learn, MDiv. Chaplain remains available as needed through the on-call pager: [phone].

## 2020-07-14 NOTE — Death Summary Note (Addendum)
  DEATH SUMMARY   Patient Details  Name: [name] Wright MRN: [id number] DOB: 1940/07/21  Admission/Discharge Information   Admit Date:  16-Jun-2020  Date of Death: Date of Death: June 17, 2020  Time of Death: Time of Death: 14-Sep-2312  Length of Stay: 2  Referring Physician: Yvonna Alanis, NP   Reason(s) for Hospitalization  AMS  Diagnoses  Preliminary cause of death:  Secondary Diagnoses (including complications and co-morbidities):  Active Problems:   Essential hypertension   Dysphagia   Failure to thrive in adult   SIRS (systemic inflammatory response syndrome) (HCC)   DKA, type 2 ([place])   Acute metabolic encephalopathy   Sepsis ([place])  SEPSIS RULED OUT  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 Jannifer Franklin -outpateint labs normal/unrevealing -was to have EMG in 16-Jun-2022 but had rapid progression of weakness and was transitioned to comfort care  DKA, type QM:VHQIO -resolved      Pertinent Labs and Studies  Significant Diagnostic Studies CT Head Wo Contrast  Result Date: 07/05/2020 CLINICAL DATA:  Mental status change, unknown cause. EXAM: CT HEAD WITHOUT CONTRAST TECHNIQUE: Contiguous axial images were obtained from the base of the skull through the vertex without intravenous contrast. COMPARISON:  Brain MRI 05/28/2020.  Head CT 06/27/2016. FINDINGS: Brain: Mildly motion degraded exam. Mild cerebral and cerebellar atrophy. Mild ill-defined hypoattenuation within the cerebral white matter is nonspecific, but compatible with chronic small vessel ischemic disease. Known chronic infarct within the right pons. There is no acute intracranial hemorrhage. No demarcated cortical infarct. No extra-axial fluid collection. No evidence of intracranial mass. No midline shift. Vascular: No hyperdense vessel.  Atherosclerotic calcifications. Skull: No calvarial fracture 10 mm exostosis arising from the right frontal calvarium (series 4, image 63). Sinuses/Orbits: Visualized orbits show no acute  finding. Mild left ethmoid sinus mucosal thickening. IMPRESSION: No evidence of acute intracranial abnormality. Known chronic infarct within the right pons. Background mild generalized atrophy of the brain and chronic small vessel ischemic disease. 10 mm bony exostosis arising from the right frontal calvarium. Mild left ethmoid sinus mucosal thickening. Electronically Signed   By: Kellie Simmering DO   On: 07/08/2020 11:15   DG Chest Portable 1 View  Result Date: 06/29/2020 CLINICAL DATA:  Weakness EXAM: PORTABLE CHEST 1 VIEW COMPARISON:  05/26/2020 FINDINGS: The heart size and mediastinal contours are within normal limits. Both lungs are clear. The visualized skeletal structures are unremarkable. IMPRESSION: No acute abnormality of the lungs in AP portable projection. Electronically Signed   By: Eddie Candle M.D.   On: 07/03/2020 10:58    Microbiology No results found for this or any previous visit (from the past 240 hour(s)).  Lab Basic Metabolic Panel: No results for input(s): NA, K, CL, CO2, GLUCOSE, BUN, CREATININE, CALCIUM, MG, PHOS in the last 168 hours. Liver Function Tests: No results for input(s): AST, ALT, ALKPHOS, BILITOT, PROT, ALBUMIN in the last 168 hours. No results for input(s): LIPASE, AMYLASE in the last 168 hours. No results for input(s): AMMONIA in the last 168 hours. CBC: No results for input(s): WBC, NEUTROABS, HGB, HCT, MCV, PLT in the last 168 hours. Cardiac Enzymes: No results for input(s): CKTOTAL, CKMB, CKMBINDEX, TROPONINI in the last 168 hours. Sepsis Labs: No results for input(s): PROCALCITON, WBC, LATICACIDVEN in the last 168 hours.   Geradine Girt 07/09/2020, 12:01 PM

## 2020-07-14 NOTE — Death Summary Note (Incomplete)
Incident: Pt as DNR on comfort care Assessment: On assessment patient had no respirations or heart sounds. Extremities were cold to touch and with no pulse.  Confirmation: Jacklyn Shell RN confirmed these findings as well.  Time of Death: 2312/10/08 [place] [facility]: Notified [phone]) Family: At bedside Notification:  Paulina Fusi, RN

## 2020-07-14 NOTE — ED Notes (Signed)
Pt given mouth swabs and water.

## 2020-07-14 NOTE — Significant Event (Signed)
Cross-coverage note:   Notified of patient expiring at 23:14, pronounced by RNs. She was on comfort care and family were at bedside. Death certificate will be completed electronically.

## 2020-07-14 NOTE — Progress Notes (Addendum)
Daily Progress Note   Patient Name: [name] Wright       Date: June 23, 2020 DOB: 1941/04/26  Age: 80 y.o. MRN#: [id number] Attending Physician: Geradine Girt, DO Primary Care Physician: Yvonna Alanis, NP Admit Date: 07/02/2020  Reason for Consultation/Follow-up: continued GOC discussion  Subjective: Note that patient was seen by SLP earlier today and was unable to to cough to clear her airway of standing secretions.   17:20 - At bedside, patient appears to be agonally breathing. Se is minimally responsive. I met with son [name], daughter [name], and daughter Helene Kelp in the ED consultation room. Other son Karma Greaser was present by speaker phone. I expressed concern that patient is actively dying and recommended we transition to comfort care as soon as possible.   19:30 - patient appears comfortable on morphine infusion at 1 mg/hr. She has a bed on 5MW. [name] expresses concern that she will not survive transfer. We discuss that it is a difficult call to make as her vital signs are "stable" but her respiratory pattern is agonal. Family requests chaplain to see patient prior to transfer. I page the on-call chaplain who comes to bedside to offer spiritual support.   20:30 - I have noted that patient is on 6L (not sure why). Discuss with [name] that this is prolonging the dying process and recommend turning it down. After discussion with family, they agree. Oxygen has been decreased to 4L and morphine infusion increased to 2 mg/hr.   20:55 - Oxygen decreased to 2L and morphine increased to 3 mg/hr.   21:23 - ED Clinical coordinator speaks with this NP regarding patient situation, stating that patient cannot stay in the ED for much longer. If patient still here at 22:30, will need to transfer to 5MW. I let family  know this information. Discuss turning oxygen off. [name] expresses that if they have to move, he would prefer to move now.   21:50 - I help transport patient upstairs to 5MW, accompanied by NT. Report given to receiving RN on 5MW.   Length of Stay: 1  Current Medications: Scheduled Meds:  . Difluprednate  1 drop Right Eye Daily  . heparin  5,000 Units Subcutaneous Q8H  . insulin aspart  0-9 Units Subcutaneous Q4H    Continuous Infusions: . 0.45 % NaCl with KCl 20 mEq /  L      PRN Meds: acetaminophen, dextrose, polyvinyl alcohol  Physical Exam Vitals reviewed.  Constitutional:      Appearance: She is cachectic. She is ill-appearing.  Cardiovascular:     Rate and Rhythm: Tachycardia present.  Pulmonary:     Comments: agonal breathing Neurological:     Mental Status: She is lethargic.     Motor: Weakness present.             Vital Signs: BP (!) 125/52   Pulse (!) 122   Temp 99 F (37.2 C)   Resp 19   SpO2 100%  SpO2: SpO2: 100 % O2 Device: O2 Device: Nasal Cannula O2 Flow Rate: O2 Flow Rate (L/min): 2 L/min  Intake/output summary:   Intake/Output Summary (Last 24 hours) at 30-Jun-2020 1717 Last data filed at 06-30-2020 1211 Gross per 24 hour  Intake 835.22 ml  Output 1325 ml  Net -489.78 ml        Palliative Assessment/Data: PPS 10%       Palliative Care Assessment & Plan   HPI/Patient Profile: 80 y.o. female  with past medical history of diabetes mellitus type 2, hypertension, CAD status post stent placement,essential tremor, and dysphagia presented to the emergency department on 06/21/2020 with altered mental status. Per son, patient was normal when she went to bed last night around 18:45 and this morning he found her lying in bed staring at the ceiling and initially not following any commands.  She was hospitalized 12/14--12/18 for failure to thrive and dysphagia. MRI at that time showed a remote lacunar infarct. She was referred to Dr. Jannifer Franklin with neurology  and was seen 12/21; there was concern for ALS with plans for EMG studies.  ED Course: Patient hypothermic with temperature 93.5. Labs significant for WBC 19.7, sodium 151, creatinine 1.24, BUN 32, glucose 292, anion gap 24, total bilirubin 1.7, lactic acid 2.3. Chest x-ray negative for acute abnormalities. Urinalysis positive for ketones and glucose. Patient admitted to [facility] for management of SIRS/possible sepsis with unknown source, DKA, acute metabolic encephalopathy, and dysphagia.   Assessment: - SIRS/sepsis, source unknown - DKA, type II - acute metabolic encephalopathy - dysphagia, acute on chronic - acute kidney injury - hypernatremia - failure to thrive  Recommendations/Plan:  Full comfort measures initiated  DNR/DNI as previously documented  Added orders for symptom management at EOL as well as discontinued orders that were not focused on comfort  Unrestricted visitation orders were placed per current [place] EOL visitation policy   Provide frequent assessments and administer PRN medications as clinically necessary to ensure EOL comfort  PMT will continue to follow holistically  Goals of Care and Additional Recommendations:  Limitations on Scope of Treatment: Full Comfort Care  Code Status:  DNR/DNI  Prognosis:   Hours - Days  Discharge Planning:  Anticipated Hospital Death  Care plan was discussed with Dr. Eliseo Squires  Thank you for allowing the Palliative Medicine Team to assist in the care of this patient.   Total Time 120 minutes Prolonged Time Billed  yes       Greater than 50%  of this time was spent counseling and coordinating care related to the above assessment and plan.  Lavena Bullion, NP  Please contact Palliative Medicine Team phone at [phone] for questions and concerns.

## 2020-07-14 NOTE — Evaluation (Signed)
Clinical/Bedside Swallow Evaluation Patient Details  Name: [name] Wright MRN: [id number] Date of Birth: 24-Jul-1940  Today's Date: 07/01/2020 Time: SLP Start Time (ACUTE ONLY): 49 SLP Stop Time (ACUTE ONLY): 1235 SLP Time Calculation (min) (ACUTE ONLY): 15 min  Past Medical History:  Past Medical History:  Diagnosis Date  . Aortic atherosclerosis ([place])   . BCC (basal cell carcinoma of skin) 06/24/1993   Left Shoulder  . 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 Cancer ([place])    skin and eye  . Constipation   . Coronary artery disease    NUCLEAR STRESS TEST, 09/30/2009 - EKG negative for ischemia  . Depression   . Diabetes mellitus without complication ([place])    Treated by diet  . Essential tremor    hands, sometimes legs   . Family history of coronary artery disease    Mother died 4  . GERD (gastroesophageal reflux disease)   . H/O subconjunctival hemorrhage   . Heart attack ([place])   . History of blood transfusion   . History of failure to thrive syndrome   . History of weakness   . Hyperlipidemia   . Hypertension   . 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 Lumbar back pain with radiculopathy affecting lower extremity   . Myocardial infarction ([place]) 2006  . Neck pain   . 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 Osteopenia   . Presence of pessary   . Retroperitoneal hematoma    S/P  . Schatzki's ring   . Shingles   . 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 Trapezius muscle spasm    right side  . Uterovaginal prolapse   . Weight loss    Past Surgical History:  Past Surgical History:  Procedure Laterality Date  . CATARACT EXTRACTION, BILATERAL    . COLONOSCOPY    . CORONARY ANGIOPLASTY WITH STENT PLACEMENT  11/05/2004   RCA DES  . DILATION AND CURETTAGE OF UTERUS    . LID LESION EXCISION Left 02/14/2018    Procedure: LEFT LID LESION EXCISION;  Surgeon: Clista Bernhardt, MD;  Location: [facility];  Service: Ophthalmology;  Laterality: Left;  . RECONSTRUCTION OF EYELID Left 02/14/2018   Procedure: TOTAL RECONSTRUCTION OF EYELID;  Surgeon: Clista Bernhardt, MD;  Location: [facility];  Service: Ophthalmology;  Laterality: Left;  . SKIN FULL THICKNESS GRAFT Left 02/14/2018   Procedure: SKIN GRAFT FULL THICKNESS LEFT EYE;  Surgeon: Clista Bernhardt, MD;  Location: [facility];  Service: Ophthalmology;  Laterality: Left;  . TONSILECTOMY/ADENOIDECTOMY WITH MYRINGOTOMY    . TUBAL LIGATION     HPI:  80 y.o. female  with past medical history of diabetes mellitus type 2, hypertension, CAD status post stent placement,essential tremor, and dysphagia presented to the emergency department on 06/19/2020 with altered mental status. Per son, patient was normal when she went to bed last night around 18:45 and this morning he found her lying in bed staring at the ceiling and initially not following any commands.   She was hospitalized 12/14--12/18 for failure to thrive and dysphagia. MRI at that time showed a remote lacunar infarct. She was referred to Dr. Jannifer Franklin with neurology and was seen 12/21; there was concern for ALS with plans for EMG studies. MBS on 05/27/20 shows Mild oropharyngeal dysphagia with sensorimotor deficits. Decreased oropharyngeal motility  results in mild pharyngeal residuals and laryngeal penetration/trace aspiration of liquids. Cued cough was weak and not effective to clear aspirates.  Dry swallows helpful to decrease pharyngeal retention.   Assessment / Plan / Recommendation Clinical Impression  Pt demonstrates a severe decline since last seen on 06/06/20. Pt alert, breath support profoundly impaired, unable to cough to clear her airway of standing secretions. Pt has head tilted posteriorally to gasp for air. O2 sats read 90s, but pt tachypneic after suctioning and appears to be in respiratory distress. No POs were  given. Called RN to room to assist pt. Anticipate that pt has minimal swallowing ability, is not protecting or clearing her airway. Pt should remain NPO; SLP will follow, but prognosis appears poor at this time. SLP Visit Diagnosis: Dysphagia, oropharyngeal phase (R13.12)    Aspiration Risk  Severe aspiration risk;Risk for inadequate nutrition/hydration    Diet Recommendation NPO   Medication Administration: Via alternative means    Other  Recommendations Oral Care Recommendations: Oral care QID Other Recommendations: Have oral suction available   Follow up Recommendations Skilled Nursing facility      Frequency and Duration            Prognosis Prognosis for Safe Diet Advancement: Guarded      Swallow Study   General HPI: 80 y.o. female  with past medical history of diabetes mellitus type 2, hypertension, CAD status post stent placement,essential tremor, and dysphagia presented to the emergency department on 07/07/2020 with altered mental status. Per son, patient was normal when she went to bed last night around 18:45 and this morning he found her lying in bed staring at the ceiling and initially not following any commands.   She was hospitalized 12/14--12/18 for failure to thrive and dysphagia. MRI at that time showed a remote lacunar infarct. She was referred to Dr. Anne Hahn with neurology and was seen 12/21; there was concern for ALS with plans for EMG studies. MBS on 05/27/20 shows Mild oropharyngeal dysphagia with sensorimotor deficits. Decreased oropharyngeal motility results in mild pharyngeal residuals and laryngeal penetration/trace aspiration of liquids. Cued cough was weak and not effective to clear aspirates.  Dry swallows helpful to decrease pharyngeal retention. Type of Study: Bedside Swallow Evaluation Diet Prior to this Study: NPO Temperature Spikes Noted: No Respiratory Status: Nasal cannula;Venti-mask Behavior/Cognition: Alert;Confused Oral Cavity Assessment: Dry;Dried  secretions Oral Care Completed by SLP: Yes Oral Cavity - Dentition: Adequate natural dentition Self-Feeding Abilities: Total assist Baseline Vocal Quality: Aphonic Volitional Cough: Weak    Oral/Motor/Sensory Function Overall Oral Motor/Sensory Function: Severe impairment   Ice Chips Ice chips: Not tested   Thin Liquid Thin Liquid: Not tested    Nectar Thick Nectar Thick Liquid: Not tested   Honey Thick Honey Thick Liquid: Not tested   Puree Puree: Not tested   Solid           Harlon Ditty, MA CCC-SLP  Acute Rehabilitation Services Pager [phone] Office [phone]  Claudine Mouton 07/01/2020,1:15 PM

## 2020-07-14 NOTE — Progress Notes (Signed)
Progress Note    [name] Wright  DJT:[id number] DOB: 16-Apr-1941  DOA: 06/23/2020 PCP: Yvonna Alanis, NP    Brief Narrative:     Medical records reviewed and are as summarized below:  [name] Wright is an 80 y.o. female with medical history significant of hypertension, CAD s/p stenting, diabetes mellitus type 2, essential tremor, and dysphagia found to be acutely altered this morning by her son who is a Proofreader here at [employer].  Patient went to sleep around 6:45 PM last night and around 8 AM this morning he found her laying in the bed staring at the ceiling not initially responding to verbal commands. Eventually, he was able to get her to follow some commands, but she still was not able to talk and he called EMS.  Over the last year, the patient has had a progressive decline.  She has lost about 50 pounds with increased difficulty speaking and swallowing.  Last hospitalized from 12/14-12/18 for failure to thrive and dysphasia.  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  Neurology had been consulted.  Work-up included ESR , protein electrophoresis immunofixation, and ANA were either negative or within normal limits.  She was referred to Dr. Jannifer Franklin of neurology and had a follow-up appointment on 12/21 where TSH, acetylcholine receptor binding antibodies, copper, and vitamin B12 levels were all noted to be within normal limits.  There was concern for ALS with plans for EMG studies.  Over the last 3 days or so patient had not been eating much of anything.   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 Jannifer Franklin -outpateint labs normal/unrevealing -was to have EMG in January but had rapid progression of weakness  Acute  respiratory failure -appears to be aspirating -not managing her airway well -aspiration precautions -wean oxygen as tolerated- < 87% on RA  DKA, type II: Acute.  Patient presents with glucose elevated up to 292 on admission with CO2 11, and anion gap 24.  Last hemoglobin A1c was 7.7 on 11/92021.  Urinalysis positive for ketones and glucose. -Hold Metformin and ertuglifozin -transitioned from IV insulin to SSI  Dysphagia: Acute on chronic.  Patient was evaluated by speech during last hospitalization and started on dysphagia 3 diet.She is currently being worked up in the outpatient setting by Dr. Jannifer Franklin of neurology.  So far work-up has been inconclusive as to the likely cause of patient's failure to thrive symptoms and dysarthria, but thought to be possibly related to ALS. -failed bedside swallow eval  Acute kidney injury:  -resolved with IVF  leukocytosis -unclear but possibly aspiration -check diff  Hypernatremia: Acute.  Initial sodium noted to be 151 and peaked at 158.  Patient noted not to be eating and drinking much.  Suspect likely a hypovolemic hypernatremia. -continue IVF  Essential hypertension: Blood pressures currently stable.   -no able to take PO so would need IV prn  Failure to thrive: Acute on chronic.  Patient noted not to be eating or drinking over the last 3 days.   -did not pass swallow eval  Family Communication/Anticipated D/C date and plan/Code Status    Code Status: partial Family Communication: spoke with son [name] Disposition  Plan: Status is: Inpatient  Remains inpatient appropriate because:Inpatient level of care appropriate due to severity of illness   Dispo: The patient is from: Home              Anticipated d/c is to: tbd              Anticipated d/c date is: 1 day              Patient currently is not medically stable to d/c. patient has had rapid decline of her mental status and ability to protect her airway.  Concern for further  decompensation and recommend transition to comfort care-- family to meet with palliative care and form plan today.           Medical Consultants:    Palliative care.     Subjective:   Wet upper airways sounds Attempting to communicate--- says she is having trouble swallowing  Objective:    Vitals:   2020/07/13 1240 2020-07-13 1245 July 13, 2020 1300 07/13/2020 1315  BP:   (!) 125/52   Pulse: (!) 138 (!) 134 (!) 124 (!) 122  Resp: (!) 30 (!) _0 Temp: 98.7 F (37.1 C) 98.8 F (37.1 C) 99 F (37.2 C) 99 F (37.2 C)  SpO2: (!) 87% 95% 100% 100%    Intake/Output Summary (Last 24 hours) at 07/13/20 1516 Last data filed at 07/13/2020 1211 Gross per 24 hour  Intake 1031.49 ml  Output 1325 ml  Net -293.51 ml   There were no vitals filed for this visit.  Exam:  General: Appearance:    frail  female who appear uncomfortable, muscle wasting apparent      Lungs:     Wet upper airway sounds. On O2.  Heart:    Tachycardic. Normal rhythm. No murmurs, rubs, or gallops.   MS:   All extremities are intact.   Neurologic:   Awake, alert, speech wet and soft    Data Reviewed:   I have personally reviewed following labs and imaging studies:  Labs: Labs show the following:   Basic Metabolic Panel: Recent Labs  Lab 06/29/2020 1450 07/03/2020 1753 06/27/2020 2147 07-13-2020 0005 July 13, 2020 0935  NA 155* 155* 156* 158* 155*  K 3.4* 3.5 3.1* 3.3* 3.7  CL 121* 124* 127* 127* 122*  CO2 12* 15* 20* 20* 19*  GLUCOSE 245* 228* 197* 161* 131*  BUN 32* 29* 26* 24* 23  CREATININE 1.09* 1.07* 0.79 0.79 0.65  CALCIUM 9.1 9.4 9.6 9.7 9.2  MG 2.2  --   --   --  2.1  PHOS 4.0  --   --   --   --    GFR CrCl cannot be calculated (Unknown ideal weight.). Liver Function Tests: Recent Labs  Lab 07/13/2020 1018 07/13/20 0935  AST 25 14*  ALT 15 13  ALKPHOS 67 56  BILITOT 1.7* 0.7  PROT 6.3* 5.4*  ALBUMIN 3.5 2.7*   No results for input(s): LIPASE, AMYLASE in the last 168 hours. No  results for input(s): AMMONIA in the last 168 hours. Coagulation profile No results for input(s): INR, PROTIME in the last 168 hours.  CBC: Recent Labs  Lab 06/19/2020 1018 July 13, 2020 0425  WBC 19.7* 26.7*  HGB 14.1 11.7*  HCT 46.7* 37.3  MCV 99.4 95.4  PLT 577* 247   Cardiac Enzymes: No results for input(s): CKTOTAL, CKMB, CKMBINDEX, TROPONINI in the last 168 hours. BNP (last 3 results) No results for input(s): PROBNP in the last 8760  hours. CBG: Recent Labs  Lab 06/26/2020 2319 07/15/20 0423 07-15-2020 0634 2020-07-15 0847 Jul 15, 2020 1209  GLUCAP 152* 95 132* 118* 127*   D-Dimer: No results for input(s): DDIMER in the last 72 hours. Hgb A1c: No results for input(s): HGBA1C in the last 72 hours. Lipid Profile: No results for input(s): CHOL, HDL, LDLCALC, TRIG, CHOLHDL, LDLDIRECT in the last 72 hours. Thyroid function studies: No results for input(s): TSH, T4TOTAL, T3FREE, THYROIDAB in the last 72 hours.  Invalid input(s): FREET3 Anemia work up: No results for input(s): VITAMINB12, FOLATE, FERRITIN, TIBC, IRON, RETICCTPCT in the last 72 hours. Sepsis Labs: Recent Labs  Lab 06/18/2020 1018 06/18/2020 1223 07-15-2020 0425  WBC 19.7*  --  26.7*  LATICACIDVEN 2.3* 1.5  --     Microbiology Recent Results (from the past 240 hour(s))  Resp Panel by RT-PCR (Flu A&B, Covid) Nasopharyngeal Swab     Status: None   Collection Time: 07/06/2020 10:20 AM   Specimen: Nasopharyngeal Swab; Nasopharyngeal(NP) swabs in vial transport medium  Result Value Ref Range Status   SARS Coronavirus 2 by RT PCR NEGATIVE NEGATIVE Final    Comment: (NOTE) SARS-CoV-2 target nucleic acids are NOT DETECTED.  The SARS-CoV-2 RNA is generally detectable in upper respiratory specimens during the acute phase of infection. The lowest concentration of SARS-CoV-2 viral copies this assay can detect is 138 copies/mL. A negative result does not preclude SARS-Cov-2 infection and should not be used as the sole basis  for treatment or other patient management decisions. A negative result may occur with  improper specimen collection/handling, submission of specimen other than nasopharyngeal swab, presence of viral mutation(s) within the areas targeted by this assay, and inadequate number of viral copies(<138 copies/mL). A negative result must be combined with clinical observations, patient history, and epidemiological information. The expected result is Negative.  Fact Sheet for Patients:  EntrepreneurPulse.com.au  Fact Sheet for Healthcare Providers:  IncredibleEmployment.be  This test is no t yet approved or cleared by the [place] FDA and  has been authorized for detection and/or diagnosis of SARS-CoV-2 by FDA under an Emergency Use Authorization (EUA). This EUA will remain  in effect (meaning this test can be used) for the duration of the COVID-19 declaration under Section 564(b)(1) of the Act, 21 U.S.C.section 360bbb-3(b)(1), unless the authorization is terminated  or revoked sooner.       Influenza A by PCR NEGATIVE NEGATIVE Final   Influenza B by PCR NEGATIVE NEGATIVE Final    Comment: (NOTE) The Xpert Xpress SARS-CoV-2/FLU/RSV plus assay is intended as an aid in the diagnosis of influenza from Nasopharyngeal swab specimens and should not be used as a sole basis for treatment. Nasal washings and aspirates are unacceptable for Xpert Xpress SARS-CoV-2/FLU/RSV testing.  Fact Sheet for Patients: EntrepreneurPulse.com.au  Fact Sheet for Healthcare Providers: IncredibleEmployment.be  This test is not yet approved or cleared by the [place] FDA and has been authorized for detection and/or diagnosis of SARS-CoV-2 by FDA under an Emergency Use Authorization (EUA). This EUA will remain in effect (meaning this test can be used) for the duration of the COVID-19 declaration under Section 564(b)(1) of the Act, 21  U.S.C. section 360bbb-3(b)(1), unless the authorization is terminated or revoked.  Performed at [facility], [place] [address], [place], [place] [address]   Culture, blood (routine x 2)     Status: None (Preliminary result)   Collection Time: 06/26/2020 10:25 AM   Specimen: BLOOD RIGHT ARM  Result Value Ref Range Status  Specimen Description BLOOD RIGHT ARM  Final   Special Requests   Final    BOTTLES DRAWN AEROBIC AND ANAEROBIC Blood Culture adequate volume   Culture   Final    NO GROWTH < 24 HOURS Performed at [facility], [address], [place], [place] [address]    Report Status PENDING  Incomplete  Culture, blood (routine x 2)     Status: None (Preliminary result)   Collection Time: 06/29/2020 10:37 AM   Specimen: BLOOD RIGHT HAND  Result Value Ref Range Status   Specimen Description BLOOD RIGHT HAND  Final   Special Requests   Final    BOTTLES DRAWN AEROBIC AND ANAEROBIC Blood Culture results may not be optimal due to an inadequate volume of blood received in culture bottles   Culture   Final    NO GROWTH < 24 HOURS Performed at [facility], [place] [address] [place], [place] [address]    Report Status PENDING  Incomplete  Urine culture     Status: None   Collection Time: 07/01/2020 11:40 AM   Specimen: Urine, Random  Result Value Ref Range Status   Specimen Description URINE, RANDOM  Final   Special Requests NONE  Final   Culture   Final    NO GROWTH Performed at [facility], [address], [place], [place] [address]    Report Status June 22, 2020 FINAL  Final    Procedures and diagnostic studies:  CT Head Wo Contrast  Result Date: 06/28/2020 CLINICAL DATA:  Mental status change, unknown cause. EXAM: CT HEAD WITHOUT CONTRAST TECHNIQUE: Contiguous axial images were obtained from the base of the skull through the vertex without intravenous contrast. COMPARISON:  Brain MRI 05/28/2020.  Head CT 06/27/2016. FINDINGS: Brain: Mildly motion degraded  exam. Mild cerebral and cerebellar atrophy. Mild ill-defined hypoattenuation within the cerebral white matter is nonspecific, but compatible with chronic small vessel ischemic disease. Known chronic infarct within the right pons. There is no acute intracranial hemorrhage. No demarcated cortical infarct. No extra-axial fluid collection. No evidence of intracranial mass. No midline shift. Vascular: No hyperdense vessel.  Atherosclerotic calcifications. Skull: No calvarial fracture 10 mm exostosis arising from the right frontal calvarium (series 4, image 63). Sinuses/Orbits: Visualized orbits show no acute finding. Mild left ethmoid sinus mucosal thickening. IMPRESSION: No evidence of acute intracranial abnormality. Known chronic infarct within the right pons. Background mild generalized atrophy of the brain and chronic small vessel ischemic disease. 10 mm bony exostosis arising from the right frontal calvarium. Mild left ethmoid sinus mucosal thickening. Electronically Signed   By: Kellie Simmering DO   On: 07/10/2020 11:15   DG Chest Portable 1 View  Result Date: 07/11/2020 CLINICAL DATA:  Weakness EXAM: PORTABLE CHEST 1 VIEW COMPARISON:  05/26/2020 FINDINGS: The heart size and mediastinal contours are within normal limits. Both lungs are clear. The visualized skeletal structures are unremarkable. IMPRESSION: No acute abnormality of the lungs in AP portable projection. Electronically Signed   By: Eddie Candle M.D.   On: 07/12/2020 10:58    Medications:   . heparin  5,000 Units Subcutaneous Q8H  . insulin aspart  0-9 Units Subcutaneous Q4H   Continuous Infusions: . 0.45 % NaCl with KCl 20 mEq / L       LOS: 1 day   Geradine Girt  [facility]   How to contact the [facility] Attending or Consulting provider [place] or covering provider during after hours [place], for  this patient?  1. Check the care team in [facility] and look for a) attending/consulting [facility] provider listed and b) the [facility] team listed 2. Log  into [email or web address] and use [place]'s universal password to access. If you do not have the password, please contact the hospital operator. 3. Locate the [facility] provider you are looking for under [facility] and page to a number that you can be directly reached. 4. If you still have difficulty reaching the provider, please page the [facility] (Director on Call) for the Hospitalists listed on amion for assistance.  07-05-2020, 3:16 PM

## 2020-07-14 NOTE — Death Summary Note (Incomplete)
Incident: Pt as DNR on comfort care Assessment: On assessment patient had no respirations or heart sounds. Extremities were cold to touch and with no pulse.  Confirmation: Anne Lowe RN confirmed these findings as well.  Time of Death: [address] [facility] [facility]: Notified (06/19/2020-98) Family: At bedside Notification:  Camilo Mander Lavern, RN 

## 2020-07-14 NOTE — ED Notes (Signed)
Suction large mucous plug from pts throat at 1240. Pt has become SOB and SpO2 dropped <90%. Pt placed on NRB 15L for SpO2 >94%. Will continue to monitor pt. MD Benjamine Mola, notified.

## 2020-07-14 NOTE — Progress Notes (Signed)
   06/21/2020 1935  Clinical Encounter Type  Visited With Patient and family together  Visit Type Initial;Patient actively dying  Referral From Nurse  Consult/Referral To Chaplain  Spiritual Encounters  Spiritual Needs Prayer   Chaplain responded to page from NP Amil Amen, Palliative. Chaplain prayed with Pt's son, 2 daughters and 2 grandchildren at bedside. Chaplain remains available.  This note was prepared by Chaplain Resident, Tacy Learn, MDiv. Chaplain remains available as needed through the on-call pager: [phone].

## 2020-07-14 DEATH — deceased

## 2020-08-06 ENCOUNTER — Other Ambulatory Visit: Payer: Medicare Other

## 2020-08-26 ENCOUNTER — Ambulatory Visit: Payer: Medicare Other | Admitting: Neurology

## 2021-06-27 IMAGING — US US THYROID
1 series · 14 of 25 positions shown · non-contrast
Comparison: None.

CLINICAL DATA: Other.  Hoarseness, dysphagia

EXAM:
THYROID ULTRASOUND
TECHNIQUE: Ultrasound examination of the thyroid gland and adjacent soft
tissues was performed.

[Series 1: us thyroid · 0.03mm/px · 14 of 51 slices shown]
[im 1/51]
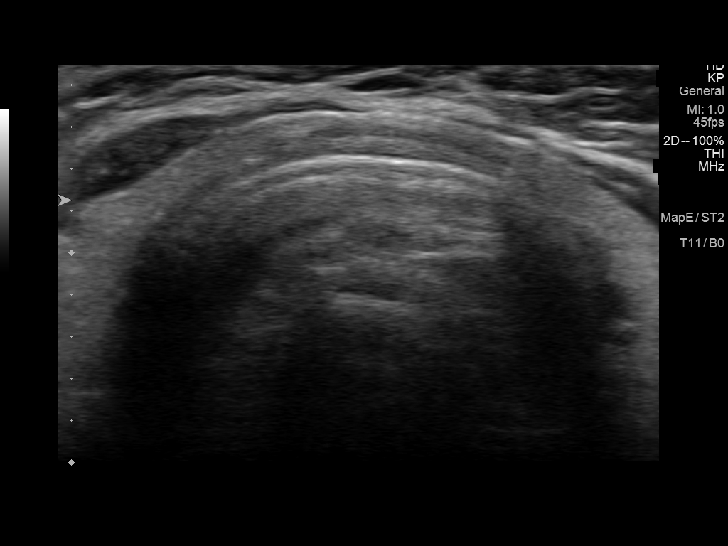
[im 5/51]
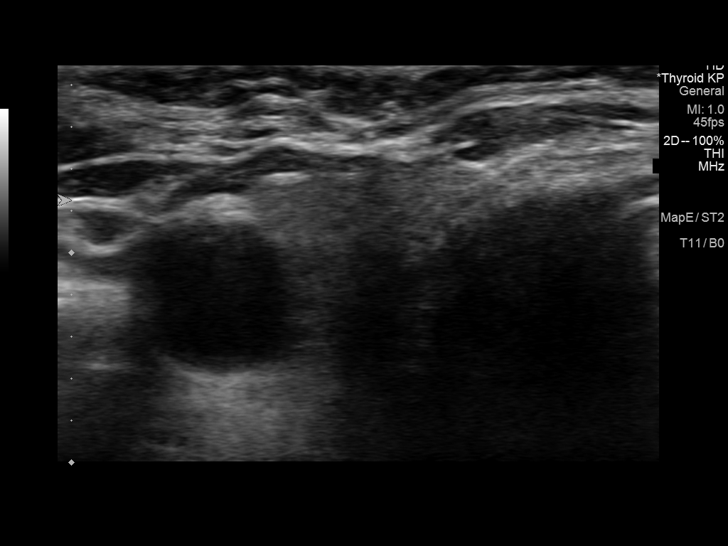
[im 9/51]
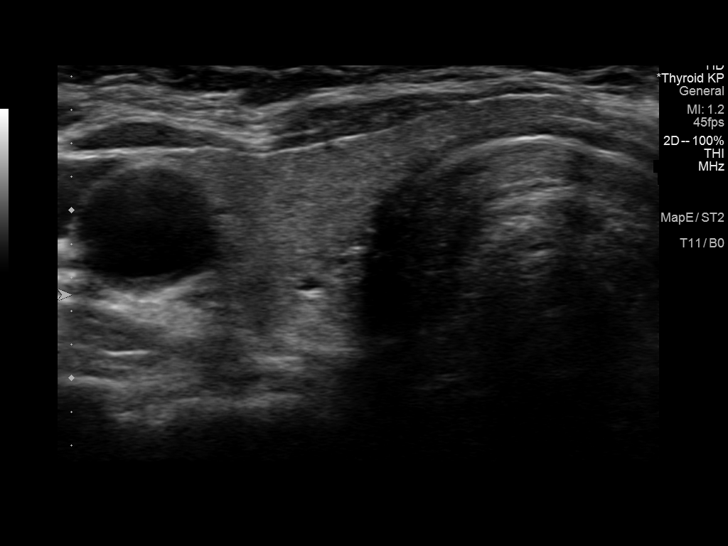
[im 13/51]
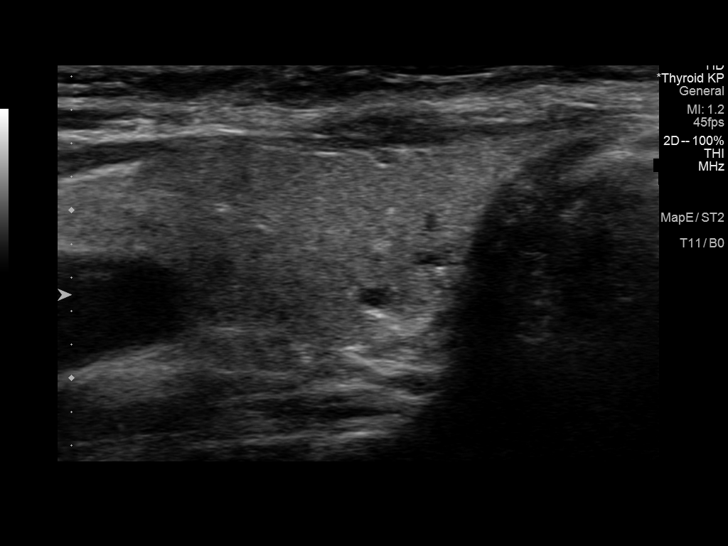
[im 17/51]
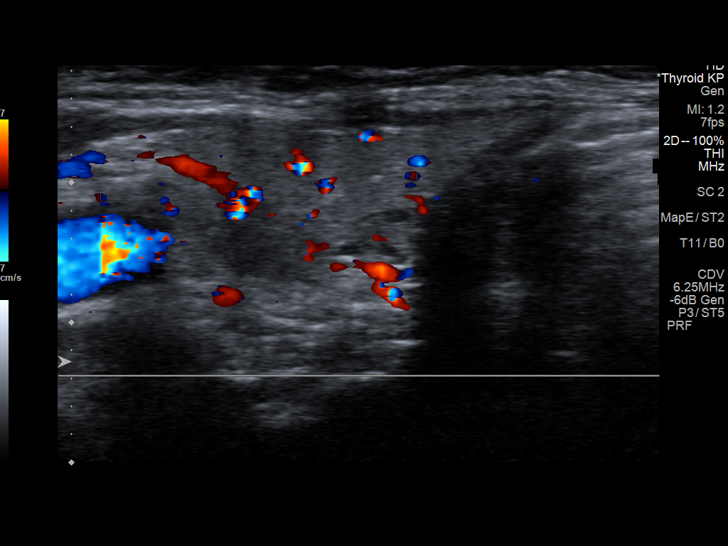
[im 19/51]
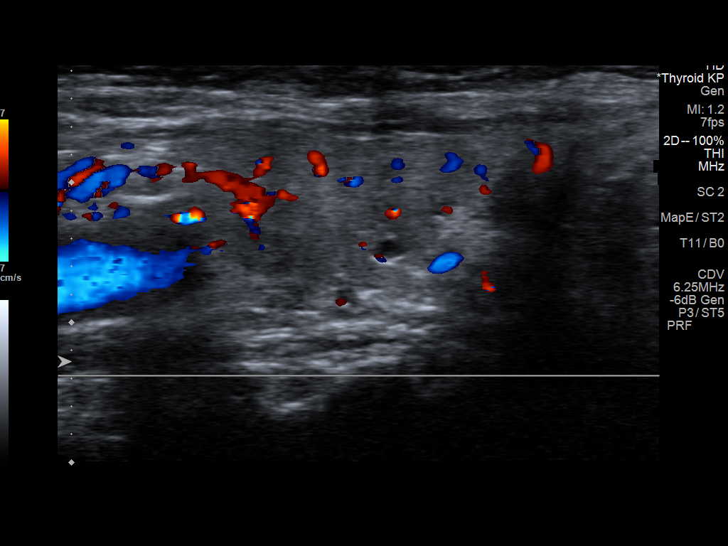
[im 23/51]
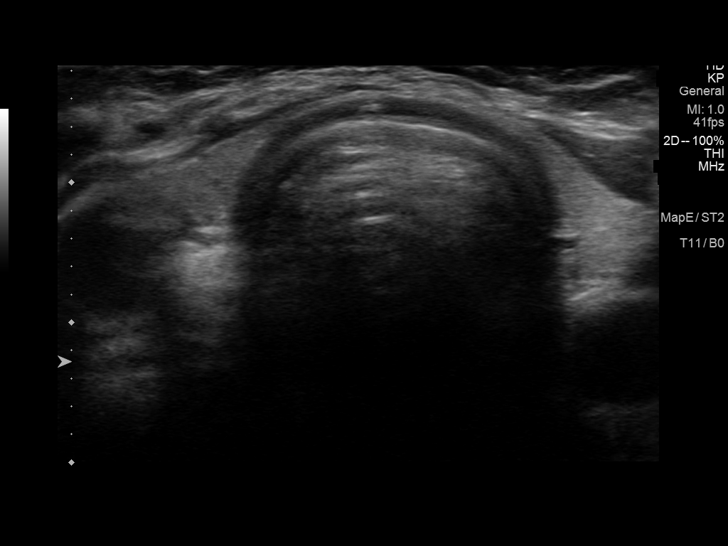
[im 28/51]
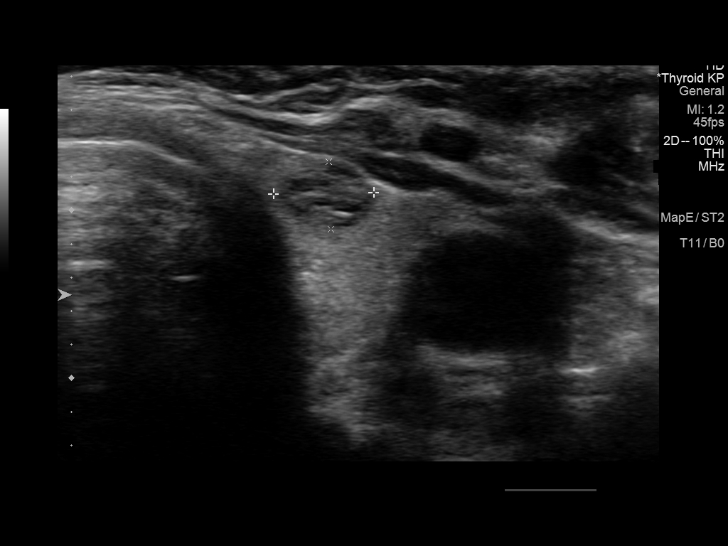
[im 32/51]
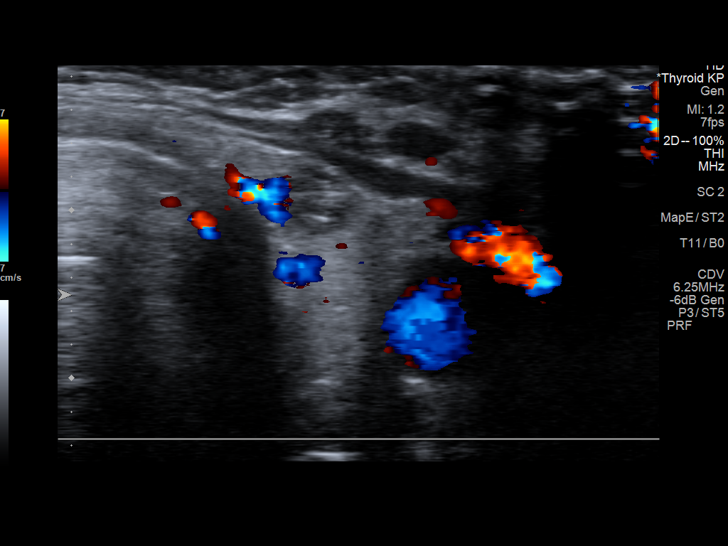
[im 34/51]
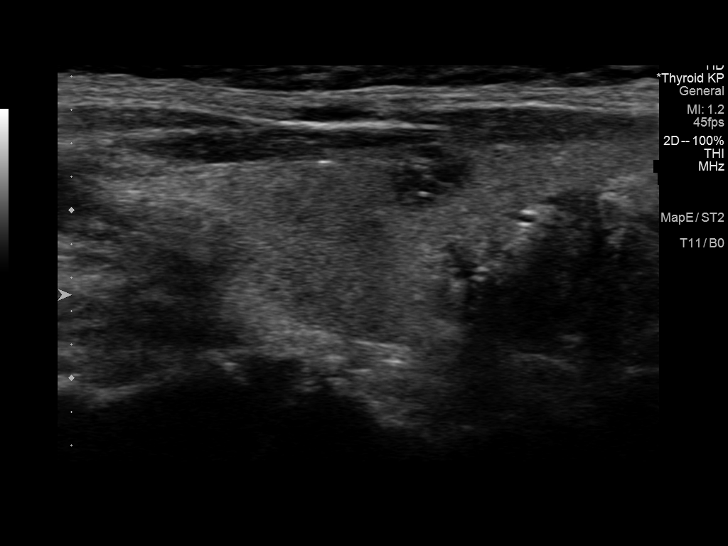
[im 38/51]
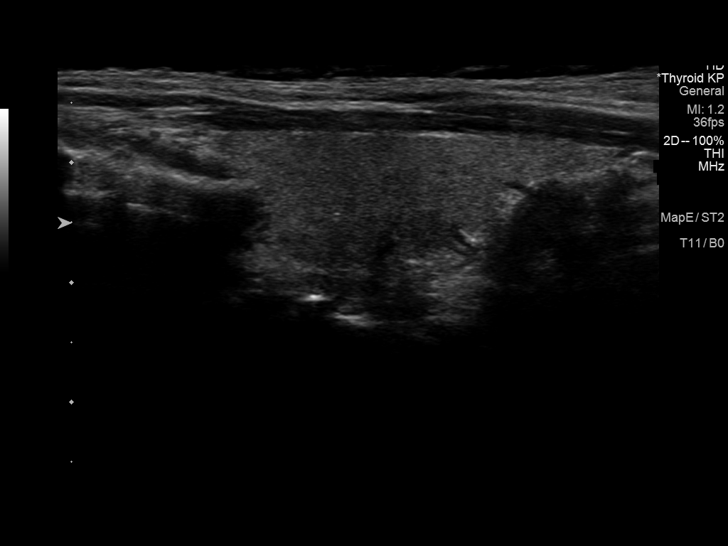
[im 42/51]
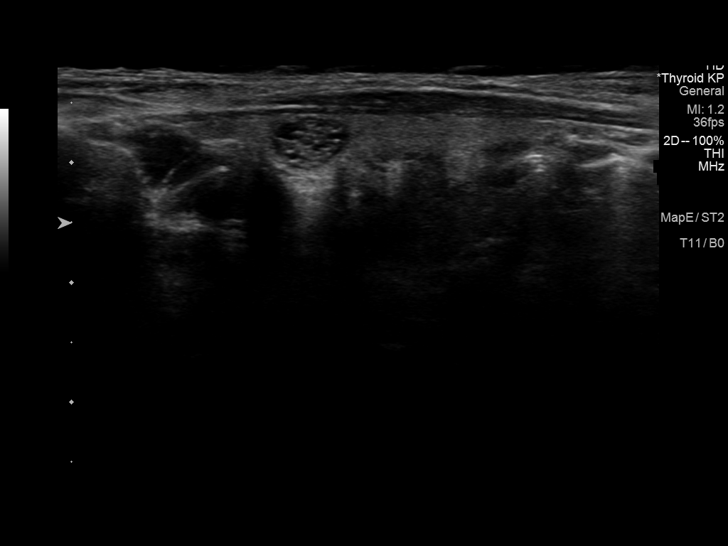
[im 46/51]
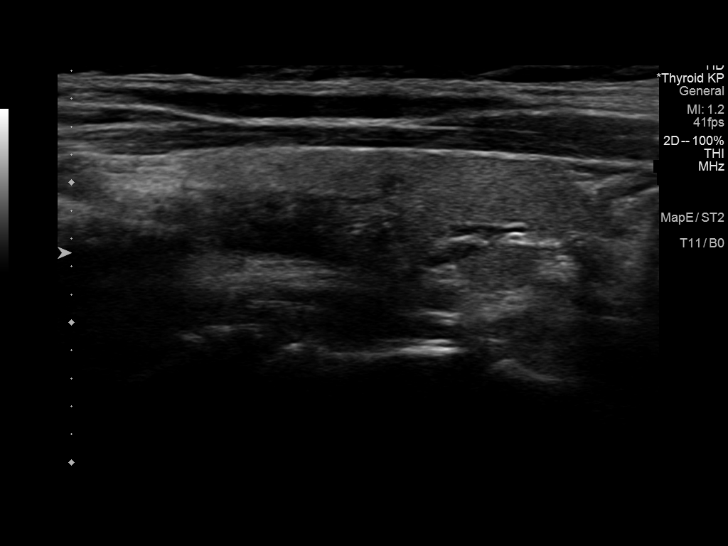
[im 51/51]
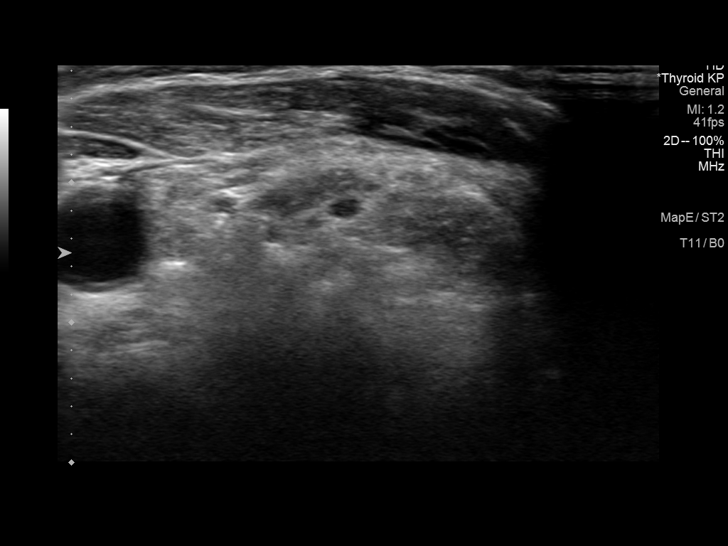

[14 of 25 positions shown; findings below may reference images not displayed]

FINDINGS: Parenchymal Echotexture: Mildly heterogenous

Isthmus: 0.1 cm

Right lobe: 3.3 x 1.1 x 1.3 cm

Left lobe: 3.4 x 1.1 x 1.1 cm

_________________________________________________________

Estimated total number of nodules >/= 1 cm: 0

Number of spongiform nodules >/=  2 cm not described below (TR1): 0

Number of mixed cystic and solid nodules >/= 1.5 cm not described
below (TR2): 0

_________________________________________________________

No discrete nodules of consequence are seen within the thyroid
gland. Incidental note is made of a small 0.6 cm spongiform nodule
in the left mid gland. This is considered sonographically benign and
no further follow-up is recommended.
IMPRESSION: Normal sonographic appearance of the thyroid gland.

The above is in keeping with the ACR TI-RADS recommendations - [HOSPITAL] 7767;[DATE].

## 2021-08-01 IMAGING — CT CT HEAD W/O CM
4 series · 16 of 47 positions shown, 18 images · non-contrast
Comparison: Brain MRI 05/28/2020.  Head CT 06/27/2016.

CLINICAL DATA: Mental status change, unknown cause.

EXAM:
CT HEAD WITHOUT CONTRAST
TECHNIQUE: Contiguous axial images were obtained from the base of the skull
through the vertex without intravenous contrast.

[Series 3: head without · axial · non-contrast · 0.44mm/px · z∈[-118,+2]mm · 7 of 32 slices shown, 9 images]
[im 4/32  brain]
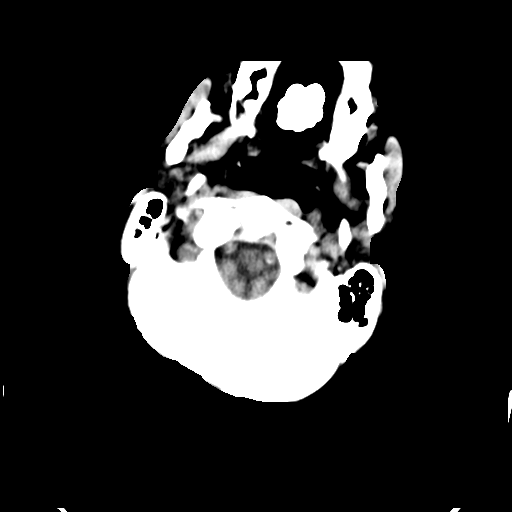
[im 4/32  bone]
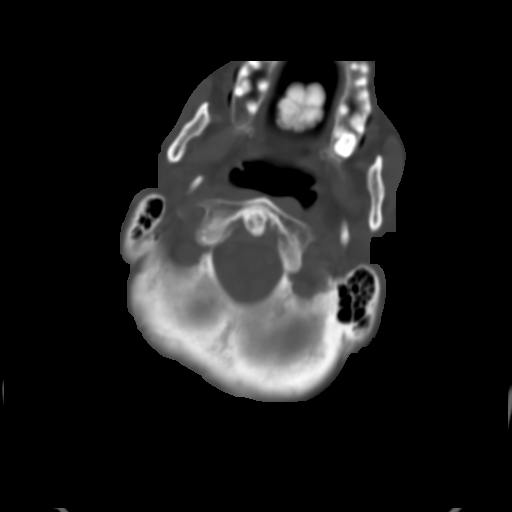
[im 8/32  brain]
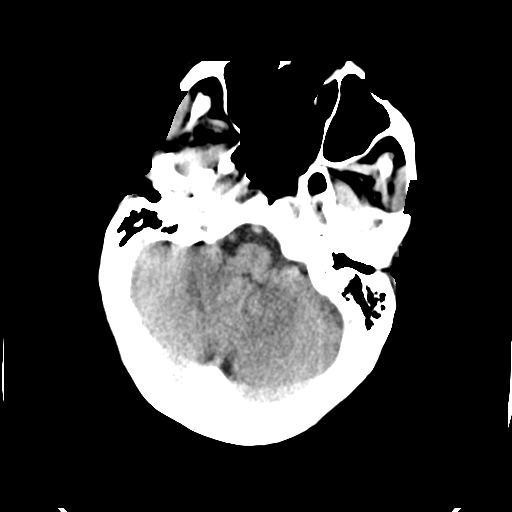
[im 12/32  brain]
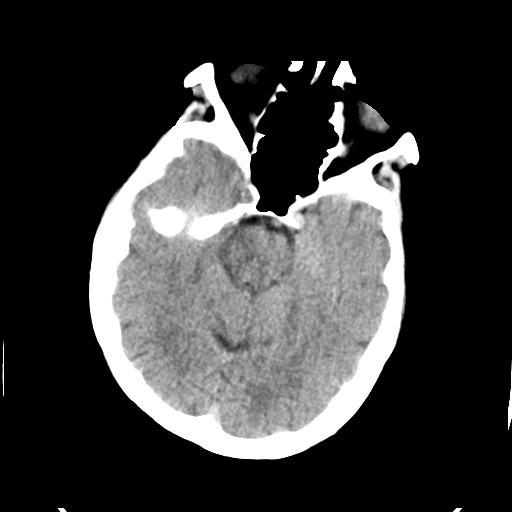
[im 16/32  brain]
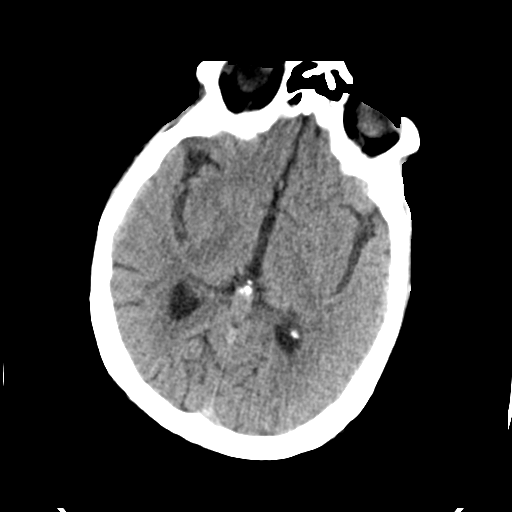
[im 20/32  brain]
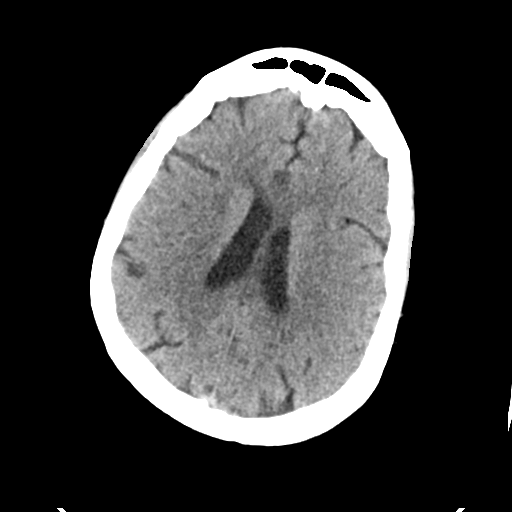
[im 20/32  bone]
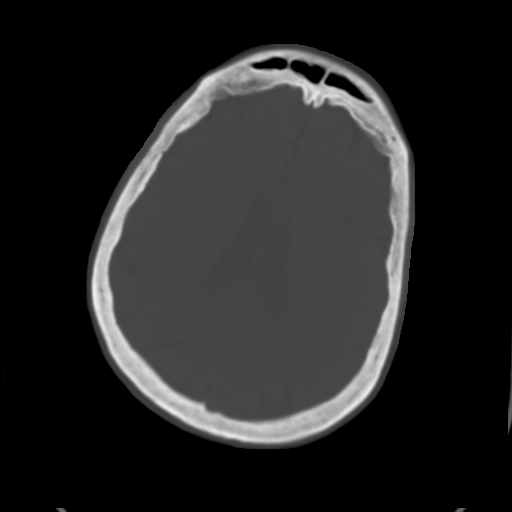
[im 24/32  brain]
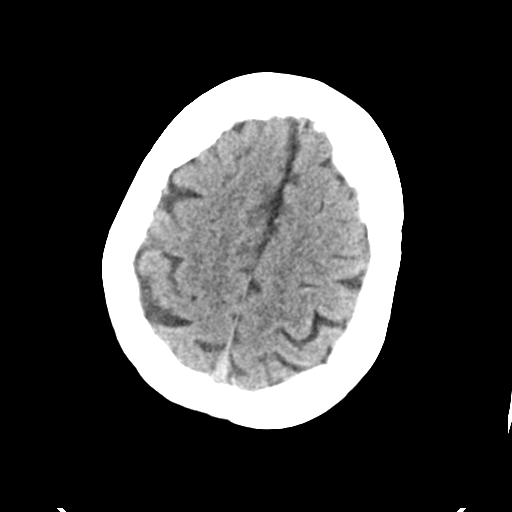
[im 28/32  brain]
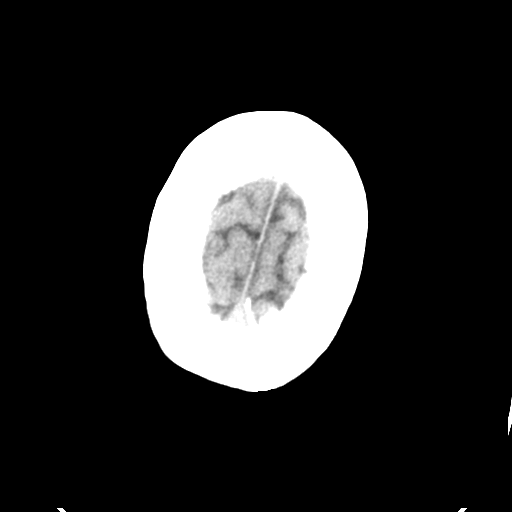

[Series 4: head bone · axial · 0.44mm/px · z∈[-118,-86]mm · 3 of 79 slices shown]
[im 8/79  bone]
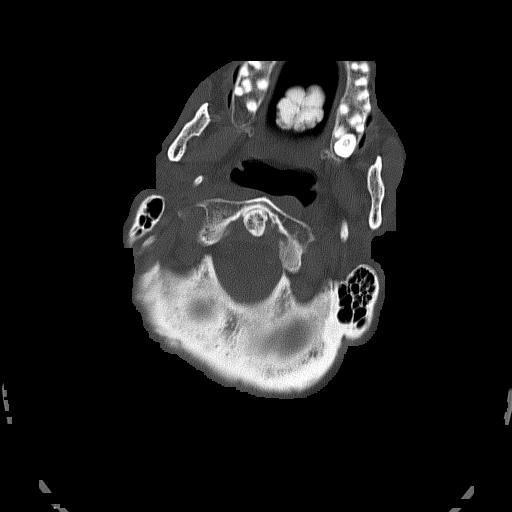
[im 16/79  bone]
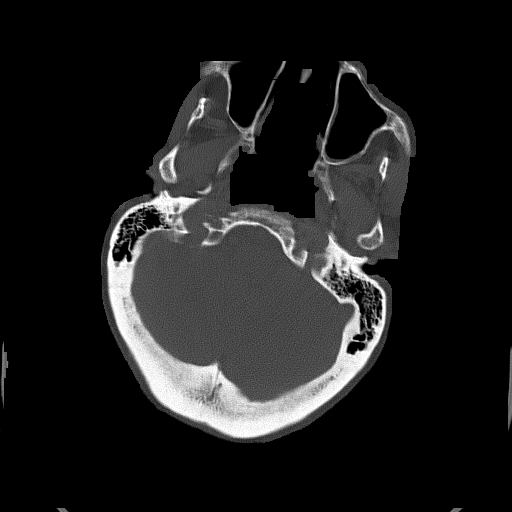
[im 24/79  bone]
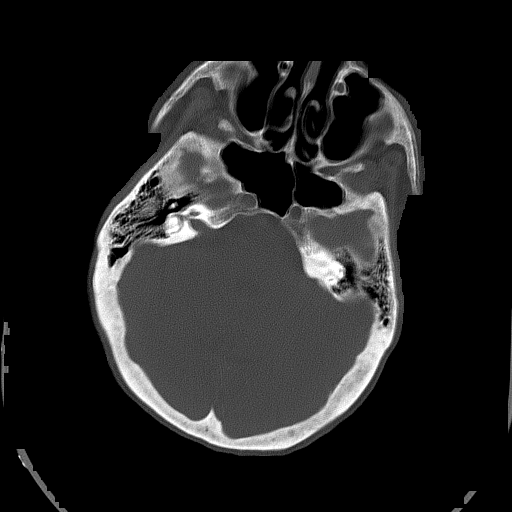

[Series 5: head without cor · coronal · non-contrast · 0.32mm/px · 3 of 67 slices shown]
[im 23/67  brain]
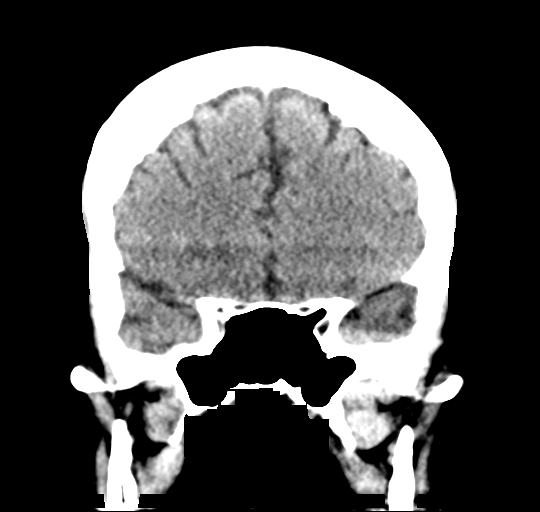
[im 30/67  brain]
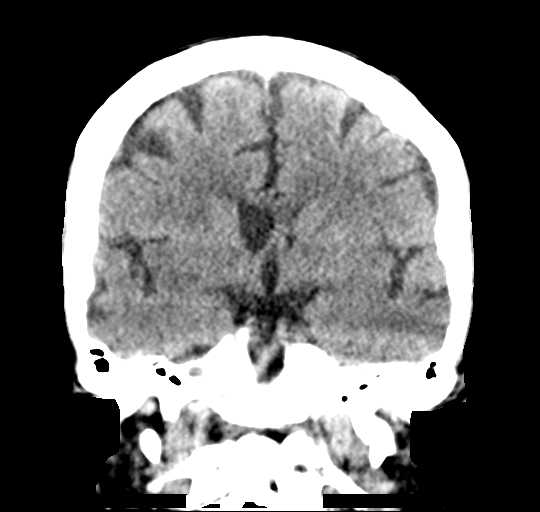
[im 37/67  brain]
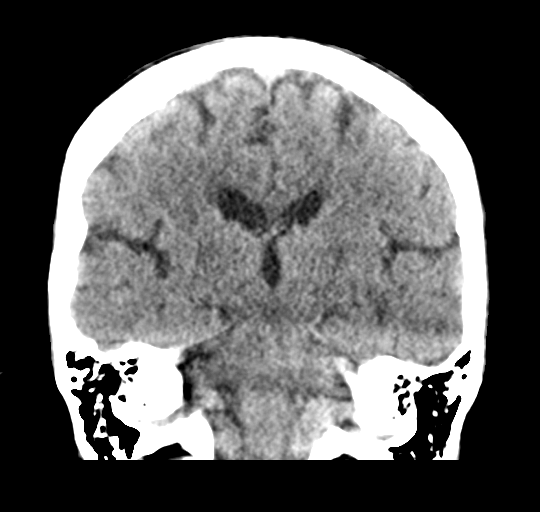

[Series 6: head without sag · sagittal · non-contrast · 0.32mm/px · 3 of 61 slices shown]
[im 21/61  brain]
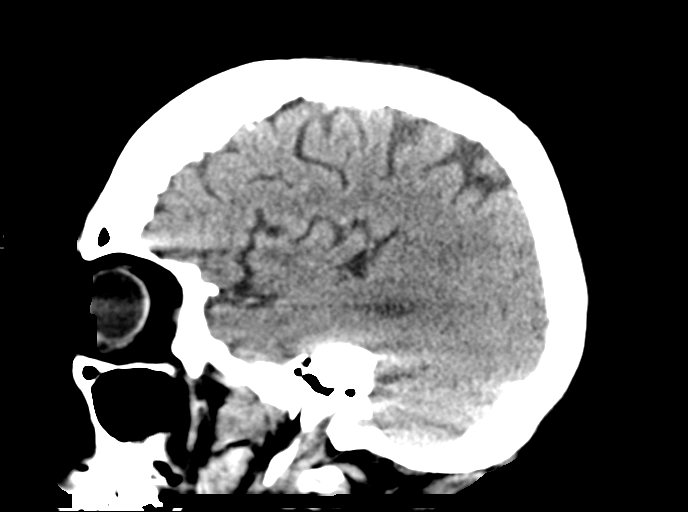
[im 31/61  brain]
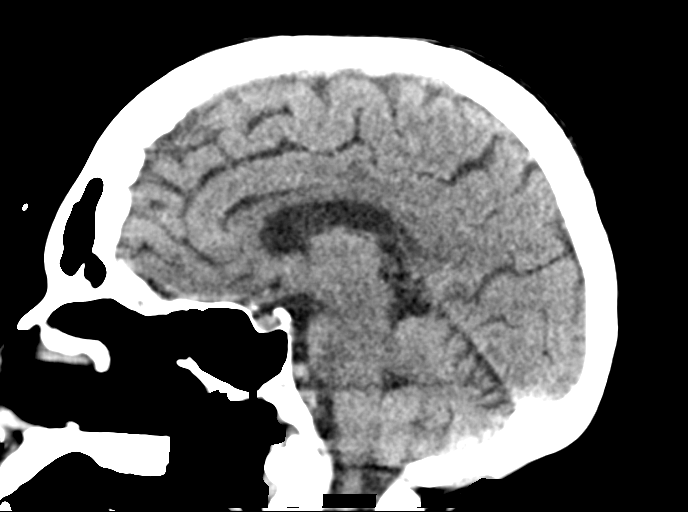
[im 41/61  brain]
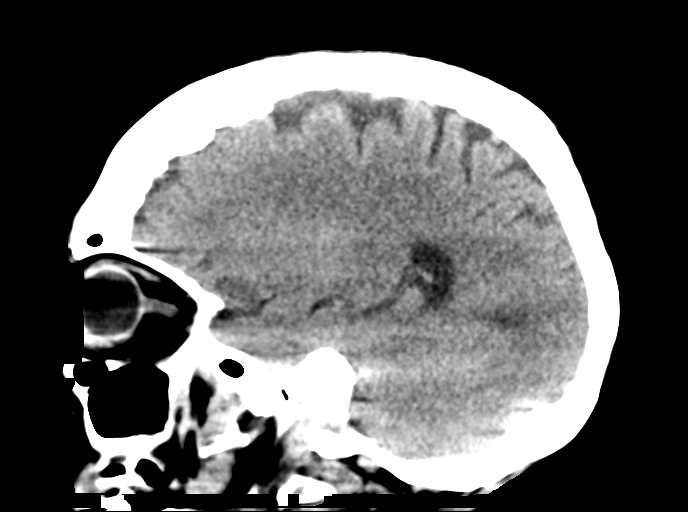

[16 of 47 positions shown; findings below may reference images not displayed]

FINDINGS: Brain:

Mildly motion degraded exam.

Mild cerebral and cerebellar atrophy.

Mild ill-defined hypoattenuation within the cerebral white matter is
nonspecific, but compatible with chronic small vessel ischemic
disease.

Known chronic infarct within the right pons.

There is no acute intracranial hemorrhage.

No demarcated cortical infarct.

No extra-axial fluid collection.

No evidence of intracranial mass.

No midline shift.

Vascular: No hyperdense vessel.  Atherosclerotic calcifications.

Skull: No calvarial fracture 10 mm exostosis arising from the right
frontal calvarium (series 4, image 63).

Sinuses/Orbits: Visualized orbits show no acute finding. Mild left
ethmoid sinus mucosal thickening.
IMPRESSION: No evidence of acute intracranial abnormality.

Known chronic infarct within the right pons.

Background mild generalized atrophy of the brain and chronic small
vessel ischemic disease.

10 mm bony exostosis arising from the right frontal calvarium.

Mild left ethmoid sinus mucosal thickening.

## 2021-08-01 IMAGING — DX DG CHEST 1V PORT
1 series · 1 of 1 positions shown · non-contrast
Comparison: 05/26/2020

CLINICAL DATA: Weakness

EXAM:
PORTABLE CHEST 1 VIEW

[chest ap]
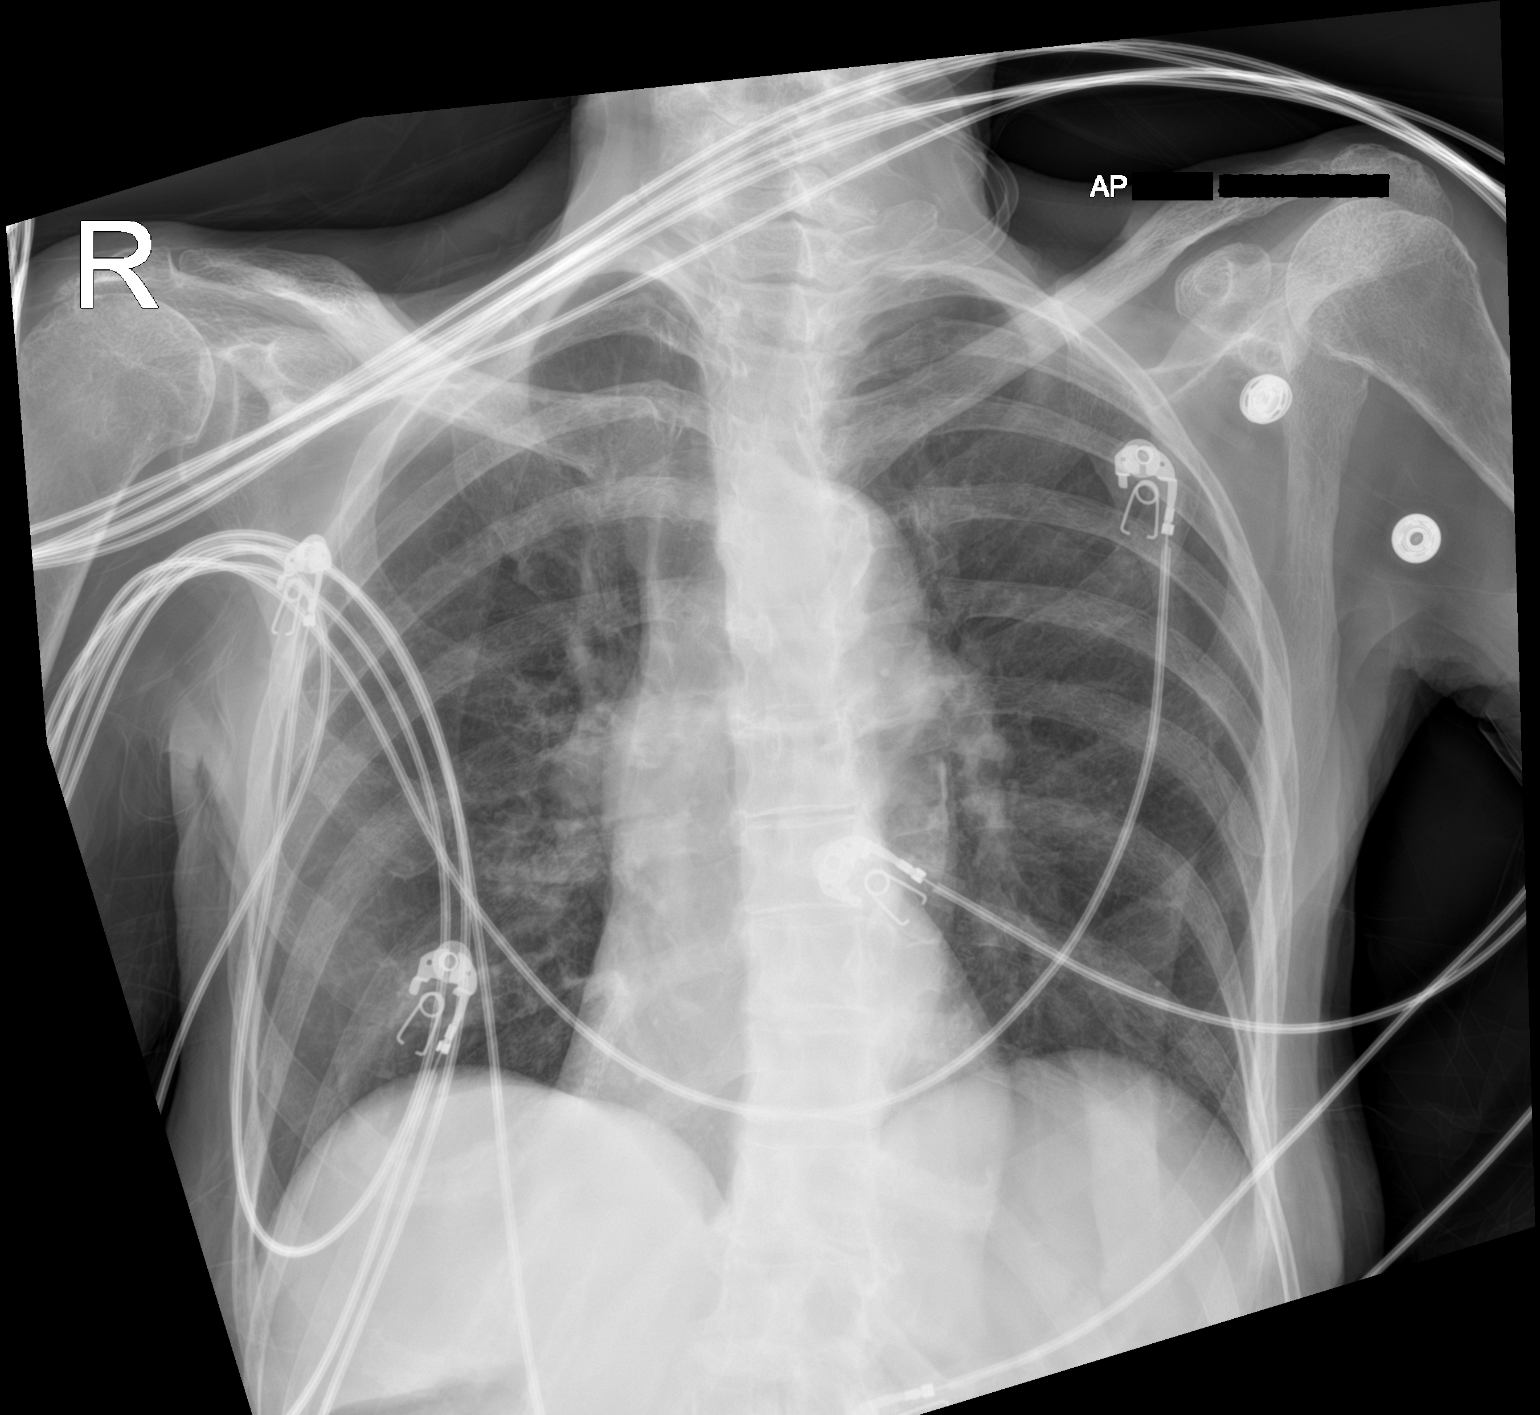

[1 of 1 positions shown; findings below may reference images not displayed]

FINDINGS: The heart size and mediastinal contours are within normal limits.
Both lungs are clear. The visualized skeletal structures are
unremarkable.
IMPRESSION: No acute abnormality of the lungs in AP portable projection.
